# Patient Record
Sex: Female | Born: 1940
Health system: Southern US, Community
[De-identification: ages and names within clinical notes are randomized; demographics above are authoritative.]

## PROBLEM LIST (undated history)

## (undated) DIAGNOSIS — K635 Polyp of colon: Secondary | ICD-10-CM

## (undated) DIAGNOSIS — E119 Type 2 diabetes mellitus without complications: Secondary | ICD-10-CM

## (undated) DIAGNOSIS — Z9289 Personal history of other medical treatment: Secondary | ICD-10-CM

## (undated) DIAGNOSIS — E039 Hypothyroidism, unspecified: Secondary | ICD-10-CM

## (undated) DIAGNOSIS — E785 Hyperlipidemia, unspecified: Secondary | ICD-10-CM

## (undated) DIAGNOSIS — D126 Benign neoplasm of colon, unspecified: Secondary | ICD-10-CM

## (undated) DIAGNOSIS — I10 Essential (primary) hypertension: Secondary | ICD-10-CM

## (undated) HISTORY — DX: Benign neoplasm of colon, unspecified: D12.6

## (undated) HISTORY — DX: Hyperlipidemia, unspecified: E78.5

## (undated) HISTORY — DX: Personal history of other medical treatment: Z92.89

## (undated) HISTORY — DX: Essential (primary) hypertension: I10

## (undated) HISTORY — DX: Hypothyroidism, unspecified: E03.9

---

## 1973-06-25 HISTORY — PX: APPENDECTOMY: SHX54

## 1973-06-25 HISTORY — PX: ABDOMINAL HYSTERECTOMY: SHX81

## 2009-04-25 DIAGNOSIS — D126 Benign neoplasm of colon, unspecified: Secondary | ICD-10-CM

## 2009-04-25 HISTORY — DX: Benign neoplasm of colon, unspecified: D12.6

## 2009-04-30 LAB — HM COLONOSCOPY

## 2009-05-10 ENCOUNTER — Ambulatory Visit: Payer: Self-pay | Admitting: Gastroenterology

## 2009-07-07 ENCOUNTER — Inpatient Hospital Stay: Payer: Self-pay | Admitting: Specialist

## 2009-09-12 ENCOUNTER — Ambulatory Visit: Payer: Self-pay | Admitting: Gastroenterology

## 2009-09-23 LAB — HM COLONOSCOPY: HM Colonoscopy: NORMAL

## 2010-05-04 LAB — HM COLONOSCOPY

## 2010-10-24 DIAGNOSIS — Z9289 Personal history of other medical treatment: Secondary | ICD-10-CM

## 2010-10-24 HISTORY — DX: Personal history of other medical treatment: Z92.89

## 2010-11-02 ENCOUNTER — Ambulatory Visit: Payer: Self-pay | Admitting: Internal Medicine

## 2010-12-24 LAB — HM MAMMOGRAPHY: HM Mammogram: NORMAL

## 2011-03-07 ENCOUNTER — Other Ambulatory Visit: Payer: Self-pay | Admitting: Internal Medicine

## 2011-03-08 MED ORDER — SIMVASTATIN 40 MG PO TABS: 40.0000 mg | ORAL_TABLET | Freq: Every evening | ORAL | Status: DC

## 2011-03-08 MED ORDER — LEVOTHYROXINE SODIUM 88 MCG PO TABS: 88.0000 ug | ORAL_TABLET | Freq: Every day | ORAL | Status: DC

## 2011-03-08 MED ORDER — LISINOPRIL 20 MG PO TABS: 20.0000 mg | ORAL_TABLET | Freq: Every day | ORAL | Status: DC

## 2011-07-12 ENCOUNTER — Telehealth: Payer: Self-pay | Admitting: *Deleted

## 2011-07-12 NOTE — Telephone Encounter (Signed)
Fax from [facility] is in your red folder.  They are asking to change from brand synthroid to levothyroxine.

## 2011-07-13 NOTE — Telephone Encounter (Signed)
Form faxed back to [facility]. 

## 2011-09-24 ENCOUNTER — Ambulatory Visit (INDEPENDENT_AMBULATORY_CARE_PROVIDER_SITE_OTHER): Payer: Medicare Other | Admitting: Internal Medicine

## 2011-09-24 ENCOUNTER — Encounter: Payer: Self-pay | Admitting: Internal Medicine

## 2011-09-24 VITALS — BP 146/62 | HR 118 | Temp 99.0°F | Resp 18 | Ht 66.0 in | Wt 202.2 lb

## 2011-09-24 DIAGNOSIS — I10 Essential (primary) hypertension: Secondary | ICD-10-CM | POA: Insufficient documentation

## 2011-09-24 DIAGNOSIS — J069 Acute upper respiratory infection, unspecified: Secondary | ICD-10-CM | POA: Insufficient documentation

## 2011-09-24 DIAGNOSIS — J329 Chronic sinusitis, unspecified: Secondary | ICD-10-CM

## 2011-09-24 DIAGNOSIS — E785 Hyperlipidemia, unspecified: Secondary | ICD-10-CM | POA: Insufficient documentation

## 2011-09-24 DIAGNOSIS — Z79899 Other long term (current) drug therapy: Secondary | ICD-10-CM

## 2011-09-24 DIAGNOSIS — E039 Hypothyroidism, unspecified: Secondary | ICD-10-CM | POA: Insufficient documentation

## 2011-09-24 DIAGNOSIS — E669 Obesity, unspecified: Secondary | ICD-10-CM

## 2011-09-24 MED ORDER — GUAIFENESIN-CODEINE 100-10 MG/5ML PO SYRP: 5.0000 mL | ORAL_SOLUTION | Freq: Three times a day (TID) | ORAL | Status: AC | PRN

## 2011-09-24 MED ORDER — AMOXICILLIN-POT CLAVULANATE 875-125 MG PO TABS: 1.0000 | ORAL_TABLET | Freq: Two times a day (BID) | ORAL | Status: AC

## 2011-09-24 NOTE — Progress Notes (Signed)
Patient ID: Marissa Rodriguez, female   DOB: [birth year], 71 y.o.   MRN: [id number]    Patient Active Problem List  Diagnoses  . Hyperlipidemia  . Upper respiratory infection  . Hypertension  . Hypothyroidism  . Other and unspecified hyperlipidemia    Subjective:  CC:   Chief Complaint  Patient presents with  . Cough    HPI:   Marissa Rodriguez a 70 y.o. female who presents   4 days of malaise, subjective fevers in late afternoon, hard cough with post tussive emesis, no appetite,  Some productive cough. Sinus congestion,  Some drainage and PND.  Positive sick contacts.  Young Information systems manager.  Taking Delsym and Mucinex DM without relief.  Coughing every 30 seconds.  Cough is  Keeping her up at night .     Past Medical History  Diagnosis Date  . Hypertension   . Hyperlipidemia     Past Surgical History  Procedure Date  . Appendectomy 1975  . Abdominal hysterectomy 1975         The following portions of the patient's history were reviewed and updated as appropriate: Allergies, current medications, and problem list.    Review of Systems:   12 Pt  review of systems was negative except those addressed in the HPI,     History   Social History  . Marital Status: Married    Spouse Name: N/A    Number of Children: N/A  . Years of Education: N/A   Occupational History  . Not on file.   Social History Main Topics  . Smoking status: Never Smoker   . Smokeless tobacco: Never Used  . Alcohol Use: No  . Drug Use: No  . Sexually Active: Not on file   Other Topics Concern  . Not on file   Social History Narrative  . No narrative on file    Objective:  BP 146/62  Pulse 118  Temp(Src) 99 F (37.2 C) (Oral)  Resp 18  Ht 5\' 6"  (1.676 m)  Wt 202 lb 4 oz (91.74 kg)  BMI 32.64 kg/m2  SpO2 96%  General appearance: alert, cooperative and appears stated age Ears: normal TM's and external ear canals both ears Throat: lips, mucosa, and tongue normal; teeth and gums  normal Neck: no adenopathy, no carotid bruit, supple, symmetrical, trachea midline and thyroid not enlarged, symmetric, no tenderness/mass/nodules Back: symmetric, no curvature. ROM normal. No CVA tenderness. Lungs: clear to auscultation bilaterally Heart: regular rate and rhythm, S1, S2 normal, no murmur, click, rub or gallop Abdomen: soft, non-tender; bowel sounds normal; no masses,  no organomegaly Pulses: 2+ and symmetric Skin: Skin color, texture, turgor normal. No rashes or lesions Lymph nodes: Cervical, supraclavicular, and axillary nodes normal.  Assessment and Plan:  Upper respiratory infection Exam is consistent with viral etiology currently. Will treat with suportive care and cheratussing cough syrup to manage cough.  Add augmentin if no improvement in 2-3 days   Other and unspecified hyperlipidemia She has been taking a statin for over eight months without follow up liver function tests. Goal LDL is < 100  Hypothyroidism She needs a TSH level checked as it has been 8 months   Hypertension Not under good control today but she is acutely ill.  Will return in 3 months for repeat evaluation .,    Updated Medication List Outpatient Encounter Prescriptions as of 09/24/2011  Medication Sig Dispense Refill  . levothyroxine (SYNTHROID) 88 MCG tablet Take 1 tablet (88 mcg total) by mouth daily.  90 tablet  3  . lisinopril (PRINIVIL,ZESTRIL) 20 MG tablet Take 1 tablet (20 mg total) by mouth daily.  90 tablet  3  . simvastatin (ZOCOR) 40 MG tablet Take 1 tablet (40 mg total) by mouth every evening.  90 tablet  3  . amoxicillin-clavulanate (AUGMENTIN) 875-125 MG per tablet Take 1 tablet by mouth 2 (two) times daily.  14 tablet  0  . guaiFENesin-codeine (ROBITUSSIN AC) 100-10 MG/5ML syrup Take 5 mLs by mouth 3 (three) times daily as needed for cough.  240 mL  0     Orders Placed This Encounter  Procedures  . MyChart Weight Flowsheet  . HM MAMMOGRAPHY  . TSH  . Lipid panel  .  COMPLETE METABOLIC PANEL WITH GFR  . HM COLONOSCOPY    Return in about 2 months (around 11/24/2011).

## 2011-09-24 NOTE — Assessment & Plan Note (Signed)
Not under good control today but she is acutely ill.  Will return in 3 months for repeat evaluation .,

## 2011-09-24 NOTE — Assessment & Plan Note (Signed)
She has been taking a statin for over eight months without follow up liver function tests. Goal LDL is < 100

## 2011-09-24 NOTE — Patient Instructions (Signed)
You have a viral  Syndrome .  The post nasal drip is causing your sore throat.  Lavage your sinuses twice daly with Simply saline nasal spray.  Use benadryl 25 mg every 8 hours and Sudafed PE 10 to 30 every 8 hours to manage the drainage and congestion.  Gargle with salt water often for the sore throat.  I am calling in Cheratussin cough syrup (has codeine) for the cough.  If the cough is no better  In 3 to 4 days OR  if you develop T > 100.4,  Green or brown nasal discharge,  Or facial pain, start the antibiotic.

## 2011-09-24 NOTE — Assessment & Plan Note (Signed)
Exam is consistent with viral etiology currently. Will treat with suportive care and cheratussing cough syrup to manage cough.  Add augmentin if no improvement in 2-3 days

## 2011-09-24 NOTE — Assessment & Plan Note (Signed)
She needs a TSH level checked as it has been 8 months

## 2011-10-02 ENCOUNTER — Emergency Department: Payer: Self-pay | Admitting: Emergency Medicine

## 2011-10-02 LAB — COMPREHENSIVE METABOLIC PANEL WITH GFR
Albumin: 3.3 g/dL — ABNORMAL LOW
Alkaline Phosphatase: 69 U/L
Anion Gap: 9
BUN: 22 mg/dL — ABNORMAL HIGH
Bilirubin,Total: 0.2 mg/dL
Calcium, Total: 9.5 mg/dL
Chloride: 106 mmol/L
Co2: 28 mmol/L
Creatinine: 1.11 mg/dL
EGFR (African American): >60
EGFR (Non-African Amer.): 52 — ABNORMAL LOW
Glucose: 106 mg/dL — ABNORMAL HIGH
Osmolality: 289
Potassium: 4.2 mmol/L
SGOT(AST): 30 U/L
SGPT (ALT): 23 U/L
Sodium: 143 mmol/L
Total Protein: 8.8 g/dL — ABNORMAL HIGH

## 2011-10-02 LAB — CBC
HCT: 37.4 %
HGB: 12.5 g/dL
MCH: 28.8 pg
MCHC: 33.4 g/dL
MCV: 86 fL
Platelet: 388 10*3/uL
RBC: 4.34 X10 6/mm 3
RDW: 12.4 %
WBC: 9.7 10*3/uL

## 2011-10-02 LAB — TROPONIN I: Troponin-I: <0.02 ng/mL

## 2011-10-11 ENCOUNTER — Encounter: Payer: Self-pay | Admitting: Internal Medicine

## 2011-10-11 ENCOUNTER — Ambulatory Visit (INDEPENDENT_AMBULATORY_CARE_PROVIDER_SITE_OTHER): Payer: Medicare Other | Admitting: Internal Medicine

## 2011-10-11 VITALS — BP 154/76 | HR 80 | Temp 97.6°F | Resp 18 | Wt 196.2 lb

## 2011-10-11 DIAGNOSIS — J189 Pneumonia, unspecified organism: Secondary | ICD-10-CM

## 2011-10-11 DIAGNOSIS — E039 Hypothyroidism, unspecified: Secondary | ICD-10-CM

## 2011-10-11 DIAGNOSIS — I8510 Secondary esophageal varices without bleeding: Secondary | ICD-10-CM

## 2011-10-11 DIAGNOSIS — R05 Cough: Secondary | ICD-10-CM

## 2011-10-11 DIAGNOSIS — E669 Obesity, unspecified: Secondary | ICD-10-CM

## 2011-10-11 DIAGNOSIS — R059 Cough, unspecified: Secondary | ICD-10-CM

## 2011-10-11 DIAGNOSIS — J069 Acute upper respiratory infection, unspecified: Secondary | ICD-10-CM

## 2011-10-11 MED ORDER — PREDNISONE (PAK) 10 MG PO TABS: ORAL_TABLET | ORAL | Status: AC

## 2011-10-11 MED ORDER — HYDROCOD POLST-CHLORPHEN POLST 10-8 MG/5ML PO LQCR: 5.0000 mL | Freq: Two times a day (BID) | ORAL | Status: DC | PRN

## 2011-10-11 MED ORDER — DOXYCYCLINE HYCLATE 100 MG PO TABS: 100.0000 mg | ORAL_TABLET | Freq: Two times a day (BID) | ORAL | Status: AC

## 2011-10-11 NOTE — Patient Instructions (Signed)
I am prescribing an antibiotic (doxycycline) and prednisone taper  To manage the infection and the inflammation in your ear/sinuses.   I also advise use of the following OTC meds to help with your other symptoms.   Take generic OTC benadryl 25 mg every 8 hours for the  post nasal drip,  Sudafed PE  10 to 30 mg every 8 hours for the congestion, you may substitute Afrin nasal spray for the nighttime dose of sudafed PE  If needed to prevent insomnia.  flushes your sinuses twice daily with Simply Saline (do over the sink because if you do it right you will spit out globs of mucus)  Use  tussionex  twice daily for the cough (it is sedating)  1 teaspoon every 12 hours    Gargle with salt water as needed for sore throat.

## 2011-10-11 NOTE — Progress Notes (Signed)
Patient ID: Marissa Rodriguez, female   DOB: [birth year], 71 y.o.   MRN: [id number]  Patient Active Problem List  Diagnoses  . Hyperlipidemia  . Upper respiratory infection  . Hypertension  . Hypothyroidism  . Other and unspecified hyperlipidemia  . History of MRI of brain and brain stem  . Tubular adenoma of colon  . Pneumonia  . Obesity (BMI 30-39.9)  . Secondary esophageal varices without bleeding    Subjective:  CC:   Chief Complaint  Patient presents with  . Follow-up    HPI:   Marissa Rodriguez a 71 y.o. female who presents Follow up on RML pneumonia, diagnosed in ER with CXR on April 9th.  After symptoms of viral upper respiratory infection progressed despite use of Augmentin prescribed by me for progression of symptoms.  her vital signs are stable so she was not admitted but was discharged from ER with a prescription for Levaquin,  which she has finished taking. She feels generally better. She continues to have low-grade fevers and productive cough as well as wheezing..  she is not endorsing any pleuritic chest pain and is not short of breath. She continues to have persistent recurrent cough, which is at times productive accompanied by sinus congestion, malaise and hoarseness.   Past Medical History  Diagnosis Date  . Hypertension   . Hyperlipidemia   . History of MRI of brain and brain stem May 2012    normal MRI/MRA done to rule out CVA  . Tubular adenoma of colon Nov 2010    repeat due 2015  . Hypothyroidism     Past Surgical History  Procedure Date  . Abdominal hysterectomy 1975    TAH/LSO, wedge resection of right  . Appendectomy 1975    incidental, done during TAH         The following portions of the patient's history were reviewed and updated as appropriate: Allergies, current medications, and problem list.    Review of Systems:   12 Pt  review of systems was negative except those addressed in the HPI,     History   Social History  . Marital  Status: Married    Spouse Name: N/A    Number of Children: N/A  . Years of Education: N/A   Occupational History  . Not on file.   Social History Main Topics  . Smoking status: Never Smoker   . Smokeless tobacco: Never Used  . Alcohol Use: No  . Drug Use: No  . Sexually Active: Not on file   Other Topics Concern  . Not on file   Social History Narrative  . No narrative on file    Objective:  BP 154/76  Pulse 80  Temp(Src) 97.6 F (36.4 C) (Oral)  Resp 18  Wt 196 lb 4 oz (89.018 kg)  SpO2 95%  General appearance: alert, cooperative and appears stated age Ears: normal TM's and external ear canals both ears Throat: lips, mucosa, and tongue normal; teeth and gums normal Neck: no adenopathy, no carotid bruit, supple, symmetrical, trachea midline and thyroid not enlarged, symmetric, no tenderness/mass/nodules Back: symmetric, no curvature. ROM normal. No CVA tenderness. Lungs: clear to auscultation bilaterally Heart: regular rate and rhythm, S1, S2 normal, no murmur, click, rub or gallop Abdomen: soft, non-tender; bowel sounds normal; no masses,  no organomegaly Pulses: 2+ and symmetric Skin: Skin color, texture, turgor normal. No rashes or lesions Lymph nodes: Cervical, supraclavicular, and axillary nodes normal.  Assessment and Plan:  Upper respiratory infection Review of  the evaluation of April 9 in the ER was notable for a normal CBC and a RML infiltrate. She has finished the levaquin but continues to havre productive cough and malaise.  Prescribed doxycycline and advised to saline lavage, saline gargles and Benadryl/Sudafed PE for management of sinus congestion  Pneumonia Right middle lobe infiltrate by April 9 chest x-ray done at [facility]. She will need a repeat chest x-ray in 6 weeks to ensure resolution.  Obesity (BMI 30-39.9) She continues to lose weight steadily using dietary modifications and regular exercise using the [employer] lifestyle community exercise  program. She has lost 6 pounds since her visit in June. Encouragement provide;d low glycemic index diet reviewed  Hypothyroidism Managed with generic Synthroid. TSH is due.    Updated Medication List Outpatient Encounter Prescriptions as of 10/11/2011  Medication Sig Dispense Refill  . levothyroxine (SYNTHROID) 88 MCG tablet Take 1 tablet (88 mcg total) by mouth daily.  90 tablet  3  . lisinopril (PRINIVIL,ZESTRIL) 20 MG tablet Take 1 tablet (20 mg total) by mouth daily.  90 tablet  3  . simvastatin (ZOCOR) 40 MG tablet Take 1 tablet (40 mg total) by mouth every evening.  90 tablet  3  . chlorpheniramine-HYDROcodone (TUSSIONEX PENNKINETIC ER) 10-8 MG/5ML LQCR Take 5 mLs by mouth every 12 (twelve) hours as needed.  200 mL  1  . doxycycline (VIBRA-TABS) 100 MG tablet Take 1 tablet (100 mg total) by mouth 2 (two) times daily.  14 tablet  0  . predniSONE (STERAPRED UNI-PAK) 10 MG tablet 6 tablets on Day 1 , then reduce by 1 tablet daily until gone  21 tablet  0     Orders Placed This Encounter  Procedures  . HM COLONOSCOPY    No Follow-up on file.

## 2011-10-14 ENCOUNTER — Encounter: Payer: Self-pay | Admitting: Internal Medicine

## 2011-10-14 DIAGNOSIS — I8510 Secondary esophageal varices without bleeding: Secondary | ICD-10-CM

## 2011-10-14 DIAGNOSIS — D126 Benign neoplasm of colon, unspecified: Secondary | ICD-10-CM | POA: Insufficient documentation

## 2011-10-14 DIAGNOSIS — Z9289 Personal history of other medical treatment: Secondary | ICD-10-CM | POA: Insufficient documentation

## 2011-10-14 DIAGNOSIS — Z8701 Personal history of pneumonia (recurrent): Secondary | ICD-10-CM | POA: Insufficient documentation

## 2011-10-14 DIAGNOSIS — E669 Obesity, unspecified: Secondary | ICD-10-CM | POA: Insufficient documentation

## 2011-10-14 HISTORY — DX: Secondary esophageal varices without bleeding: I85.10

## 2011-10-14 NOTE — Assessment & Plan Note (Signed)
She continues to lose weight steadily using dietary modifications and regular exercise using the [employer] lifestyle community exercise program. She has lost 6 pounds since her visit in June. Encouragement provide;d low glycemic index diet reviewed

## 2011-10-14 NOTE — Assessment & Plan Note (Signed)
Review of the evaluation of April 9 in the ER was notable for a normal CBC and a RML infiltrate. She has finished the levaquin but continues to havre productive cough and malaise.  Prescribed doxycycline and advised to saline lavage, saline gargles and Benadryl/Sudafed PE for management of sinus congestion

## 2011-10-14 NOTE — Assessment & Plan Note (Signed)
Managed with generic Synthroid. TSH is due.

## 2011-10-14 NOTE — Assessment & Plan Note (Signed)
Right middle lobe infiltrate by April 9 chest x-ray done at [facility]. She will need a repeat chest x-ray in 6 weeks to ensure resolution.

## 2011-10-18 ENCOUNTER — Encounter: Payer: Self-pay | Admitting: Internal Medicine

## 2012-03-26 ENCOUNTER — Other Ambulatory Visit: Payer: Self-pay

## 2012-04-02 ENCOUNTER — Other Ambulatory Visit: Payer: Self-pay | Admitting: Internal Medicine

## 2012-04-02 NOTE — Telephone Encounter (Signed)
Refill on Simvastatin 40 mg ,levothyroxin 88 mcg, lisinopril 20 mg Send to [facility]	[place].

## 2012-04-07 MED ORDER — LISINOPRIL 20 MG PO TABS: 20.0000 mg | ORAL_TABLET | Freq: Every day | ORAL | Status: DC

## 2012-04-07 MED ORDER — SIMVASTATIN 40 MG PO TABS: 40.0000 mg | ORAL_TABLET | Freq: Every day | ORAL | Status: DC

## 2012-04-07 MED ORDER — LEVOTHYROXINE SODIUM 88 MCG PO TABS: 88.0000 ug | ORAL_TABLET | Freq: Every day | ORAL | Status: DC

## 2012-04-07 NOTE — Telephone Encounter (Signed)
Rx's sent to [facility] 

## 2012-07-25 ENCOUNTER — Other Ambulatory Visit: Payer: Self-pay | Admitting: Internal Medicine

## 2012-07-25 MED ORDER — LISINOPRIL 20 MG PO TABS: 20.0000 mg | ORAL_TABLET | Freq: Every day | ORAL | Status: DC

## 2012-07-25 NOTE — Telephone Encounter (Signed)
Pt is needing refill on B/P meds she uses [facility]

## 2012-07-25 NOTE — Telephone Encounter (Signed)
meds filled

## 2012-09-09 ENCOUNTER — Encounter: Payer: Self-pay | Admitting: Internal Medicine

## 2012-09-09 ENCOUNTER — Ambulatory Visit (INDEPENDENT_AMBULATORY_CARE_PROVIDER_SITE_OTHER): Payer: Medicare Other | Admitting: Internal Medicine

## 2012-09-09 VITALS — BP 154/76 | HR 73 | Temp 98.0°F | Resp 16 | Ht 63.5 in | Wt 196.5 lb

## 2012-09-09 DIAGNOSIS — R5383 Other fatigue: Secondary | ICD-10-CM

## 2012-09-09 DIAGNOSIS — Z0000 Encounter for general adult medical examination without abnormal findings: Secondary | ICD-10-CM | POA: Insufficient documentation

## 2012-09-09 DIAGNOSIS — R5381 Other malaise: Secondary | ICD-10-CM

## 2012-09-09 DIAGNOSIS — Z1239 Encounter for other screening for malignant neoplasm of breast: Secondary | ICD-10-CM

## 2012-09-09 DIAGNOSIS — Z8701 Personal history of pneumonia (recurrent): Secondary | ICD-10-CM

## 2012-09-09 DIAGNOSIS — E039 Hypothyroidism, unspecified: Secondary | ICD-10-CM

## 2012-09-09 DIAGNOSIS — D126 Benign neoplasm of colon, unspecified: Secondary | ICD-10-CM

## 2012-09-09 DIAGNOSIS — E559 Vitamin D deficiency, unspecified: Secondary | ICD-10-CM

## 2012-09-09 DIAGNOSIS — E669 Obesity, unspecified: Secondary | ICD-10-CM

## 2012-09-09 DIAGNOSIS — I10 Essential (primary) hypertension: Secondary | ICD-10-CM

## 2012-09-09 DIAGNOSIS — Z79899 Other long term (current) drug therapy: Secondary | ICD-10-CM

## 2012-09-09 DIAGNOSIS — E785 Hyperlipidemia, unspecified: Secondary | ICD-10-CM

## 2012-09-09 LAB — BASIC METABOLIC PANEL WITH GFR
BUN: 17 mg/dL (ref 6–23)
CO2: 25 meq/L (ref 19–32)
Calcium: 9.5 mg/dL (ref 8.4–10.5)
Chloride: 99 meq/L (ref 96–112)
Creatinine, Ser: 0.9 mg/dL (ref 0.4–1.2)
GFR: 62.45 mL/min
Glucose, Bld: 139 mg/dL — ABNORMAL HIGH (ref 70–99)
Potassium: 4.4 meq/L (ref 3.5–5.1)
Sodium: 135 meq/L (ref 135–145)

## 2012-09-09 LAB — COMPREHENSIVE METABOLIC PANEL WITH GFR
ALT: 23 U/L (ref 0–35)
AST: 24 U/L (ref 0–37)
Albumin: 4.3 g/dL (ref 3.5–5.2)
Alkaline Phosphatase: 51 U/L (ref 39–117)
BUN: 17 mg/dL (ref 6–23)
CO2: 25 meq/L (ref 19–32)
Calcium: 9.5 mg/dL (ref 8.4–10.5)
Chloride: 99 meq/L (ref 96–112)
Creatinine, Ser: 0.9 mg/dL (ref 0.4–1.2)
GFR: 62.45 mL/min
Glucose, Bld: 139 mg/dL — ABNORMAL HIGH (ref 70–99)
Potassium: 4.4 meq/L (ref 3.5–5.1)
Sodium: 135 meq/L (ref 135–145)
Total Bilirubin: 0.5 mg/dL (ref 0.3–1.2)
Total Protein: 8.2 g/dL (ref 6.0–8.3)

## 2012-09-09 LAB — CBC WITH DIFFERENTIAL/PLATELET
Basophils Absolute: 0 10*3/uL (ref 0.0–0.1)
Basophils Relative: 0.6 % (ref 0.0–3.0)
Eosinophils Absolute: 0.2 10*3/uL (ref 0.0–0.7)
Eosinophils Relative: 2.9 % (ref 0.0–5.0)
HCT: 41.2 % (ref 36.0–46.0)
Hemoglobin: 14 g/dL (ref 12.0–15.0)
Lymphocytes Relative: 26.3 % (ref 12.0–46.0)
Lymphs Abs: 2.1 10*3/uL (ref 0.7–4.0)
MCHC: 34 g/dL (ref 30.0–36.0)
MCV: 85.8 fl (ref 78.0–100.0)
Monocytes Absolute: 0.6 10*3/uL (ref 0.1–1.0)
Monocytes Relative: 7.2 % (ref 3.0–12.0)
Neutro Abs: 5.1 10*3/uL (ref 1.4–7.7)
Neutrophils Relative %: 63 % (ref 43.0–77.0)
Platelets: 266 10*3/uL (ref 150.0–400.0)
RBC: 4.8 Mil/uL (ref 3.87–5.11)
RDW: 13.2 % (ref 11.5–14.6)
WBC: 8.1 10*3/uL (ref 4.5–10.5)

## 2012-09-09 LAB — LIPID PANEL
Cholesterol: 176 mg/dL (ref 0–200)
HDL: 32.9 mg/dL — ABNORMAL LOW
LDL Cholesterol: 104 mg/dL — ABNORMAL HIGH (ref 0–99)
Total CHOL/HDL Ratio: 5
Triglycerides: 194 mg/dL — ABNORMAL HIGH (ref 0.0–149.0)
VLDL: 38.8 mg/dL (ref 0.0–40.0)

## 2012-09-09 LAB — TSH: TSH: 2.39 u[IU]/mL (ref 0.35–5.50)

## 2012-09-09 MED ORDER — TETANUS-DIPHTH-ACELL PERTUSSIS 5-2.5-18.5 LF-MCG/0.5 IM SUSP: 0.5000 mL | Freq: Once | INTRAMUSCULAR | Status: DC

## 2012-09-09 MED ORDER — LISINOPRIL-HYDROCHLOROTHIAZIDE 20-25 MG PO TABS: 1.0000 | ORAL_TABLET | Freq: Every day | ORAL | Status: DC

## 2012-09-09 NOTE — Patient Instructions (Addendum)
Mammogram to be ordered  Changing your bp medication from lisinopril to lisinopril/hct  One tablet daily  Return about a week after changing to check bp and repeat sodium/potassium check (labs)   PLEASE BRING YOUR HOME BP CUFF WITH YOU!  --------------------------------------------------------------------------------- I would like you to lose  20 lbs,  Or 10%  Of your current body weight over the next 6 months  With diet and exercise   This is  One version of a  "Low GI"  Diet:  It will still lower your blood sugars and allow you to lose 4 to 8  lbs  per month if you follow it carefully and combine it with 30 minutes of aerobic exercise 5 days per week .   All of the foods can be found at grocery stores and in bulk at [employer].  The Atkins protein bars and shakes are available in more varieties at [employer], [employer] and [employer].     7 AM Breakfast:  Choose from the following:  Low carbohydrate Protein  Shakes (I recommend the [employer] "Carb Control" shakes  Or the low carb shakes by Atkins.    2.5 carbs   Arnold's "Sandwhich Thin"toasted  w/ peanut butter (no jelly: about 20 net carbs  "Bagel Thin" with cream cheese and salmon: about 20 carbs   a scrambled egg/bacon/cheese burrito made with Mission's "carb balance" whole wheat tortilla  (about 10 net carbs )   Avoid cereal and bananas, oatmeal and cream of wheat and grits. They are loaded with carbohydrates!   10 AM: high protein snack  Protein bar by Atkins (the snack size, under 200 cal, usually < 6 net carbs).    A stick of cheese:  Around 1 carb,  100 cal     Dannon Light n Fit [place] Yogurt  (80 cal, 8 carbs)  Other so called "protein bars" and Greek yogurts tend to be loaded with carbohydrates.  Remember, in food advertising, the word "energy" is synonymous for " carbohydrate."  Lunch:   A Sandwich using the bread choices listed, Can use any  Eggs,  lunchmeat, grilled meat or canned tuna), avocado, regular mayo/mustard   and cheese.  A Salad using clue cheese, ranch,  Goddess or vinagrette,  No croutons or "confetti" and no "candied nuts" but regular nuts OK.   No pretzels or chips.  Pickles and miniature sweet peppers are a good low carb alternative  The bread is the only source or carbohydrate that can be decreased (Joseph's makes a pita bread and a flat bread that are 50 cal and 4 net carbs ; Toufayan makes a low carb flatbread that's 100 cal and 9 net carbs  and  Mission's carb balance whole wheat tortilla  That is 210 cal and 6 net carbs) Avoid "Low fat dressings, as well as Reyne Dumas and [address] dressings They are loaded with sugar!   3 PM/ Mid day  Snack:  Consider  1 ounce of  almonds, walnuts, pistachios, pecans, peanuts,  Macadamia nuts or a nut medley.  Avoid "granola"; the dried cranberries and raisins are loaded with carbohydrates. Mixed nuts ok if no raisins or cranberries or dried fruit.     6 PM  Dinner:    "mean and green, "  Meat/chicken/fish with a green salad, and broccoli, cauliflower, green beans, spinach, brussel sprouts or  Lima beans::       There is a low carb pasta by Dreamfield's available at [employer] that is  acceptable and tastes great only 5 diestible carbs/serving.   Try Michel Angelo's chicken piccata or chicken or eggplant parm over low carb pasta.   Clifton Custard Sanchez's "Carnitas" (pulled pork, no sauce,  0 carbs) or his beef pot roast to make a dinner burrito  Whole wheat pasta is still full of digestible carbs and  Not as low in glycemic index as Dreamfield's.   Brown rice is still rice,  So skip the rice and noodles if you eat [place] or [place]  9 PM snack :   Breyer's "low carb" fudgsicle or  ice cream bar (Carb Smart line), or  Weight Watcher's ice cream bar , or another "no sugar added" ice cream;  a serving of fresh berries/cherries with whipped cream   Cheese or yoguty  Avoid bananas, pineapple, grapes  and watermelon on a regular basis because they are high  in sugar)   Remember that snack Substitutions should be less than 10 carbs per serving and meals < 20 carbs. Remember to subtract fiber grams to get the "net carbs."

## 2012-09-09 NOTE — Assessment & Plan Note (Signed)
Lungs are normal on exam today

## 2012-09-09 NOTE — Progress Notes (Signed)
Patient ID: Marissa Rodriguez, female   DOB: [birth year], 72 y.o.   MRN: [id number]   The patient is here for annual Medicare wellness examination and management of other chronic and acute problems as follows: 1) Emotional  Stress.  Family husband's health and care of grandchildren every other week .  2) HM:  Overdue for pelvic, mammo,  Labs. Refuses flu shot.  Had pneumonia vaccine during hospitalization for colitis last year  Needs TdaP.   3) Obesity presumed cirrhoses secondary to NASH: Has gained weight .  Esophageal varices noted on 2011 imaging during admission for colitis.  4) HTN:  She has an Elevated bp also noted at home with elevations to 200 at times using wrist cuff     The risk factors are reflected in the social history.  The roster of all physicians providing medical care to patient - is listed in the Snapshot section of the chart.  Activities of daily living:  The patient is 100% independent in all ADLs: dressing, toileting, feeding as well as independent mobility  Home safety : The patient has smoke detectors in the home. They wear seatbelts.  There are no firearms at home. There is no violence in the home.   There is no risks for hepatitis, STDs or HIV. There is no   history of blood transfusion. They have no travel history to infectious disease endemic areas of the world.  The patient has seen their dentist in the last six month. They have seen their eye doctor in the last year. They admit to slight hearing difficulty with regard to whispered voices and some television programs.  They have deferred audiologic testing in the last year.  They do not  have excessive sun exposure. Discussed the need for sun protection: hats, long sleeves and use of sunscreen if there is significant sun exposure.   Diet: the importance of a healthy diet is discussed. They do have a healthy diet.  The benefits of regular aerobic exercise were discussed. She does not exercise regularly.   Depression  screen: there are no signs or vegative symptoms of depression- irritability, change in appetite, anhedonia, sadness/tearfullness.  Cognitive assessment: the patient manages all their financial and personal affairs and is actively engaged. They could relate day,date,year and events; recalled 2/3 objects at 3 minutes; performed clock-face test normally.  The following portions of the patient's history were reviewed and updated as appropriate: allergies, current medications, past family history, past medical history,  past surgical history, past social history  and problem list.  Visual acuity was not assessed per patient preference since she has regular follow up with her ophthalmologist. Hearing and body mass index were assessed and reviewed.   During the course of the visit the patient was educated and counseled about appropriate screening and preventive services including : fall prevention , diabetes screening, nutrition counseling, colorectal cancer screening, and recommended immunizations.   Objective  General Appearance:    Alert, cooperative, no distress, appears stated age  Head:    Normocephalic, without obvious abnormality, atraumatic  Eyes:    PERRL, conjunctiva/corneas clear, EOM's intact, fundi    benign, both eyes  Ears:    Normal TM's and external ear canals, both ears  Nose:   Nares normal, septum midline, mucosa normal, no drainage    or sinus tenderness  Throat:   Lips, mucosa, and tongue normal; teeth and gums normal  Neck:   Supple, symmetrical, trachea midline, no adenopathy;    thyroid:  no enlargement/tenderness/nodules;  no carotid   bruit or JVD  Back:     Symmetric, no curvature, ROM normal, no CVA tenderness  Lungs:     Clear to auscultation bilaterally, respirations unlabored  Chest Wall:    No tenderness or deformity   Heart:    Regular rate and rhythm, S1 and S2 normal, no murmur, rub   or gallop  Breast Exam:    No tenderness, masses, or nipple abnormality   Abdomen:     Soft, non-tender, bowel sounds active all four quadrants,    no masses, no organomegaly  Genitalia:    Pelvic: cervix absent, external genitalia normal, no adnexal masses or tenderness, no cervical motion tenderness, rectovaginal septum normal, uterus absent,  vagina normal without discharge  Extremities:   Extremities normal, atraumatic, no cyanosis or edema  Pulses:   2+ and symmetric all extremities  Skin:   Skin color, texture, turgor normal, no rashes or lesions  Lymph nodes:   Cervical, supraclavicular, and axillary nodes normal  Neurologic:   CNII-XII intact, normal strength, sensation and reflexes    throughout   Assessment and Plan  Hypertension Elevated again 150/90 by my recheck left arm.  Reviewed list of meds, patient is not taking OTC meds that could be causing,.will check uirne for protein,  Review renal funciton,.  Adding hctz to lixinopril and return in 2 weeks for BMET ad bp check.   Tubular adenoma of colon Repeat colonoscopy due in 2015.   History of pneumonia Lungs are normal on exam today  Obesity (BMI 30-39.9) With weight gain noted today and suspected NASH. I have addressed  BMI and recommended a low glycemic index diet utilizing smaller more frequent meals to increase metabolism.  I have also recommended that patient start exercising with a goal of 30 minutes of aerobic exercise a minimum of 5 days per week. Screening for lipid disorders, thyroid and diabetes to be done today.    Hyperlipidemia Managed with Zocor,.  Repeat lipids and lfts due.   Routine general medical examination at a health care facility Annual comprehensive exam was done including breast, pelvic. All screenings have been addressed .     Updated Medication List Outpatient Encounter Prescriptions as of 09/09/2012  Medication Sig Dispense Refill  . levothyroxine (SYNTHROID, LEVOTHROID) 88 MCG tablet Take 1 tablet (88 mcg total) by mouth daily.  90 tablet  3  .  lisinopril-hydrochlorothiazide (PRINZIDE,ZESTORETIC) 20-25 MG per tablet Take 1 tablet by mouth daily.  90 tablet  3  . simvastatin (ZOCOR) 40 MG tablet Take 1 tablet (40 mg total) by mouth daily.  90 tablet  3  . TDaP (BOOSTRIX) 5-2.5-18.5 LF-MCG/0.5 injection Inject 0.5 mLs into the muscle once.  0.5 mL  0  . [DISCONTINUED] chlorpheniramine-HYDROcodone (TUSSIONEX PENNKINETIC ER) 10-8 MG/5ML LQCR Take 5 mLs by mouth every 12 (twelve) hours as needed.  200 mL  1  . [DISCONTINUED] levothyroxine (SYNTHROID) 88 MCG tablet Take 1 tablet (88 mcg total) by mouth daily.  90 tablet  3  . [DISCONTINUED] lisinopril (PRINIVIL,ZESTRIL) 20 MG tablet Take 1 tablet (20 mg total) by mouth daily.  90 tablet  3  . [DISCONTINUED] lisinopril (PRINIVIL,ZESTRIL) 20 MG tablet Take 1 tablet (20 mg total) by mouth daily.  90 tablet  1  . [DISCONTINUED] simvastatin (ZOCOR) 40 MG tablet Take 1 tablet (40 mg total) by mouth every evening.  90 tablet  3   No facility-administered encounter medications on file as of 09/09/2012.

## 2012-09-09 NOTE — Assessment & Plan Note (Signed)
Repeat colonoscopy due in 2015.

## 2012-09-09 NOTE — Assessment & Plan Note (Addendum)
Elevated again 150/90 by my recheck left arm.  Reviewed list of meds, patient is not taking OTC meds that could be causing,.will check uirne for protein,  Review renal funciton,.  Adding hctz to lixinopril and return in 2 weeks for BMET ad bp check.

## 2012-09-09 NOTE — Assessment & Plan Note (Signed)
Managed with Zocor,.  Repeat lipids and lfts due.

## 2012-09-09 NOTE — Assessment & Plan Note (Signed)
Annual comprehensive exam was done including breast, pelvic. All screenings have been addressed .  

## 2012-09-09 NOTE — Addendum Note (Signed)
Addended by: Montine Circle D on: 09/09/2012 01:29 PM   Modules accepted: Orders

## 2012-09-09 NOTE — Assessment & Plan Note (Signed)
With weight gain noted today and suspected NASH. I have addressed  BMI and recommended a low glycemic index diet utilizing smaller more frequent meals to increase metabolism.  I have also recommended that patient start exercising with a goal of 30 minutes of aerobic exercise a minimum of 5 days per week. Screening for lipid disorders, thyroid and diabetes to be done today.

## 2012-09-10 ENCOUNTER — Encounter: Payer: Self-pay | Admitting: Internal Medicine

## 2012-09-10 LAB — MICROALBUMIN / CREATININE URINE RATIO: Microalb Creat Ratio: 7.5 mg/g (ref 0.0–30.0)

## 2012-09-15 ENCOUNTER — Telehealth: Payer: Self-pay | Admitting: Internal Medicine

## 2012-09-15 MED ORDER — LEVOTHYROXINE SODIUM 88 MCG PO TABS: 88.0000 ug | ORAL_TABLET | Freq: Every day | ORAL | Status: DC

## 2012-09-15 NOTE — Telephone Encounter (Signed)
levothyroxine (SYNTHROID, LEVOTHROID) 88 MCG tablet  Please send to [facility]

## 2012-09-15 NOTE — Telephone Encounter (Signed)
Med filled due to previous rx discontinued.

## 2012-09-30 ENCOUNTER — Encounter: Payer: Self-pay | Admitting: Internal Medicine

## 2012-12-09 ENCOUNTER — Ambulatory Visit (INDEPENDENT_AMBULATORY_CARE_PROVIDER_SITE_OTHER): Payer: Medicare Other | Admitting: Internal Medicine

## 2012-12-09 ENCOUNTER — Encounter: Payer: Self-pay | Admitting: Internal Medicine

## 2012-12-09 VITALS — BP 132/68 | HR 73 | Temp 97.9°F | Resp 14 | Wt 202.8 lb

## 2012-12-09 DIAGNOSIS — E1169 Type 2 diabetes mellitus with other specified complication: Secondary | ICD-10-CM

## 2012-12-09 DIAGNOSIS — N179 Acute kidney failure, unspecified: Secondary | ICD-10-CM

## 2012-12-09 DIAGNOSIS — E559 Vitamin D deficiency, unspecified: Secondary | ICD-10-CM

## 2012-12-09 DIAGNOSIS — E669 Obesity, unspecified: Secondary | ICD-10-CM

## 2012-12-09 DIAGNOSIS — K7581 Nonalcoholic steatohepatitis (NASH): Secondary | ICD-10-CM

## 2012-12-09 DIAGNOSIS — K7689 Other specified diseases of liver: Secondary | ICD-10-CM

## 2012-12-09 DIAGNOSIS — E119 Type 2 diabetes mellitus without complications: Secondary | ICD-10-CM

## 2012-12-09 DIAGNOSIS — E039 Hypothyroidism, unspecified: Secondary | ICD-10-CM

## 2012-12-09 DIAGNOSIS — I10 Essential (primary) hypertension: Secondary | ICD-10-CM

## 2012-12-09 DIAGNOSIS — I8510 Secondary esophageal varices without bleeding: Secondary | ICD-10-CM

## 2012-12-09 LAB — COMPREHENSIVE METABOLIC PANEL WITH GFR
ALT: 27 U/L (ref 0–35)
AST: 29 U/L (ref 0–37)
Albumin: 4.4 g/dL (ref 3.5–5.2)
Alkaline Phosphatase: 41 U/L (ref 39–117)
BUN: 45 mg/dL — ABNORMAL HIGH (ref 6–23)
CO2: 23 meq/L (ref 19–32)
Calcium: 10.1 mg/dL (ref 8.4–10.5)
Chloride: 104 meq/L (ref 96–112)
Creatinine, Ser: 1.7 mg/dL — ABNORMAL HIGH (ref 0.4–1.2)
GFR: 32.15 mL/min — ABNORMAL LOW
Glucose, Bld: 119 mg/dL — ABNORMAL HIGH (ref 70–99)
Potassium: 4.5 meq/L (ref 3.5–5.1)
Sodium: 139 meq/L (ref 135–145)
Total Bilirubin: 0.7 mg/dL (ref 0.3–1.2)
Total Protein: 8.1 g/dL (ref 6.0–8.3)

## 2012-12-09 LAB — TSH: TSH: 1.23 u[IU]/mL (ref 0.35–5.50)

## 2012-12-09 LAB — LIPID PANEL
Cholesterol: 172 mg/dL (ref 0–200)
HDL: 33.9 mg/dL — ABNORMAL LOW
LDL Cholesterol: 105 mg/dL — ABNORMAL HIGH (ref 0–99)
Total CHOL/HDL Ratio: 5
Triglycerides: 165 mg/dL — ABNORMAL HIGH (ref 0.0–149.0)
VLDL: 33 mg/dL (ref 0.0–40.0)

## 2012-12-09 LAB — HEMOGLOBIN A1C: Hgb A1c MFr Bld: 7.3 % — ABNORMAL HIGH (ref 4.6–6.5)

## 2012-12-09 MED ORDER — METFORMIN HCL 500 MG PO TABS: 500.0000 mg | ORAL_TABLET | Freq: Two times a day (BID) | ORAL | Status: DC

## 2012-12-09 MED ORDER — LISINOPRIL-HYDROCHLOROTHIAZIDE 20-25 MG PO TABS: 1.0000 | ORAL_TABLET | Freq: Every day | ORAL | Status: DC

## 2012-12-09 NOTE — Progress Notes (Signed)
Patient ID: Marissa Rodriguez, female   DOB: [birth year], 72 y.o.   MRN: [id number]   Patient Active Problem List   Diagnosis Date Noted  . NASH (nonalcoholic steatohepatitis) 12/10/2012  . Diabetes mellitus type 2 in obese 12/10/2012  . Acute renal failure 12/10/2012  . Routine general medical examination at a health care facility 09/09/2012  . History of pneumonia 10/14/2011  . Obesity (BMI 30-39.9) 10/14/2011  . Secondary esophageal varices without bleeding 10/14/2011  . History of MRI of brain and brain stem   . Tubular adenoma of colon   . Hypertension 09/24/2011  . Hypothyroidism 09/24/2011  . Hyperlipidemia     Subjective:  CC:   Chief Complaint  Patient presents with  . Follow-up    3 month follow up for DM    HPI:   Marissa Rodriguez a 72 y.o. female who presents for follow up on new onset diabetes.  Diagnosed with fasting blood sugar of 139 last month.  Today's hgba1c is 7.3 .  She reports fatigue despite sleeping well most nights.  She is retired but spends 8 to 10 hours daily caring for mom and grandkids. She reports considerable emotional stress  Including worrying about Ronnie her husband's health problems since he had a triple CABG and was foound to have ILD secondary to pulmonary fibrosis.  Since last month she has been checking fasting blood sugars and reports an average of 140, while her evening sugars have been more labile , averaging 114 to 225  2 hours post prandially. No weight loss     Past Medical History  Diagnosis Date  . Hypertension   . Hyperlipidemia   . History of MRI of brain and brain stem May 2012    normal MRI/MRA done to rule out CVA  . Tubular adenoma of colon Nov 2010    repeat due 2015  . Hypothyroidism     Past Surgical History  Procedure Laterality Date  . Abdominal hysterectomy  1975    TAH/LSO, wedge resection of right  . Appendectomy  1975    incidental, done during TAH       The following portions of the patient's history were  reviewed and updated as appropriate: Allergies, current medications, and problem list.    Review of Systems:  Patient denies headache, fevers, malaise, unintentional weight loss, skin rash, eye pain, sinus congestion and sinus pain, sore throat, dysphagia,  hemoptysis , cough, dyspnea, wheezing, chest pain, palpitations, orthopnea, edema, abdominal pain, nausea, melena, diarrhea, constipation, flank pain, dysuria, hematuria, urinary  Frequency, nocturia, numbness, tingling, seizures,  Focal weakness, Loss of consciousness,  Tremor, insomnia, depression, anxiety, and suicidal ideation.      History   Social History  . Marital Status: Married    Spouse Name: N/A    Number of Children: N/A  . Years of Education: N/A   Occupational History  . Not on file.   Social History Main Topics  . Smoking status: Never Smoker   . Smokeless tobacco: Never Used  . Alcohol Use: No  . Drug Use: No  . Sexually Active: Not on file   Other Topics Concern  . Not on file   Social History Narrative  . No narrative on file    Objective:  BP 132/68  Pulse 73  Temp(Src) 97.9 F (36.6 C) (Oral)  Resp 14  Wt 202 lb 12 oz (91.967 kg)  BMI 35.35 kg/m2  SpO2 98%  General appearance: alert, cooperative and appears stated age Ears: normal  TM's and external ear canals both ears Throat: lips, mucosa, and tongue normal; teeth and gums normal Neck: no adenopathy, no carotid bruit, supple, symmetrical, trachea midline and thyroid not enlarged, symmetric, no tenderness/mass/nodules Back: symmetric, no curvature. ROM normal. No CVA tenderness. Lungs: clear to auscultation bilaterally Heart: regular rate and rhythm, S1, S2 normal, no murmur, click, rub or gallop Abdomen: soft, non-tender; bowel sounds normal; no masses,  no organomegaly Pulses: 2+ and symmetric Skin: Skin color, texture, turgor normal. No rashes or lesions Lymph nodes: Cervical, supraclavicular, and axillary nodes normal.  Assessment  and Plan:  Secondary esophageal varices without bleeding Found during 2011 admission for rectal bleeding secondary to colitis, ischemic versus diverticular bleed. She underwent a CT of the abdomen and pelvis which was concerning for paraesophageal mass which by EGD turn out to be esophageal varices. Varices are presumed to be due to NASH but she has not had liver biopsy. Marland Kitchen   NASH (nonalcoholic steatohepatitis) Suspected given history of esophageal varices during endoscopy in 2011 , with no biopsy or workup done to date.  Will rule out other causes with serologies and refer to GI for biopsy,   Acute renal failure Likely secondary to dehydration given new onset. Repeat in one week  Diabetes mellitus type 2 in obese Consider starting metformin if repeat CR has normalized after stopping hctz  Hypertension Stopping lisinopril hctz due to significant change in renal function.  Will use amlodipine and resume lisinopril    A total of 40 minutes was spent with patient more than half of which was spent in counseling, reviewing records from other providers and coordination of care.  Updated Medication List Outpatient Encounter Prescriptions as of 12/09/2012  Medication Sig Dispense Refill  . levothyroxine (SYNTHROID, LEVOTHROID) 88 MCG tablet Take 1 tablet (88 mcg total) by mouth daily.  90 tablet  2  . simvastatin (ZOCOR) 40 MG tablet Take 1 tablet (40 mg total) by mouth daily.  90 tablet  3  . TDaP (BOOSTRIX) 5-2.5-18.5 LF-MCG/0.5 injection Inject 0.5 mLs into the muscle once.  0.5 mL  0  . [DISCONTINUED] lisinopril-hydrochlorothiazide (PRINZIDE,ZESTORETIC) 20-25 MG per tablet Take 1 tablet by mouth daily.  90 tablet  3  . [DISCONTINUED] lisinopril-hydrochlorothiazide (PRINZIDE,ZESTORETIC) 20-25 MG per tablet Take 1 tablet by mouth daily.  90 tablet  3  . metFORMIN (GLUCOPHAGE) 500 MG tablet Take 1 tablet (500 mg total) by mouth 2 (two) times daily with a meal.  180 tablet  3   No  facility-administered encounter medications on file as of 12/09/2012.     Orders Placed This Encounter  Procedures  . Hemoglobin A1c  . Lipid panel  . Comprehensive metabolic panel  . TSH  . Iron and TIBC  . Anti-Smith antibody  . Hepatitis C antibody  . Hepatitis B surface antigen  . Hepatitis B core antibody, total  . Ferritin  . Hepatitis B surface antibody  . ANA  . Basic metabolic panel    Return in about 3 months (around 03/11/2013).

## 2012-12-09 NOTE — Patient Instructions (Addendum)
If your A1c is < 6.5,  We will not start medication.  If it is > 6.5, I will recommend starting the metformin   Please start taking a baby aspirin daily fr your heart.  You need to lose 20 lbs over the next 3 to 6 months.  This is  One version of a  "Low GI"  Diet:  It will still lower your blood sugars and allow you to lose 4 to 8  lbs  per month if you follow it carefully.  Your goal with exercise is a minimum of 30 minutes of aerobic exercise 5 days per week (Walking does not count once it becomes easy!)    All of the foods can be found at grocery stores and in bulk at [employer].  The Atkins protein bars and shakes are available in more varieties at Target, WalMart and [employer]'s Foods.     7 AM Breakfast:  Choose from the following:  Low carbohydrate Protein  Shakes (I recommend the EAS AdvantEdge "Carb Control" shakes  Or the low carb shakes by Atkins.    2.5 carbs   Arnold's "Sandwhich Thin"toasted  w/ peanut butter (no jelly: about 20 net carbs  "Bagel Thin" with cream cheese and salmon: about 20 carbs   a scrambled egg/bacon/cheese burrito made with Mission's "carb balance" whole wheat tortilla  (about 10 net carbs )   Avoid cereal and bananas, oatmeal and cream of wheat and grits. They are loaded with carbohydrates!   10 AM: high protein snack  Protein bar by Atkins (the snack size, under 200 cal, usually < 6 net carbs).    A stick of cheese:  Around 1 carb,  100 cal     Dannon Light n Fit [place] Yogurt  (80 cal, 8 carbs)  Other so called "protein bars" and Greek yogurts tend to be loaded with carbohydrates.  Remember, in food advertising, the word "energy" is synonymous for " carbohydrate."  Lunch:   A Sandwich using the bread choices listed, Can use any  Eggs,  lunchmeat, grilled meat or canned tuna), avocado, regular mayo/mustard  and cheese.  A Salad using blue cheese, ranch,  Goddess or vinagrette,  No croutons or "confetti" and no "candied nuts" but regular nuts OK.   No  pretzels or chips.  Pickles and miniature sweet peppers are a good low carb alternative that provide a "crunch"  The bread is the only source of carbohydrate in a sandwich and  can be decreased by trying some of these alternatives to traditional loaf bread  Joseph's makes a pita bread and a flat bread that are 50 cal and 4 net carbs available at BJs and WalMart.  This can be toasted to use with hummous as well  Toufayan makes a low carb flatbread that's 100 cal and 9 net carbs available at [employer] and [employer] makes 2 sizes of  Low carb whole wheat tortilla  (The large one is 210 cal and 6 net carbs) Avoid "Low fat dressings, as well as Reyne Dumas and [address] dressings They are loaded with sugar!   3 PM/ Mid day  Snack:  Consider  1 ounce of  almonds, walnuts, pistachios, pecans, peanuts,  Macadamia nuts or a nut medley.  Avoid "granola"; the dried cranberries and raisins are loaded with carbohydrates. Mixed nuts as long as there are no raisins,  cranberries or dried fruit.     6 PM  Dinner:     Meat/fowl/fish with  a green salad, and either broccoli, cauliflower, green beans, spinach, brussel sprouts or  Lima beans. DO NOT BREAD THE PROTEIN!!      There is a low carb pasta by Dreamfield's that is acceptable and tastes great: only 5 digestible carbs/serving.( All grocery stores but BJs carry it )  Try Kai Levins Angelo's chicken piccata or chicken or eggplant parm over low carb pasta.(Lowes and BJs)   Clifton Custard Sanchez's "Carnitas" (pulled pork, no sauce,  0 carbs) or his beef pot roast to make a dinner burrito (at BJ's)  Pesto over low carb pasta (bj's sells a good quality pesto in the center refrigerated section of the deli   Whole wheat pasta is still full of digestible carbs and  Not as low in glycemic index as Dreamfield's.   Brown rice is still rice,  So skip the rice and noodles if you eat [place] or [place] (or at least limit to 1/2 cup)  9 PM snack :   Breyer's "low carb" fudgsicle  or  ice cream bar (Carb Smart line), or  Weight Watcher's ice cream bar , or another "no sugar added" ice cream;  a serving of fresh berries/cherries with whipped cream   Cheese or DANNON'S LlGHT N FIT GREEK YOGURT  Avoid bananas, pineapple, grapes  and watermelon on a regular basis because they are high in sugar.  THINK OF THEM AS DESSERT  Remember that snack Substitutions should be less than 10 NET carbs per serving and meals < 20 carbs. Remember to subtract fiber grams to get the "net carbs."

## 2012-12-10 ENCOUNTER — Encounter: Payer: Self-pay | Admitting: Internal Medicine

## 2012-12-10 DIAGNOSIS — K7469 Other cirrhosis of liver: Secondary | ICD-10-CM | POA: Insufficient documentation

## 2012-12-10 DIAGNOSIS — K7460 Unspecified cirrhosis of liver: Secondary | ICD-10-CM | POA: Insufficient documentation

## 2012-12-10 DIAGNOSIS — N183 Chronic kidney disease, stage 3 unspecified: Secondary | ICD-10-CM | POA: Insufficient documentation

## 2012-12-10 DIAGNOSIS — E1122 Type 2 diabetes mellitus with diabetic chronic kidney disease: Secondary | ICD-10-CM | POA: Insufficient documentation

## 2012-12-10 DIAGNOSIS — K760 Fatty (change of) liver, not elsewhere classified: Secondary | ICD-10-CM | POA: Insufficient documentation

## 2012-12-10 DIAGNOSIS — N179 Acute kidney failure, unspecified: Secondary | ICD-10-CM | POA: Insufficient documentation

## 2012-12-10 DIAGNOSIS — E1121 Type 2 diabetes mellitus with diabetic nephropathy: Secondary | ICD-10-CM | POA: Insufficient documentation

## 2012-12-10 HISTORY — DX: Type 2 diabetes mellitus with diabetic chronic kidney disease: E11.22

## 2012-12-10 HISTORY — DX: Chronic kidney disease, stage 3 unspecified: N18.30

## 2012-12-10 LAB — VITAMIN D 25 HYDROXY (VIT D DEFICIENCY, FRACTURES): Vit D, 25-Hydroxy: 38 ng/mL (ref 30–89)

## 2012-12-10 NOTE — Assessment & Plan Note (Signed)
Likely secondary to dehydration given new onset. Repeat in one week

## 2012-12-10 NOTE — Assessment & Plan Note (Signed)
Found during 2011 admission for rectal bleeding secondary to colitis, ischemic versus diverticular bleed. She underwent a CT of the abdomen and pelvis which was concerning for paraesophageal mass which by EGD turn out to be esophageal varices. Varices are presumed to be due to NASH but she has not had liver biopsy. Marland Kitchen

## 2012-12-10 NOTE — Assessment & Plan Note (Signed)
Stopping lisinopril hctz due to significant change in renal function.  Will use amlodipine and resume lisinopril

## 2012-12-10 NOTE — Assessment & Plan Note (Signed)
Consider starting metformin if repeat CR has normalized after stopping hctz

## 2012-12-10 NOTE — Assessment & Plan Note (Signed)
Suspected given history of esophageal varices during endoscopy in 2011 , with no biopsy or workup done to date.  Will rule out other causes with serologies and refer to GI for biopsy,

## 2012-12-12 MED ORDER — AMLODIPINE BESYLATE 5 MG PO TABS: 5.0000 mg | ORAL_TABLET | Freq: Every day | ORAL | Status: DC

## 2012-12-12 NOTE — Addendum Note (Signed)
Addended by: Dennie Bible on: 12/12/2012 02:58 PM   Modules accepted: Orders

## 2013-01-28 ENCOUNTER — Other Ambulatory Visit: Payer: Self-pay

## 2013-04-30 ENCOUNTER — Other Ambulatory Visit: Payer: Self-pay

## 2013-06-04 ENCOUNTER — Telehealth: Payer: Self-pay | Admitting: *Deleted

## 2013-06-04 MED ORDER — AMLODIPINE BESYLATE 5 MG PO TABS: 5.0000 mg | ORAL_TABLET | Freq: Every day | ORAL | Status: DC

## 2013-06-04 MED ORDER — LEVOTHYROXINE SODIUM 88 MCG PO TABS: 88.0000 ug | ORAL_TABLET | Freq: Every day | ORAL | Status: DC

## 2013-06-04 NOTE — Telephone Encounter (Addendum)
Appt scheduled 07/01/13 with Dr. Darrick Huntsman. Appt for labs scheduled 06/08/13

## 2013-06-04 NOTE — Telephone Encounter (Signed)
Patient called for refill on medication s and I noticed she has a lot of labs pending please advise as to refill and labs amlodipine and Synthroid.

## 2013-06-04 NOTE — Telephone Encounter (Signed)
she was supposed to com e in 6 months ago for meds!! I  will refill for 30 days only after that if she has not complied there will be no refills

## 2013-06-05 ENCOUNTER — Telehealth: Payer: Self-pay | Admitting: *Deleted

## 2013-06-05 DIAGNOSIS — E785 Hyperlipidemia, unspecified: Secondary | ICD-10-CM

## 2013-06-05 DIAGNOSIS — E119 Type 2 diabetes mellitus without complications: Secondary | ICD-10-CM

## 2013-06-05 DIAGNOSIS — E039 Hypothyroidism, unspecified: Secondary | ICD-10-CM

## 2013-06-05 NOTE — Telephone Encounter (Signed)
Pt is coming in for labs 12.15.2014 what labs and dx?

## 2013-06-08 ENCOUNTER — Other Ambulatory Visit (INDEPENDENT_AMBULATORY_CARE_PROVIDER_SITE_OTHER): Payer: Medicare Other

## 2013-06-08 DIAGNOSIS — E119 Type 2 diabetes mellitus without complications: Secondary | ICD-10-CM

## 2013-06-08 DIAGNOSIS — E785 Hyperlipidemia, unspecified: Secondary | ICD-10-CM

## 2013-06-08 DIAGNOSIS — E039 Hypothyroidism, unspecified: Secondary | ICD-10-CM

## 2013-06-08 LAB — MICROALBUMIN / CREATININE URINE RATIO
Creatinine,U: 238.1 mg/dL
Microalb Creat Ratio: 2.5 mg/g (ref 0.0–30.0)
Microalb, Ur: 5.9 mg/dL — ABNORMAL HIGH (ref 0.0–1.9)

## 2013-06-08 LAB — LIPID PANEL
Cholesterol: 161 mg/dL (ref 0–200)
HDL: 35.6 mg/dL — ABNORMAL LOW
LDL Cholesterol: 100 mg/dL — ABNORMAL HIGH (ref 0–99)
Total CHOL/HDL Ratio: 5
Triglycerides: 129 mg/dL (ref 0.0–149.0)
VLDL: 25.8 mg/dL (ref 0.0–40.0)

## 2013-06-08 LAB — COMPREHENSIVE METABOLIC PANEL WITH GFR
ALT: 22 U/L (ref 0–35)
AST: 23 U/L (ref 0–37)
Albumin: 4.3 g/dL (ref 3.5–5.2)
Alkaline Phosphatase: 46 U/L (ref 39–117)
BUN: 20 mg/dL (ref 6–23)
CO2: 28 meq/L (ref 19–32)
Calcium: 9.6 mg/dL (ref 8.4–10.5)
Chloride: 105 meq/L (ref 96–112)
Creatinine, Ser: 0.9 mg/dL (ref 0.4–1.2)
GFR: 63.09 mL/min
Glucose, Bld: 120 mg/dL — ABNORMAL HIGH (ref 70–99)
Potassium: 4.2 meq/L (ref 3.5–5.1)
Sodium: 141 meq/L (ref 135–145)
Total Bilirubin: 0.5 mg/dL (ref 0.3–1.2)
Total Protein: 7.6 g/dL (ref 6.0–8.3)

## 2013-06-08 LAB — TSH: TSH: 2.8 u[IU]/mL (ref 0.35–5.50)

## 2013-06-08 LAB — HEMOGLOBIN A1C: Hgb A1c MFr Bld: 7.2 % — ABNORMAL HIGH (ref 4.6–6.5)

## 2013-06-09 LAB — VITAMIN D 25 HYDROXY (VIT D DEFICIENCY, FRACTURES): Vit D, 25-Hydroxy: 31 ng/mL (ref 30–89)

## 2013-06-10 ENCOUNTER — Encounter: Payer: Self-pay | Admitting: Internal Medicine

## 2013-07-01 ENCOUNTER — Ambulatory Visit (INDEPENDENT_AMBULATORY_CARE_PROVIDER_SITE_OTHER): Payer: Commercial Managed Care - HMO | Admitting: Internal Medicine

## 2013-07-01 ENCOUNTER — Encounter: Payer: Self-pay | Admitting: Internal Medicine

## 2013-07-01 VITALS — BP 146/78 | HR 81 | Temp 98.2°F | Resp 14 | Wt 210.2 lb

## 2013-07-01 DIAGNOSIS — I10 Essential (primary) hypertension: Secondary | ICD-10-CM

## 2013-07-01 DIAGNOSIS — E785 Hyperlipidemia, unspecified: Secondary | ICD-10-CM

## 2013-07-01 DIAGNOSIS — Z23 Encounter for immunization: Secondary | ICD-10-CM

## 2013-07-01 DIAGNOSIS — E119 Type 2 diabetes mellitus without complications: Secondary | ICD-10-CM

## 2013-07-01 DIAGNOSIS — E669 Obesity, unspecified: Secondary | ICD-10-CM

## 2013-07-01 DIAGNOSIS — E1169 Type 2 diabetes mellitus with other specified complication: Secondary | ICD-10-CM

## 2013-07-01 MED ORDER — METFORMIN HCL 500 MG PO TABS: 500.0000 mg | ORAL_TABLET | Freq: Two times a day (BID) | ORAL | Status: DC

## 2013-07-01 NOTE — Progress Notes (Signed)
Pre-visit discussion using our clinic review tool. No additional management support is needed unless otherwise documented below in the visit note.  

## 2013-07-01 NOTE — Patient Instructions (Addendum)
You received the flu vaccine today   I want you to resume lisinopril  For your blood pressure 40 mg daily .  continue amlodipine 5 mg daily as well  (Goal bp is < 130/80)  Return in one week after starting for kidney check (nonfasting blood draw)  starting metformin  500 twice daily for diabetes. This is for a1c of 7.2 (goal < 7.0)   Consider [facility]'s  Diabetes seminars or the hospital's program  You need to lose weight !! Your first goal is 170 lbs.   This is  One version of a  "Low GI"  Diet:  It will still lower your blood sugars and allow you to lose 4 to 8  lbs  per month if you follow it carefully.  Your goal with exercise is a minimum of 30 minutes of aerobic exercise 5 days per week (Walking does not count once it becomes easy!)    All of the foods can be found at grocery stores and in bulk at [employer].  The Atkins protein bars and shakes are available in more varieties at Target, WalMart and [place].       7 AM Breakfast:  Choose from the following:  Low carbohydrate Protein  Shakes (I recommend the EAS AdvantEdge "Carb Control" shakes  Or the low carb shakes by Atkins.    2.5 carbs   Arnold's "Sandwhich Thin"toasted  w/ peanut butter (no jelly: about 20 net carbs  "Bagel Thin" with cream cheese and salmon: about 20 carbs   a scrambled egg/bacon/cheese burrito made with Mission's "carb balance" whole wheat tortilla  (about 10 net carbs )   Avoid cereal and bananas, oatmeal and cream of wheat and grits. They are loaded with carbohydrates!   10 AM: high protein snack  Protein bar by Atkins (the snack size, under 200 cal, usually < 6 net carbs).    A stick of cheese:  Around 1 carb,  100 cal     Dannon Light n Fit [place] Yogurt  (80 cal, 8 carbs)  Other so called "protein bars" and Greek yogurts tend to be loaded with carbohydrates.  Remember, in food advertising, the word "energy" is synonymous for " carbohydrate."  Lunch:   A Sandwich using the bread choices  listed, Can use any  Eggs,  lunchmeat, grilled meat or canned tuna), avocado, regular mayo/mustard  and cheese.  A Salad using blue cheese, ranch,  Goddess or vinagrette,  No croutons or "confetti" and no "candied nuts" but regular nuts OK.   No pretzels or chips.  Pickles and miniature sweet peppers are a good low carb alternative that provide a "crunch"  The bread is the only source of carbohydrate in a sandwich and  can be decreased by trying some of these alternatives to traditional loaf bread  Joseph's makes a pita bread and a flat bread that are 50 cal and 4 net carbs available at [place] and [place].  This can be toasted to use with hummous as well  Toufayan makes a low carb flatbread that's 100 cal and 9 net carbs available at [employer] and BJ's makes 2 sizes of  Low carb whole wheat tortilla  (The large one is 210 cal and 6 net carbs) Avoid "Low fat dressings, as well as Barry Brunner and [place] dressings They are loaded with sugar!   3 PM/ Mid day  Snack:  Consider  1 ounce of  almonds, walnuts, pistachios, pecans, peanuts,  Macadamia nuts or a nut medley.  Avoid "granola"; the dried cranberries and raisins are loaded with carbohydrates. Mixed nuts as long as there are no raisins,  cranberries or dried fruit.     6 PM  Dinner:     Meat/fowl/fish with a green salad, and either broccoli, cauliflower, green beans, spinach, brussel sprouts or  Lima beans. DO NOT BREAD THE PROTEIN!!      There is a low carb pasta by Dreamfield's that is acceptable and tastes great: only 5 digestible carbs/serving.( All grocery stores but BJs carry it )  Try Hurley Cisco Angelo's chicken piccata or chicken or eggplant parm over low carb pasta.(Lowes and BJs)   Marjory Lies Sanchez's "Carnitas" (pulled pork, no sauce,  0 carbs) or his beef pot roast to make a dinner burrito (at BJ's)  Pesto over low carb pasta (bj's sells a good quality pesto in the center refrigerated section of the deli   Whole wheat pasta is  still full of digestible carbs and  Not as low in glycemic index as Dreamfield's.   Brown rice is still rice,  So skip the rice and noodles if you eat [place] or [place] (or at least limit to 1/2 cup)  9 PM snack :   Breyer's "low carb" fudgsicle or  ice cream bar (Carb Smart line), or  Weight Watcher's ice cream bar , or another "no sugar added" ice cream;  a serving of fresh berries/cherries with whipped cream   Cheese or DANNON'S LlGHT N FIT GREEK YOGURT  Avoid bananas, pineapple, grapes  and watermelon on a regular basis because they are high in sugar.  THINK OF THEM AS DESSERT  Remember that snack Substitutions should be less than 10 NET carbs per serving and meals < 20 carbs. Remember to subtract fiber grams to get the "net carbs."  Diabetes and Standards of Medical Care  Diabetes is complicated. You may find that your diabetes team includes a dietitian, nurse, diabetes educator, eye doctor, and more. To help everyone know what is going on and to help you get the care you deserve, the following schedule of care was developed to help keep you on track. Below are the tests, exams, vaccines, medicines, education, and plans you will need. HbA1c test This test shows how well you have controlled your glucose over the past 2 3 months. It is used to see if your diabetes management plan needs to be adjusted.   It is performed at least 2 times a year if you are meeting treatment goals.  It is performed 4 times a year if therapy has changed or if you are not meeting treatment goals. Blood pressure test  This test is performed at every routine medical visit. The goal is less than 140/90 mmHg for most people, but 130/80 mmHg in some cases. Ask your health care provider about your goal. Dental exam  Follow up with the dentist regularly. Eye exam  If you are diagnosed with type 1 diabetes as a child, get an exam upon reaching the age of 91 years or older and have had diabetes for 3 5 years. Yearly  eye exams are recommended after that initial eye exam.  If you are diagnosed with type 1 diabetes as an adult, get an exam within 5 years of diagnosis and then yearly.  If you are diagnosed with type 2 diabetes, get an exam as soon as possible after the diagnosis and then yearly. Foot care exam  Visual foot exams are performed at  every routine medical visit. The exams check for cuts, injuries, or other problems with the feet.  A comprehensive foot exam should be done yearly. This includes visual inspection as well as assessing foot pulses and testing for loss of sensation.  Check your feet nightly for cuts, injuries, or other problems with your feet. Tell your health care provider if anything is not healing. Kidney function test (urine microalbumin)  This test is performed once a year.  Type 1 diabetes: The first test is performed 5 years after diagnosis.  Type 2 diabetes: The first test is performed at the time of diagnosis.  A serum creatinine and estimated glomerular filtration rate (eGFR) test is done once a year to assess the level of chronic kidney disease (CKD), if present. Lipid profile (cholesterol, HDL, LDL, triglycerides)  Performed every 5 years for most people.  The goal for LDL is less than 100 mg/dL. If you are at high risk, the goal is less than 70 mg/dL.  The goal for HDL is 40 mg/dL 50 mg/dL for men and 50 mg/dL 60 mg/dL for women. An HDL cholesterol of 60 mg/dL or higher gives some protection against heart disease.  The goal for triglycerides is less than 150 mg/dL. Influenza vaccine, pneumococcal vaccine, and hepatitis B vaccine  The influenza vaccine is recommended yearly.  The pneumococcal vaccine is generally given once in a lifetime. However, there are some instances when another vaccination is recommended. Check with your health care provider.  The hepatitis B vaccine is also recommended for adults with diabetes. Diabetes self-management  education  Education is recommended at diagnosis and ongoing as needed. Treatment plan  Your treatment plan is reviewed at every medical visit. Document Released: 04/08/2009 Document Revised: 02/11/2013 Document Reviewed: 11/11/2012 [facility] Patient Information 2014 [place].

## 2013-07-01 NOTE — Assessment & Plan Note (Addendum)
No change,  And wt gain noted.  starting metformin .  Diabetes education rreferral given and strongly commended.  Eye exam is next month.  foot exam is normal except for callousess.  Resuming lisinopril .

## 2013-07-02 ENCOUNTER — Telehealth: Payer: Self-pay

## 2013-07-02 NOTE — Telephone Encounter (Signed)
Relevant patient education assigned to patient using Emmi. ° °

## 2013-07-03 ENCOUNTER — Encounter: Payer: Self-pay | Admitting: Internal Medicine

## 2013-07-03 NOTE — Assessment & Plan Note (Addendum)
Taking simvastatin.  LFTs normal.  No side effects Lab Results  Component Value Date   CHOL 161 06/08/2013   HDL 35.60* 06/08/2013   LDLCALC 100* 06/08/2013   TRIG 129.0 06/08/2013   CHOLHDL 5 06/08/2013   Lab Results  Component Value Date   ALT 22 06/08/2013   AST 23 06/08/2013   ALKPHOS 46 06/08/2013   BILITOT 0.5 06/08/2013

## 2013-07-03 NOTE — Assessment & Plan Note (Signed)
Elevated today.  Reviewed list of meds, patient is not taking OTC meds that could be causing,. It.  Resume lisinopril  For microscoic proteinuria.,  Return in one week for BMET  Lab Results  Component Value Date   MICROALBUR 5.9* 06/08/2013

## 2013-07-03 NOTE — Progress Notes (Signed)
Patient ID: Marissa Rodriguez, female   DOB: Nov 08, 1940, 73 y.o.   MRN: [id number]

## 2013-07-03 NOTE — Assessment & Plan Note (Signed)
I have addressed  BMI and recommended a low glycemic index diet utilizing smaller more frequent meals to increase metabolism.  I have also recommended that patient start exercising with a goal of 30 minutes of aerobic exercise a minimum of 5 days per week.  

## 2013-07-10 ENCOUNTER — Other Ambulatory Visit (INDEPENDENT_AMBULATORY_CARE_PROVIDER_SITE_OTHER): Payer: Medicare HMO

## 2013-07-10 DIAGNOSIS — E119 Type 2 diabetes mellitus without complications: Secondary | ICD-10-CM

## 2013-07-10 DIAGNOSIS — E669 Obesity, unspecified: Secondary | ICD-10-CM

## 2013-07-10 DIAGNOSIS — E1169 Type 2 diabetes mellitus with other specified complication: Secondary | ICD-10-CM

## 2013-07-10 LAB — BASIC METABOLIC PANEL WITH GFR
BUN: 20 mg/dL (ref 6–23)
CO2: 28 meq/L (ref 19–32)
Calcium: 10 mg/dL (ref 8.4–10.5)
Chloride: 104 meq/L (ref 96–112)
Creatinine, Ser: 1 mg/dL (ref 0.4–1.2)
GFR: 55.44 mL/min — ABNORMAL LOW
Glucose, Bld: 115 mg/dL — ABNORMAL HIGH (ref 70–99)
Potassium: 4.7 meq/L (ref 3.5–5.1)
Sodium: 142 meq/L (ref 135–145)

## 2013-07-12 ENCOUNTER — Encounter: Payer: Self-pay | Admitting: Internal Medicine

## 2013-07-12 ENCOUNTER — Other Ambulatory Visit: Payer: Self-pay | Admitting: Internal Medicine

## 2013-07-13 ENCOUNTER — Telehealth: Payer: Self-pay | Admitting: *Deleted

## 2013-07-13 MED ORDER — METFORMIN HCL 500 MG PO TABS: 500.0000 mg | ORAL_TABLET | Freq: Two times a day (BID) | ORAL | Status: DC

## 2013-07-13 MED ORDER — AMLODIPINE BESYLATE 5 MG PO TABS: 5.0000 mg | ORAL_TABLET | Freq: Every day | ORAL | Status: DC

## 2013-07-13 MED ORDER — SIMVASTATIN 40 MG PO TABS: 40.0000 mg | ORAL_TABLET | Freq: Every day | ORAL | Status: DC

## 2013-07-13 NOTE — Telephone Encounter (Signed)
Patient requested scripts sent to [facility] 90 day supply., request sent as requested.

## 2013-07-14 MED ORDER — AMLODIPINE BESYLATE 5 MG PO TABS: 5.0000 mg | ORAL_TABLET | Freq: Every day | ORAL | Status: DC

## 2013-07-14 MED ORDER — LEVOTHYROXINE SODIUM 88 MCG PO TABS: 88.0000 ug | ORAL_TABLET | Freq: Every day | ORAL | Status: DC

## 2013-07-14 MED ORDER — METFORMIN HCL 500 MG PO TABS: 500.0000 mg | ORAL_TABLET | Freq: Two times a day (BID) | ORAL | Status: DC

## 2013-08-21 ENCOUNTER — Telehealth: Payer: Self-pay | Admitting: *Deleted

## 2013-08-21 MED ORDER — LEVOTHYROXINE SODIUM 88 MCG PO TABS: 88.0000 ug | ORAL_TABLET | Freq: Every day | ORAL | Status: DC

## 2013-08-21 NOTE — Telephone Encounter (Signed)
Refill sent for synthroid 

## 2013-09-02 ENCOUNTER — Telehealth: Payer: Self-pay | Admitting: Emergency Medicine

## 2013-09-02 NOTE — Telephone Encounter (Signed)
Pt called wanting referral to [facility] in [place]. Dr. Herbert Deaner is a [facility] MD. I explained to her there is no need for a referral and then proceeded to reach out to their office. I have to leave a voicemail with the information as well.

## 2013-09-14 LAB — HM DIABETES EYE EXAM

## 2013-09-23 ENCOUNTER — Telehealth: Payer: Self-pay | Admitting: Internal Medicine

## 2013-10-05 ENCOUNTER — Other Ambulatory Visit: Payer: Self-pay | Admitting: Internal Medicine

## 2013-12-15 LAB — HM DIABETES EYE EXAM

## 2014-01-08 ENCOUNTER — Encounter: Payer: Self-pay | Admitting: *Deleted

## 2014-01-11 NOTE — Telephone Encounter (Signed)
Mailed unread message to pt  

## 2014-01-22 ENCOUNTER — Other Ambulatory Visit: Payer: Self-pay | Admitting: Internal Medicine

## 2014-02-08 ENCOUNTER — Other Ambulatory Visit: Payer: Self-pay | Admitting: *Deleted

## 2014-02-08 MED ORDER — METFORMIN HCL 500 MG PO TABS: 500.0000 mg | ORAL_TABLET | Freq: Two times a day (BID) | ORAL | Status: DC

## 2014-02-08 NOTE — Telephone Encounter (Signed)
Appt sch 03/17/14

## 2014-02-20 ENCOUNTER — Other Ambulatory Visit: Payer: Self-pay | Admitting: Internal Medicine

## 2014-03-17 ENCOUNTER — Encounter: Payer: Self-pay | Admitting: Internal Medicine

## 2014-03-17 ENCOUNTER — Ambulatory Visit (INDEPENDENT_AMBULATORY_CARE_PROVIDER_SITE_OTHER): Payer: Medicare HMO | Admitting: Internal Medicine

## 2014-03-17 VITALS — BP 126/76 | HR 57 | Temp 98.3°F | Resp 14 | Ht 64.0 in | Wt 208.5 lb

## 2014-03-17 DIAGNOSIS — Z0000 Encounter for general adult medical examination without abnormal findings: Secondary | ICD-10-CM

## 2014-03-17 DIAGNOSIS — R5383 Other fatigue: Secondary | ICD-10-CM

## 2014-03-17 DIAGNOSIS — R748 Abnormal levels of other serum enzymes: Secondary | ICD-10-CM

## 2014-03-17 DIAGNOSIS — E119 Type 2 diabetes mellitus without complications: Secondary | ICD-10-CM

## 2014-03-17 DIAGNOSIS — E1169 Type 2 diabetes mellitus with other specified complication: Secondary | ICD-10-CM

## 2014-03-17 DIAGNOSIS — I10 Essential (primary) hypertension: Secondary | ICD-10-CM

## 2014-03-17 DIAGNOSIS — E038 Other specified hypothyroidism: Secondary | ICD-10-CM

## 2014-03-17 DIAGNOSIS — Z90722 Acquired absence of ovaries, bilateral: Principal | ICD-10-CM

## 2014-03-17 DIAGNOSIS — Z90710 Acquired absence of both cervix and uterus: Secondary | ICD-10-CM | POA: Insufficient documentation

## 2014-03-17 DIAGNOSIS — E559 Vitamin D deficiency, unspecified: Secondary | ICD-10-CM

## 2014-03-17 DIAGNOSIS — E785 Hyperlipidemia, unspecified: Secondary | ICD-10-CM

## 2014-03-17 DIAGNOSIS — E0789 Other specified disorders of thyroid: Secondary | ICD-10-CM

## 2014-03-17 DIAGNOSIS — Z23 Encounter for immunization: Secondary | ICD-10-CM

## 2014-03-17 DIAGNOSIS — R5381 Other malaise: Secondary | ICD-10-CM

## 2014-03-17 DIAGNOSIS — E034 Atrophy of thyroid (acquired): Secondary | ICD-10-CM

## 2014-03-17 DIAGNOSIS — Z9079 Acquired absence of other genital organ(s): Secondary | ICD-10-CM

## 2014-03-17 DIAGNOSIS — Z1239 Encounter for other screening for malignant neoplasm of breast: Secondary | ICD-10-CM

## 2014-03-17 DIAGNOSIS — E669 Obesity, unspecified: Secondary | ICD-10-CM

## 2014-03-17 LAB — COMPREHENSIVE METABOLIC PANEL WITH GFR
ALT: 49 U/L — ABNORMAL HIGH (ref 0–35)
AST: 57 U/L — ABNORMAL HIGH (ref 0–37)
Albumin: 4.2 g/dL (ref 3.5–5.2)
Alkaline Phosphatase: 45 U/L (ref 39–117)
BUN: 20 mg/dL (ref 6–23)
CO2: 28 meq/L (ref 19–32)
Calcium: 10.1 mg/dL (ref 8.4–10.5)
Chloride: 102 meq/L (ref 96–112)
Creatinine, Ser: 1.1 mg/dL (ref 0.4–1.2)
GFR: 54.72 mL/min — ABNORMAL LOW
Glucose, Bld: 135 mg/dL — ABNORMAL HIGH (ref 70–99)
Potassium: 4.9 meq/L (ref 3.5–5.1)
Sodium: 140 meq/L (ref 135–145)
Total Bilirubin: 0.5 mg/dL (ref 0.2–1.2)
Total Protein: 8.1 g/dL (ref 6.0–8.3)

## 2014-03-17 LAB — MICROALBUMIN / CREATININE URINE RATIO
Creatinine,U: 424.5 mg/dL
MICROALB UR: 21.7 mg/dL — AB (ref 0.0–1.9)
MICROALB/CREAT RATIO: 5.1 mg/g (ref 0.0–30.0)

## 2014-03-17 LAB — LIPID PANEL
Cholesterol: 156 mg/dL (ref 0–200)
HDL: 35.3 mg/dL — ABNORMAL LOW
LDL Cholesterol: 95 mg/dL (ref 0–99)
NonHDL: 120.7
Total CHOL/HDL Ratio: 4
Triglycerides: 130 mg/dL (ref 0.0–149.0)
VLDL: 26 mg/dL (ref 0.0–40.0)

## 2014-03-17 LAB — VITAMIN D 25 HYDROXY (VIT D DEFICIENCY, FRACTURES): VITD: 50.29 ng/mL (ref 30.00–100.00)

## 2014-03-17 LAB — HEMOGLOBIN A1C: Hgb A1c MFr Bld: 7.1 % — ABNORMAL HIGH (ref 4.6–6.5)

## 2014-03-17 LAB — HM DIABETES FOOT EXAM: HM Diabetic Foot Exam: NORMAL

## 2014-03-17 LAB — TSH: TSH: 2.86 u[IU]/mL (ref 0.35–4.50)

## 2014-03-17 NOTE — Patient Instructions (Signed)

## 2014-03-17 NOTE — Progress Notes (Signed)
Pre-visit discussion using our clinic review tool. No additional management support is needed unless otherwise documented below in the visit note.  

## 2014-03-17 NOTE — Progress Notes (Signed)
Patient ID: Marissa Rodriguez, female   DOB: 1941/03/05, 73 y.o.   MRN: [id number]   The patient is here for annual Medicare wellness examination and management of other chronic and acute problems. including diabetes mellitus, hypertension and hyperlipidemia.  She was last seen i January. She has been lost to folllow up due ot multiple  amily issues.  She has been troubled by mother's frequent hospitalizations for hyponatremia.  Her Husband Edd Arbour was recently diagnosed with pulmonary fibrosis, despite being a nonsmoker,   Getting stem cell therapy   Not losing sleep but has occasional trouble initiating sleep, not taking any medicaitons    The risk factors are reflected in the social history.  The roster of all physicians providing medical care to patient - is listed in the Snapshot section of the chart.  Activities of daily living:  The patient is 100% independent in all ADLs: dressing, toileting, feeding as well as independent mobility  Home safety : The patient has smoke detectors in the home. They wear seatbelts.  There are no firearms at home. There is no violence in the home.   There is no risks for hepatitis, STDs or HIV. There is no   history of blood transfusion. They have no travel history to infectious disease endemic areas of the world.  The patient has seen their dentist in the last six month. They have seen their eye doctor in the last year. They admit to slight hearing difficulty with regard to whispered voices and some television programs.  They have deferred audiologic testing in the last year.  They do not  have excessive sun exposure. Discussed the need for sun protection: hats, long sleeves and use of sunscreen if there is significant sun exposure.   Diet: the importance of a healthy diet is discussed. They do have a healthy diet.  The benefits of regular aerobic exercise were discussed. She is not exercising. .   Depression screen: there are no signs or vegative symptoms of  depression- irritability, change in appetite, anhedonia, sadness/tearfullness.  Cognitive assessment: the patient manages all their financial and personal affairs and is actively engaged. They could relate day,date,year and events; recalled 2/3 objects at 3 minutes; performed clock-face test normally.  The following portions of the patient's history were reviewed and updated as appropriate: allergies, current medications, past family history, past medical history,  past surgical history, past social history  and problem list.  Visual acuity was not assessed per patient preference since she has regular follow up with her ophthalmologist. Hearing and body mass index were assessed and reviewed.   During the course of the visit the patient was educated and counseled about appropriate screening and preventive services including : fall prevention , diabetes screening, nutrition counseling, colorectal cancer screening, and recommended immunizations.    Objective:  BP 126/76  Pulse 57  Temp(Src) 98.3 F (36.8 C) (Oral)  Resp 14  Ht 5\' 4"  (1.626 m)  Wt 208 lb 8 oz (94.575 kg)  BMI 35.77 kg/m2  SpO2 96% General appearance: alert, cooperative and appears stated age Head: Normocephalic, without obvious abnormality, atraumatic Eyes: conjunctivae/corneas clear. PERRL, EOM's intact. Fundi benign. Ears: normal TM's and external ear canals both ears Nose: Nares normal. Septum midline. Mucosa normal. No drainage or sinus tenderness. Throat: lips, mucosa, and tongue normal; teeth and gums normal Neck: no adenopathy, no carotid bruit, no JVD, supple, symmetrical, trachea midline and thyroid not enlarged, symmetric, no tenderness/mass/nodules Lungs: clear to auscultation bilaterally Breasts: normal appearance, no  masses or tenderness Heart: regular rate and rhythm, S1, S2 normal, no murmur, click, rub or gallop Abdomen: soft, non-tender; bowel sounds normal; no masses,  no organomegaly Extremities:  extremities normal, atraumatic, no cyanosis or edema Pulses: 2+ and symmetric Skin: Skin color, texture, turgor normal. No rashes or lesions Neurologic: Alert and oriented X 3, normal strength and tone. Normal symmetric reflexes. Normal coordination and gait.   Assessment and Plan:  Hypertension Well controlled on current regimen and renal function stable, but change to lisinopril for proteinuria.  Lab Results  Component Value Date   CREATININE 1.1 03/17/2014   Lab Results  Component Value Date   NA 140 03/17/2014   K 4.9 03/17/2014   CL 102 03/17/2014   CO2 28 03/17/2014     Diabetes mellitus type 2 in obese Diagnosed in January with gasting glucose of 139 and aqc of 7.3  .  [place] diabetes seminar referral was made.  Her DM is  well-controlled on metformin alone.  Patient is up-to-date on eye exams and foot exam is normal today.   Patient is tolerating statin therapy for CAD risk reduction and her anti hypertensive was changed to  ACE/ARB but patient still not taking it.  Patient has miscroscopic proteinuria. Lab Results  Component Value Date   HGBA1C 7.1* 03/17/2014   Lab Results  Component Value Date   MICROALBUR 21.7* 03/17/2014     Hyperlipidemia LDL and triglycerides are at goal on current medications. She has no side effects and liver enzymes are stable, elevated due to NASH. Marland Kitchen No changes today   Lab Results  Component Value Date   CHOL 156 03/17/2014   HDL 35.30* 03/17/2014   LDLCALC 95 03/17/2014   TRIG 130.0 03/17/2014   CHOLHDL 4 03/17/2014   Lab Results  Component Value Date   ALT 49* 03/17/2014   AST 57* 03/17/2014   ALKPHOS 45 03/17/2014   BILITOT 0.5 03/17/2014      Obesity (BMI 30-39.9) I have addressed  BMI and recommended a low glycemic index diet utilizing smaller more frequent meals to increase metabolism.  I have also recommended that patient start exercising with a goal of 30 minutes of aerobic exercise a minimum of 5 days per week.     Routine general medical examination at a health care facility Annual Medicare wellnesse exam was done including breast, excluding pelvic and PAP smear. All screenings have been addressed and a printed health maintenance schedule was given to patient.    Elevated liver enzymes She  is asymptomatic.  Serologies for viral hepatitis , iron overload and autoimmune hepatitis are pending and ultrasound will be done as well  Hypothyroidism Thyroid function is WNL on current dose.  No current changes needed.   Lab Results  Component Value Date   TSH 2.86 03/17/2014    A total of 45 minutes was spent with patient more than half of which was spent in counseling patient on the above mentioned issues , reviewing and explaining recent labs , and coordination of care.   Updated Medication List Outpatient Encounter Prescriptions as of 03/17/2014  Medication Sig  . levothyroxine (SYNTHROID, LEVOTHROID) 88 MCG tablet TAKE 1 TABLET EVERY DAY  . metFORMIN (GLUCOPHAGE) 500 MG tablet Take 1 tablet (500 mg total) by mouth 2 (two) times daily with a meal.  . simvastatin (ZOCOR) 40 MG tablet Take 1 tablet (40 mg total) by mouth daily.  . [DISCONTINUED] amLODipine (NORVASC) 5 MG tablet TAKE 1 TABLET EVERY DAY  (  NEED MD APPOINTMENT  )  . lisinopril (PRINIVIL,ZESTRIL) 40 MG tablet Take 1 tablet (40 mg total) by mouth daily.  . TDaP (BOOSTRIX) 5-2.5-18.5 LF-MCG/0.5 injection Inject 0.5 mLs into the muscle once.

## 2014-03-20 ENCOUNTER — Encounter: Payer: Self-pay | Admitting: Internal Medicine

## 2014-03-20 MED ORDER — LISINOPRIL 40 MG PO TABS: 40.0000 mg | ORAL_TABLET | Freq: Every day | ORAL | Status: DC

## 2014-03-20 NOTE — Assessment & Plan Note (Addendum)
Diagnosed in January with gasting glucose of 139 and aqc of 7.3  .  [place] diabetes seminar referral was made.  Her DM is  well-controlled on metformin alone.  Patient is up-to-date on eye exams and foot exam is normal today.   Patient is tolerating statin therapy for CAD risk reduction and her anti hypertensive was changed to  ACE/ARB but patient still not taking it.  Patient has miscroscopic proteinuria. Lab Results  Component Value Date   HGBA1C 7.1* 03/17/2014   Lab Results  Component Value Date   MICROALBUR 21.7* 03/17/2014

## 2014-03-20 NOTE — Assessment & Plan Note (Signed)
LDL and triglycerides are at goal on current medications. She has no side effects and liver enzymes are stable, elevated due to NASH. Marland Kitchen No changes today   Lab Results  Component Value Date   CHOL 156 03/17/2014   HDL 35.30* 03/17/2014   LDLCALC 95 03/17/2014   TRIG 130.0 03/17/2014   CHOLHDL 4 03/17/2014   Lab Results  Component Value Date   ALT 49* 03/17/2014   AST 57* 03/17/2014   ALKPHOS 45 03/17/2014   BILITOT 0.5 03/17/2014

## 2014-03-20 NOTE — Assessment & Plan Note (Addendum)
Well controlled on current regimen and renal function stable, but change to lisinopril for proteinuria.  Lab Results  Component Value Date   CREATININE 1.1 03/17/2014   Lab Results  Component Value Date   NA 140 03/17/2014   K 4.9 03/17/2014   CL 102 03/17/2014   CO2 28 03/17/2014

## 2014-03-20 NOTE — Assessment & Plan Note (Signed)
She  is asymptomatic.  Serologies for viral hepatitis , iron overload and autoimmune hepatitis are pending and ultrasound will be done as well

## 2014-03-20 NOTE — Assessment & Plan Note (Signed)
I have addressed  BMI and recommended a low glycemic index diet utilizing smaller more frequent meals to increase metabolism.  I have also recommended that patient start exercising with a goal of 30 minutes of aerobic exercise a minimum of 5 days per week.  

## 2014-03-20 NOTE — Assessment & Plan Note (Signed)
Annual Medicare wellnesse exam was done including breast, excluding pelvic and PAP smear. All screenings have been addressed and a printed health maintenance schedule was given to patient.

## 2014-03-20 NOTE — Assessment & Plan Note (Signed)
Thyroid function is WNL on current dose.  No current changes needed.   Lab Results  Component Value Date   TSH 2.86 03/17/2014

## 2014-04-20 ENCOUNTER — Telehealth: Payer: Self-pay | Admitting: Internal Medicine

## 2014-04-20 NOTE — Telephone Encounter (Signed)
Received message from pt requesting lab work. There are no labs ordered. Please advise.msn

## 2014-04-20 NOTE — Telephone Encounter (Signed)
Patient had labs 9/15 please advise.

## 2014-04-20 NOTE — Telephone Encounter (Signed)
Tell her to read y e mail from [place]

## 2014-04-21 NOTE — Telephone Encounter (Signed)
Left message for patient to check Email

## 2014-04-23 ENCOUNTER — Other Ambulatory Visit (INDEPENDENT_AMBULATORY_CARE_PROVIDER_SITE_OTHER): Payer: Medicare HMO

## 2014-04-23 DIAGNOSIS — R748 Abnormal levels of other serum enzymes: Secondary | ICD-10-CM

## 2014-04-23 LAB — FERRITIN: Ferritin: 94 ng/mL (ref 10.0–291.0)

## 2014-04-24 LAB — HEPATITIS C ANTIBODY: HCV Ab: NEGATIVE

## 2014-04-24 LAB — HEPATITIS B SURFACE ANTIGEN: Hepatitis B Surface Ag: NEGATIVE

## 2014-04-24 LAB — IRON AND TIBC
%SAT: 24 % (ref 20–55)
Iron: 93 ug/dL (ref 42–145)
TIBC: 383 ug/dL (ref 250–470)
UIBC: 290 ug/dL (ref 125–400)

## 2014-04-24 LAB — HEPATITIS B CORE ANTIBODY, TOTAL: Hep B Core Total Ab: NONREACTIVE

## 2014-04-26 ENCOUNTER — Other Ambulatory Visit: Payer: Self-pay | Admitting: Internal Medicine

## 2014-04-26 LAB — ANTI-NUCLEAR AB-TITER (ANA TITER): ANA Titer 1: NEGATIVE " "

## 2014-04-26 LAB — ANA: Anti Nuclear Antibody(ANA): POSITIVE — AB

## 2014-04-26 LAB — ANTI-SMITH ANTIBODY: ENA SM Ab Ser-aCnc: <1

## 2014-04-26 MED ORDER — METFORMIN HCL 500 MG PO TABS: 500.0000 mg | ORAL_TABLET | Freq: Two times a day (BID) | ORAL | Status: DC

## 2014-04-26 MED ORDER — LEVOTHYROXINE SODIUM 88 MCG PO TABS: ORAL_TABLET | ORAL | Status: DC

## 2014-04-27 ENCOUNTER — Encounter (INDEPENDENT_AMBULATORY_CARE_PROVIDER_SITE_OTHER): Payer: Commercial Managed Care - HMO | Admitting: Ophthalmology

## 2014-04-27 DIAGNOSIS — E11329 Type 2 diabetes mellitus with mild nonproliferative diabetic retinopathy without macular edema: Secondary | ICD-10-CM

## 2014-04-27 DIAGNOSIS — H4311 Vitreous hemorrhage, right eye: Secondary | ICD-10-CM

## 2014-04-27 DIAGNOSIS — E11351 Type 2 diabetes mellitus with proliferative diabetic retinopathy with macular edema: Secondary | ICD-10-CM

## 2014-04-27 DIAGNOSIS — I10 Essential (primary) hypertension: Secondary | ICD-10-CM

## 2014-04-27 DIAGNOSIS — E11311 Type 2 diabetes mellitus with unspecified diabetic retinopathy with macular edema: Secondary | ICD-10-CM

## 2014-04-27 DIAGNOSIS — H35033 Hypertensive retinopathy, bilateral: Secondary | ICD-10-CM

## 2014-04-27 LAB — ALKALINE PHOSPHATASE ISOENZYMES
Alkaline Phonsphatase: 52 U/L (ref 33–130)
Bone Isoenzymes: 51 % (ref 28–66)
Intestinal Isoenzymes: 0 % — ABNORMAL LOW (ref 1–24)
Liver Isoenzymes: 49 % (ref 25–69)
Macrohepatic isoenzymes: 0 %

## 2014-04-28 ENCOUNTER — Encounter: Payer: Self-pay | Admitting: Internal Medicine

## 2014-04-28 ENCOUNTER — Other Ambulatory Visit: Payer: Self-pay | Admitting: Internal Medicine

## 2014-04-28 DIAGNOSIS — R748 Abnormal levels of other serum enzymes: Secondary | ICD-10-CM

## 2014-04-30 ENCOUNTER — Ambulatory Visit: Payer: Self-pay | Admitting: Internal Medicine

## 2014-05-02 ENCOUNTER — Telehealth: Payer: Self-pay | Admitting: Internal Medicine

## 2014-05-02 DIAGNOSIS — K760 Fatty (change of) liver, not elsewhere classified: Secondary | ICD-10-CM

## 2014-05-12 LAB — HM MAMMOGRAPHY: HM Mammogram: NEGATIVE

## 2014-05-25 ENCOUNTER — Encounter: Payer: Self-pay | Admitting: Internal Medicine

## 2014-06-07 ENCOUNTER — Encounter: Payer: Self-pay | Admitting: Internal Medicine

## 2014-06-15 MED ORDER — PHENTERMINE HCL 37.5 MG PO TABS: ORAL_TABLET | ORAL | Status: DC

## 2014-06-15 NOTE — Telephone Encounter (Signed)
Faxed to pharmacy

## 2014-06-15 NOTE — Telephone Encounter (Signed)
pHentermine.  prescribed and printed  fax printed rx to pharmacy once signed

## 2014-06-23 ENCOUNTER — Encounter: Payer: Self-pay | Admitting: *Deleted

## 2014-08-26 ENCOUNTER — Ambulatory Visit (INDEPENDENT_AMBULATORY_CARE_PROVIDER_SITE_OTHER): Payer: Commercial Managed Care - HMO | Admitting: Ophthalmology

## 2014-09-09 ENCOUNTER — Ambulatory Visit (INDEPENDENT_AMBULATORY_CARE_PROVIDER_SITE_OTHER): Payer: Commercial Managed Care - HMO | Admitting: Ophthalmology

## 2014-09-09 DIAGNOSIS — E11319 Type 2 diabetes mellitus with unspecified diabetic retinopathy without macular edema: Secondary | ICD-10-CM

## 2014-09-09 DIAGNOSIS — H35033 Hypertensive retinopathy, bilateral: Secondary | ICD-10-CM

## 2014-09-09 DIAGNOSIS — I10 Essential (primary) hypertension: Secondary | ICD-10-CM

## 2014-09-09 DIAGNOSIS — E11359 Type 2 diabetes mellitus with proliferative diabetic retinopathy without macular edema: Secondary | ICD-10-CM

## 2014-09-09 DIAGNOSIS — H43813 Vitreous degeneration, bilateral: Secondary | ICD-10-CM | POA: Diagnosis not present

## 2014-09-09 DIAGNOSIS — E11329 Type 2 diabetes mellitus with mild nonproliferative diabetic retinopathy without macular edema: Secondary | ICD-10-CM | POA: Diagnosis not present

## 2014-09-09 LAB — HM DIABETES EYE EXAM

## 2014-11-19 ENCOUNTER — Other Ambulatory Visit: Payer: Self-pay | Admitting: Internal Medicine

## 2014-12-03 NOTE — Telephone Encounter (Signed)
Mailed unread message to pt  

## 2014-12-16 ENCOUNTER — Encounter: Payer: Self-pay | Admitting: *Deleted

## 2014-12-16 LAB — HM DIABETES EYE EXAM

## 2015-01-07 ENCOUNTER — Telehealth: Payer: Self-pay | Admitting: Internal Medicine

## 2015-01-07 NOTE — Telephone Encounter (Signed)
Corrected abstract per MD.

## 2015-01-24 ENCOUNTER — Other Ambulatory Visit: Payer: Self-pay

## 2015-01-24 ENCOUNTER — Other Ambulatory Visit: Payer: Self-pay | Admitting: Internal Medicine

## 2015-01-24 MED ORDER — METFORMIN HCL 500 MG PO TABS: 500.0000 mg | ORAL_TABLET | Freq: Two times a day (BID) | ORAL | Status: DC

## 2015-01-24 NOTE — Telephone Encounter (Signed)
Pt has a scheduled appt on 9/29 so i refilled medications with no refills.

## 2015-03-24 ENCOUNTER — Encounter: Payer: Self-pay | Admitting: Internal Medicine

## 2015-03-24 ENCOUNTER — Ambulatory Visit (INDEPENDENT_AMBULATORY_CARE_PROVIDER_SITE_OTHER): Payer: Commercial Managed Care - HMO | Admitting: Internal Medicine

## 2015-03-24 VITALS — BP 122/68 | HR 78 | Temp 98.3°F | Resp 12 | Ht 63.75 in | Wt 210.4 lb

## 2015-03-24 DIAGNOSIS — K760 Fatty (change of) liver, not elsewhere classified: Secondary | ICD-10-CM

## 2015-03-24 DIAGNOSIS — E559 Vitamin D deficiency, unspecified: Secondary | ICD-10-CM | POA: Diagnosis not present

## 2015-03-24 DIAGNOSIS — I10 Essential (primary) hypertension: Secondary | ICD-10-CM

## 2015-03-24 DIAGNOSIS — Z1239 Encounter for other screening for malignant neoplasm of breast: Secondary | ICD-10-CM

## 2015-03-24 DIAGNOSIS — Z23 Encounter for immunization: Secondary | ICD-10-CM

## 2015-03-24 DIAGNOSIS — Z90710 Acquired absence of both cervix and uterus: Secondary | ICD-10-CM | POA: Diagnosis not present

## 2015-03-24 DIAGNOSIS — Z90722 Acquired absence of ovaries, bilateral: Secondary | ICD-10-CM

## 2015-03-24 DIAGNOSIS — E038 Other specified hypothyroidism: Secondary | ICD-10-CM

## 2015-03-24 DIAGNOSIS — Z113 Encounter for screening for infections with a predominantly sexual mode of transmission: Secondary | ICD-10-CM | POA: Diagnosis not present

## 2015-03-24 DIAGNOSIS — E669 Obesity, unspecified: Secondary | ICD-10-CM

## 2015-03-24 DIAGNOSIS — Z0000 Encounter for general adult medical examination without abnormal findings: Secondary | ICD-10-CM | POA: Diagnosis not present

## 2015-03-24 DIAGNOSIS — D126 Benign neoplasm of colon, unspecified: Secondary | ICD-10-CM | POA: Diagnosis not present

## 2015-03-24 DIAGNOSIS — R944 Abnormal results of kidney function studies: Secondary | ICD-10-CM

## 2015-03-24 DIAGNOSIS — E119 Type 2 diabetes mellitus without complications: Secondary | ICD-10-CM | POA: Diagnosis not present

## 2015-03-24 DIAGNOSIS — E034 Atrophy of thyroid (acquired): Secondary | ICD-10-CM

## 2015-03-24 DIAGNOSIS — E785 Hyperlipidemia, unspecified: Secondary | ICD-10-CM

## 2015-03-24 DIAGNOSIS — E1169 Type 2 diabetes mellitus with other specified complication: Secondary | ICD-10-CM

## 2015-03-24 DIAGNOSIS — Z9079 Acquired absence of other genital organ(s): Secondary | ICD-10-CM

## 2015-03-24 LAB — COMPREHENSIVE METABOLIC PANEL WITH GFR
ALT: 57 U/L — ABNORMAL HIGH (ref 0–35)
AST: 39 U/L — ABNORMAL HIGH (ref 0–37)
Albumin: 4.4 g/dL (ref 3.5–5.2)
Alkaline Phosphatase: 45 U/L (ref 39–117)
BUN: 31 mg/dL — ABNORMAL HIGH (ref 6–23)
CO2: 26 meq/L (ref 19–32)
Calcium: 10.1 mg/dL (ref 8.4–10.5)
Chloride: 103 meq/L (ref 96–112)
Creatinine, Ser: 1.29 mg/dL — ABNORMAL HIGH (ref 0.40–1.20)
GFR: 43.03 mL/min — ABNORMAL LOW
Glucose, Bld: 156 mg/dL — ABNORMAL HIGH (ref 70–99)
Potassium: 4.1 meq/L (ref 3.5–5.1)
Sodium: 142 meq/L (ref 135–145)
Total Bilirubin: 0.5 mg/dL (ref 0.2–1.2)
Total Protein: 8.2 g/dL (ref 6.0–8.3)

## 2015-03-24 LAB — LIPID PANEL
Cholesterol: 251 mg/dL — ABNORMAL HIGH (ref 0–200)
HDL: 36.7 mg/dL — ABNORMAL LOW
LDL Cholesterol: 184 mg/dL — ABNORMAL HIGH (ref 0–99)
NonHDL: 213.99
Total CHOL/HDL Ratio: 7
Triglycerides: 152 mg/dL — ABNORMAL HIGH (ref 0.0–149.0)
VLDL: 30.4 mg/dL (ref 0.0–40.0)

## 2015-03-24 LAB — VITAMIN D 25 HYDROXY (VIT D DEFICIENCY, FRACTURES): VITD: 30.95 ng/mL (ref 30.00–100.00)

## 2015-03-24 LAB — HEMOGLOBIN A1C: Hgb A1c MFr Bld: 7.1 % — ABNORMAL HIGH (ref 4.6–6.5)

## 2015-03-24 LAB — MICROALBUMIN / CREATININE URINE RATIO
Creatinine,U: 289.5 mg/dL
Microalb Creat Ratio: 1 mg/g (ref 0.0–30.0)
Microalb, Ur: 3 mg/dL — ABNORMAL HIGH (ref 0.0–1.9)

## 2015-03-24 LAB — LDL CHOLESTEROL, DIRECT: Direct LDL: 223 mg/dL

## 2015-03-24 NOTE — Assessment & Plan Note (Signed)
Due for colonoscopy 

## 2015-03-24 NOTE — Assessment & Plan Note (Signed)
At age 74,  No cervix

## 2015-03-24 NOTE — Progress Notes (Signed)
Patient ID: Marissa Rodriguez, female    DOB: 1941/03/03  Age: 74 y.o. MRN: [id number]  The patient is here for annual Medicare wellness examination and management of other chronic and acute problems.   The risk factors are reflected in the social history.  The roster of all physicians providing medical care to patient - is listed in the Snapshot section of the chart.  Activities of daily living:  The patient is 100% independent in all ADLs: dressing, toileting, feeding as well as independent mobility  Home safety : The patient has smoke detectors in the home. They wear seatbelts.  There are no firearms at home. There is no violence in the home.   There is no risks for hepatitis, STDs or HIV. There is no   history of blood transfusion. They have no travel history to infectious disease endemic areas of the world.  The patient has seen their dentist in the last six month. They have seen their eye doctor in the last year. They admit to slight hearing difficulty with regard to whispered voices and some television programs.  They have deferred audiologic testing in the last year.  They do not  have excessive sun exposure. Discussed the need for sun protection: hats, long sleeves and use of sunscreen if there is significant sun exposure.   Diet: the importance of a healthy diet is discussed. They do not have a healthy diet relative to her BMI. .  The benefits of regular aerobic exercise were discussed. She does not walk or do any regular exercise    Depression screen: there are no signs or vegative symptoms of depression- irritability, change in appetite, anhedonia, sadness/tearfullness.  Cognitive assessment: the patient manages all their financial and personal affairs and is actively engaged. They could relate day,date,year and events; recalled 2/3 objects at 3 minutes; performed clock-face test normally.  The following portions of the patient's history were reviewed and updated as appropriate:  allergies, current medications, past family history, past medical history,  past surgical history, past social history  and problem list.  Visual acuity was not assessed per patient preference since she has regular follow up with her ophthalmologist. Hearing and body mass index were assessed and reviewed.   During the course of the visit the patient was educated and counseled about appropriate screening and preventive services including : fall prevention , diabetes screening, nutrition counseling, colorectal cancer screening, and recommended immunizations.    CC: The primary encounter diagnosis was Encounter for immunization. Diagnoses of S/P TAH-BSO (total abdominal hysterectomy and bilateral salpingo-oophorectomy), Tubular adenoma of colon, Diabetes mellitus type 2 in obese, Hepatic steatosis, Hyperlipidemia, Essential hypertension, Hypothyroidism due to acquired atrophy of thyroid, Screening for STD (sexually transmitted disease), Breast cancer screening, Vitamin D deficiency, Obesity (BMI 30-39.9), Medicare annual wellness visit, subsequent, and Decreased GFR were also pertinent to this visit.  One year since last seen  Despite history of diabetes, hyperlipidemia.  DM: blood sugars 120 to 140.  No low blood sugars .  toleratig metfomrin.   Still takign simvastatin last labs one year ago  Increased anxiety secondary to husband's ailing health and recent diagnosis of pulmonary fibrosis and hypertension complicated by cardiomyopathy  Mother is 46 , lives at [facility] last night and sent to ER with head trauma.  History Marissa Rodriguez has a past medical history of Hypertension; Hyperlipidemia; History of MRI of brain and brain stem (May 2012); Tubular adenoma of colon (Nov 2010); and Hypothyroidism.   She has past surgical  history that includes Abdominal hysterectomy (1975) and Appendectomy (1975).   Her family history includes Cancer in her father.She reports that she has never smoked. She has  never used smokeless tobacco. She reports that she does not drink alcohol or use illicit drugs.  Outpatient Prescriptions Prior to Visit  Medication Sig Dispense Refill  . levothyroxine (SYNTHROID, LEVOTHROID) 88 MCG tablet TAKE 1 TABLET EVERY DAY 90 tablet 3  . lisinopril (PRINIVIL,ZESTRIL) 40 MG tablet Take 1 tablet (40 mg total) by mouth daily. 90 tablet 3  . metFORMIN (GLUCOPHAGE) 500 MG tablet Take 1 tablet (500 mg total) by mouth 2 (two) times daily with a meal. 180 tablet 0  . simvastatin (ZOCOR) 40 MG tablet Take 1 tablet (40 mg total) by mouth daily. 90 tablet 1  . TDaP (BOOSTRIX) 5-2.5-18.5 LF-MCG/0.5 injection Inject 0.5 mLs into the muscle once. 0.5 mL 0  . phentermine (ADIPEX-P) 37.5 MG tablet 1/2 tablet in the am and early afternoon 30 tablet 2   No facility-administered medications prior to visit.    Review of Systems   Patient denies headache, fevers, malaise, unintentional weight loss, skin rash, eye pain, sinus congestion and sinus pain, sore throat, dysphagia,  hemoptysis , cough, dyspnea, wheezing, chest pain, palpitations, orthopnea, edema, abdominal pain, nausea, melena, diarrhea, constipation, flank pain, dysuria, hematuria, urinary  Frequency, nocturia, numbness, tingling, seizures,  Focal weakness, Loss of consciousness,  Tremor, insomnia, depression, anxiety, and suicidal ideation.      Objective:  BP 122/68 mmHg  Pulse 78  Temp(Src) 98.3 F (36.8 C) (Oral)  Resp 12  Ht 5' 3.75" (1.619 m)  Wt 210 lb 6 oz (95.425 kg)  BMI 36.41 kg/m2  SpO2 95%  Physical Exam   General appearance: alert, cooperative and appears stated age Head: Normocephalic, without obvious abnormality, atraumatic Eyes: conjunctivae/corneas clear. PERRL, EOM's intact. Fundi benign. Ears: normal TM's and external ear canals both ears Nose: Nares normal. Septum midline. Mucosa normal. No drainage or sinus tenderness. Throat: lips, mucosa, and tongue normal; teeth and gums normal Neck: no  adenopathy, no carotid bruit, no JVD, supple, symmetrical, trachea midline and thyroid not enlarged, symmetric, no tenderness/mass/nodules Lungs: clear to auscultation bilaterally Breasts: normal appearance, no masses or tenderness Heart: regular rate and rhythm, S1, S2 normal, no murmur, click, rub or gallop Abdomen: soft, non-tender; bowel sounds normal; no masses,  no organomegaly Extremities: extremities normal, atraumatic, no cyanosis or edema Pulses: 2+ and symmetric Skin: Skin color, texture, turgor normal. No rashes or lesions Neurologic: Alert and oriented X 3, normal strength and tone. Normal symmetric reflexes. Normal coordination and gait.     Assessment & Plan:   Problem List Items Addressed This Visit    Hypertension    Well controlled on current regimen. Renal function has decined slightly,  Will repeat in one month  Lab Results  Component Value Date   CREATININE 1.29* 03/24/2015   Lab Results  Component Value Date   NA 142 03/24/2015   K 4.1 03/24/2015   CL 103 03/24/2015   CO2 26 03/24/2015         Hypothyroidism    Thyroid function is WNL on current dose.  No current changes needed.   Lab Results  Component Value Date   TSH 2.86 03/17/2014           Relevant Orders   TSH   Tubular adenoma of colon    Due for colonoscopy      Relevant Orders   Ambulatory referral to Gastroenterology  Obesity (BMI 30-39.9)    I have addressed  BMI and recommended a low glycemic index diet utilizing smaller more frequent meals to increase metabolism.  I have also recommended that patient start exercising with a goal of 30 minutes of aerobic exercise a minimum of 5 days per week.          Medicare annual wellness visit, subsequent    Annual Medicare wellness  exam was done as well as a comprehensive physical exam and management of acute and chronic conditions .  During the course of the visit the patient was educated and counseled about appropriate screening  and preventive services including : fall prevention , diabetes screening, nutrition counseling, colorectal cancer screening, and recommended immunizations.  Printed recommendations for health maintenance screenings was given.       Hepatic steatosis    Presumed by ultrasound changes and serologies negative for autoimmune causes of hepatitis. Esophageal varices seen on prior CT .   Current liver enzymes are elevated and all modifiable risk factors including obesity, diabetes and hyperlipidemia have been addressed .  She will be advised to have hepatitis A/B vaccines done and referral to Liver Clinic.    Lab Results  Component Value Date   ALT 57* 03/24/2015   AST 39* 03/24/2015   ALKPHOS 45 03/24/2015   BILITOT 0.5 03/24/2015          Relevant Orders   Ambulatory referral to Gastroenterology   Diabetes mellitus type 2 in obese    Diagnosed in January 2015 withfasting glucose of 139 and aqc of 7.3  .  [place] diabetes seminar referral was made.  Her DM is  well-controlled on metformin alone.  Patient is up-to-date on eye exams and foot exam is normal today .   Patient is tolerating statin therapy for CAD risk reduction and her anti hypertensive was changed to  ACE/ARB for management of  miscroscopic proteinuria. Lab Results  Component Value Date   HGBA1C 7.1* 03/24/2015   Lab Results  Component Value Date   MICROALBUR 3.0* 03/24/2015           Relevant Orders   Comprehensive metabolic panel (Completed)   Hemoglobin A1c (Completed)   Microalbumin / creatinine urine ratio (Completed)   S/P TAH-BSO (total abdominal hysterectomy and bilateral salpingo-oophorectomy)    At age 48,  No cervix       Hyperlipidemia   Relevant Orders   LDL cholesterol, direct (Completed)   Lipid panel (Completed)    Other Visit Diagnoses    Encounter for immunization    -  Primary    Screening for STD (sexually transmitted disease)        Relevant Orders    HIV antibody    Hepatitis  C antibody    Breast cancer screening        Relevant Orders    MM DIGITAL SCREENING BILATERAL    Vitamin D deficiency        Relevant Orders    Vit D  25 hydroxy (rtn osteoporosis monitoring) (Completed)    Decreased GFR        Relevant Orders    Basic metabolic panel       I have discontinued Ms. Salsberry phentermine. I am also having her maintain her Tdap, simvastatin, lisinopril, levothyroxine, and metFORMIN.  No orders of the defined types were placed in this encounter.    Medications Discontinued During This Encounter  Medication Reason  . phentermine (ADIPEX-P) 37.5 MG tablet  Follow-up: No Follow-up on file.   Crecencio Mc, MD

## 2015-03-24 NOTE — Patient Instructions (Addendum)
You should plan to see me every 6 months since you have conditions that require more periodic monitoring than annually (Diabetes, hypertension, hyperlipidemia and hypothyroidism!)   I want you to lose 18 lbs by your next visit using a  low glycemic index diet and regular exercise   his is  my version of a  "Low GI"  Diet:  It will still lower your blood sugars and allow you to lose 4 to 8  lbs  per month if you follow it carefully.  Your goal with exercise is a minimum of 30 minutes of aerobic exercise 5 days per week (Walking does not count once it becomes easy!)     All of the foods can be found at grocery stores and in bulk at [employer].  The Atkins protein bars and shakes are available in more varieties at [employer], [employer] and [place].     7 AM Breakfast:  Choose from the following:  Low carbohydrate Protein  Shakes (I recommend the EAS AdvantEdge "Carb Control" shakes  Or the low carb shakes by Atkins.    2.5 carbs   Arnold's "Sandwhich Thin"toasted  w/ peanut butter (no jelly: about 20 net carbs  "Bagel Thin" with cream cheese and salmon: about 20 carbs   a scrambled egg/bacon/cheese burrito made with Mission's "carb balance" whole wheat tortilla  (about 10 net carbs )  A slice of home made fritatta (egg based dish without a crust:  google it)    Avoid cereal and bananas, oatmeal and cream of wheat and grits. They are loaded with carbohydrates!   10 AM: high protein snack  Protein bar by Atkins (the snack size, under 200 cal, usually < 6 net carbs).    A stick of cheese:  Around 1 carb,  100 cal     Dannon Light n Fit [place] Yogurt  (80 cal, 8 carbs)  Other so called "protein bars" and Greek yogurts tend to be loaded with carbohydrates.  Remember, in food advertising, the word "energy" is synonymous for " carbohydrate."  Lunch:   A Sandwich using the bread choices listed, Can use any  Eggs,  lunchmeat, grilled meat or canned tuna), avocado, regular mayo/mustard  and cheese.  A  Salad using blue cheese, ranch,  Goddess or vinagrette,  No croutons or "confetti" and no "candied nuts" but regular nuts OK.   No pretzels or chips.  Pickles and miniature sweet peppers are a good low carb alternative that provide a "crunch"  The bread is the only source of carbohydrate in a sandwich and  can be decreased by trying some of these alternatives to traditional loaf bread:  Joseph's makes a pita bread and a flat bread (called "Lavash") that are 50 cal and 4 net carbs available at [employer]Js, [employer].  This can be toasted to use with hummous as well  Toufayan makes a low carb flatbread that's 100 cal and 9 net carbs available at [employer] and BJ's makes 2 sizes of  Low carb whole wheat tortilla  (The large one is 210 cal and 6 net carbs)  Flat Out makes flatbreads that are low carb as well  aa folded flatbread called "Foldit" available at [place] "Low fat dressings, as well as [place] dressings They are loaded with sugar.  Ranch,  [employer], Goddess,  vinagrettes are all fine   3 PM/ Mid day  Snack:  Consider  1  ounce of  almonds, walnuts, pistachios, pecans, peanuts,  Macadamia nuts or a nut medley.  Avoid "granola"; the dried cranberries and raisins are loaded with carbohydrates. Mixed nuts as long as there are no raisins,  cranberries or dried fruit.    Try the prosciutto/mozzarella cheese sticks by [employer]  In deli /backery section   High protein   To avoid overindulging in snacks: Try drinking a glass of almond coconut milk light by [place] ( Or a cup of coffee with your Atkins chocolate bar to keep you from having 3!!!   Pork rinds!  Yes Pork Rinds        6 PM  Dinner:     Meat/fowl/fish with a green salad, and either broccoli, cauliflower, green beans, spinach, brussel sprouts or  Lima beans. DO NOT BREAD THE PROTEIN!!      There is a low carb pasta by Dreamfield's that is acceptable and tastes great: only 5 digestible  carbs/serving.( All grocery stores but BJs carry it )  There are frozen dinners that are low carb:  Try Michel Angelo's chicken piccata or chicken or eggplant parm over low carb pasta.(Lowes and BJs)   Marjory Lies Sanchez's "Carnitas" (pulled pork, no sauce,  0 carbs) or his beef pot roast to make a dinner burrito (at BJ's)  Pesto over low carb pasta (bj's sells a good quality pesto in the center refrigerated section of the deli   Try satueeing  Cheral Marker with mushroooms  Whole wheat pasta is still full of digestible carbs and  Not as low in glycemic index as Dreamfield's.   Brown rice is still rice,  So skip the rice and noodles if you eat [place] or [place] (or at least limit to 1/2 cup)  9 PM snack :   Breyer's "low carb" fudgsicle or  ice cream bar (Carb Smart line), or  Weight Watcher's ice cream bar , or another "no sugar added" ice cream;  a serving of fresh berries/cherries with whipped cream   Cheese or DANNON'S LlGHT N FIT GREEK YOGURT or the [place] greek yogurt   8 ounces of [place] unsweetened almond/cococunut milk  Cheese and crackers (using WASA crackers,  They are low carb) or peanut butter on low carb crackers or pita bread     Avoid bananas, pineapple, grapes  and watermelon on a regular basis because they are high in sugar.  THINK OF THEM AS DESSERT  Remember that snack Substitutions should be less than 10 NET carbs per serving and meals should be < 25 net carbs. Remember that carbohydrates from fiber do not affect blood sugar, so you can  subtract fiber grams to get the "net carbs " of any particular food item.   Health Maintenance Adopting a healthy lifestyle and getting preventive care can go a long way to promote health and wellness. Talk with your health care provider about what schedule of regular examinations is right for you. This is a good chance for you to check in with your provider about disease prevention and staying healthy. In between checkups, there are plenty of  things you can do on your own. Experts have done a lot of research about which lifestyle changes and preventive measures are most likely to keep you healthy. Ask your health care provider for more information. WEIGHT AND DIET  Eat a healthy diet  Be sure to include plenty of vegetables, fruits, low-fat dairy products, and lean protein.  Do not eat a lot of foods high in  solid fats, added sugars, or salt.  Get regular exercise. This is one of the most important things you can do for your health.  Most adults should exercise for at least 150 minutes each week. The exercise should increase your heart rate and make you sweat (moderate-intensity exercise).  Most adults should also do strengthening exercises at least twice a week. This is in addition to the moderate-intensity exercise.  Maintain a healthy weight  Body mass index (BMI) is a measurement that can be used to identify possible weight problems. It estimates body fat based on height and weight. Your health care provider can help determine your BMI and help you achieve or maintain a healthy weight.  For females 47 years of age and older:   A BMI below 18.5 is considered underweight.  A BMI of 18.5 to 24.9 is normal.  A BMI of 25 to 29.9 is considered overweight.  A BMI of 30 and above is considered obese.  Watch levels of cholesterol and blood lipids  You should start having your blood tested for lipids and cholesterol at 73 years of age, then have this test every 5 years.  You may need to have your cholesterol levels checked more often if:  Your lipid or cholesterol levels are high.  You are older than 73 years of age.  You are at high risk for heart disease.  CANCER SCREENING   Lung Cancer  Lung cancer screening is recommended for adults 58-39 years old who are at high risk for lung cancer because of a history of smoking.  A yearly low-dose CT scan of the lungs is recommended for people who:  Currently  smoke.  Have quit within the past 15 years.  Have at least a 30-pack-year history of smoking. A pack year is smoking an average of one pack of cigarettes a day for 1 year.  Yearly screening should continue until it has been 15 years since you quit.  Yearly screening should stop if you develop a health problem that would prevent you from having lung cancer treatment.  Breast Cancer  Practice breast self-awareness. This means understanding how your breasts normally appear and feel.  It also means doing regular breast self-exams. Let your health care provider know about any changes, no matter how small.  If you are in your 20s or 30s, you should have a clinical breast exam (CBE) by a health care provider every 1-3 years as part of a regular health exam.  If you are 35 or older, have a CBE every year. Also consider having a breast X-ray (mammogram) every year.  If you have a family history of breast cancer, talk to your health care provider about genetic screening.  If you are at high risk for breast cancer, talk to your health care provider about having an MRI and a mammogram every year.  Breast cancer gene (BRCA) assessment is recommended for women who have family members with BRCA-related cancers. BRCA-related cancers include:  Breast.  Ovarian.  Tubal.  Peritoneal cancers.  Results of the assessment will determine the need for genetic counseling and BRCA1 and BRCA2 testing. Cervical Cancer Routine pelvic examinations to screen for cervical cancer are no longer recommended for nonpregnant women who are considered low risk for cancer of the pelvic organs (ovaries, uterus, and vagina) and who do not have symptoms. A pelvic examination may be necessary if you have symptoms including those associated with pelvic infections. Ask your health care provider if a screening pelvic exam  is right for you.   The Pap test is the screening test for cervical cancer for women who are considered  at risk.  If you had a hysterectomy for a problem that was not cancer or a condition that could lead to cancer, then you no longer need Pap tests.  If you are older than 65 years, and you have had normal Pap tests for the past 10 years, you no longer need to have Pap tests.  If you have had past treatment for cervical cancer or a condition that could lead to cancer, you need Pap tests and screening for cancer for at least 20 years after your treatment.  If you no longer get a Pap test, assess your risk factors if they change (such as having a new sexual partner). This can affect whether you should start being screened again.  Some women have medical problems that increase their chance of getting cervical cancer. If this is the case for you, your health care provider may recommend more frequent screening and Pap tests.  The human papillomavirus (HPV) test is another test that may be used for cervical cancer screening. The HPV test looks for the virus that can cause cell changes in the cervix. The cells collected during the Pap test can be tested for HPV.  The HPV test can be used to screen women 23 years of age and older. Getting tested for HPV can extend the interval between normal Pap tests from three to five years.  An HPV test also should be used to screen women of any age who have unclear Pap test results.  After 73 years of age, women should have HPV testing as often as Pap tests.  Colorectal Cancer  This type of cancer can be detected and often prevented.  Routine colorectal cancer screening usually begins at 73 years of age and continues through 73 years of age.  Your health care provider may recommend screening at an earlier age if you have risk factors for colon cancer.  Your health care provider may also recommend using home test kits to check for hidden blood in the stool.  A small camera at the end of a tube can be used to examine your colon directly (sigmoidoscopy or  colonoscopy). This is done to check for the earliest forms of colorectal cancer.  Routine screening usually begins at age 93.  Direct examination of the colon should be repeated every 5-10 years through 73 years of age. However, you may need to be screened more often if early forms of precancerous polyps or small growths are found. Skin Cancer  Check your skin from head to toe regularly.  Tell your health care provider about any new moles or changes in moles, especially if there is a change in a mole's shape or color.  Also tell your health care provider if you have a mole that is larger than the size of a pencil eraser.  Always use sunscreen. Apply sunscreen liberally and repeatedly throughout the day.  Protect yourself by wearing long sleeves, pants, a wide-brimmed hat, and sunglasses whenever you are outside. HEART DISEASE, DIABETES, AND HIGH BLOOD PRESSURE   Have your blood pressure checked at least every 1-2 years. High blood pressure causes heart disease and increases the risk of stroke.  If you are between 60 years and 33 years old, ask your health care provider if you should take aspirin to prevent strokes.  Have regular diabetes screenings. This involves taking a blood sample to  check your fasting blood sugar level.  If you are at a normal weight and have a low risk for diabetes, have this test once every three years after 73 years of age.  If you are overweight and have a high risk for diabetes, consider being tested at a younger age or more often. PREVENTING INFECTION  Hepatitis B  If you have a higher risk for hepatitis B, you should be screened for this virus. You are considered at high risk for hepatitis B if:  You were born in a country where hepatitis B is common. Ask your health care provider which countries are considered high risk.  Your parents were born in a high-risk country, and you have not been immunized against hepatitis B (hepatitis B vaccine).  You have  HIV or AIDS.  You use needles to inject street drugs.  You live with someone who has hepatitis B.  You have had sex with someone who has hepatitis B.  You get hemodialysis treatment.  You take certain medicines for conditions, including cancer, organ transplantation, and autoimmune conditions. Hepatitis C  Blood testing is recommended for:  Everyone born from [birth year] through 1964.  Anyone with known risk factors for hepatitis C. Sexually transmitted infections (STIs)  You should be screened for sexually transmitted infections (STIs) including gonorrhea and chlamydia if:  You are sexually active and are younger than 73 years of age.  You are older than 73 years of age and your health care provider tells you that you are at risk for this type of infection.  Your sexual activity has changed since you were last screened and you are at an increased risk for chlamydia or gonorrhea. Ask your health care provider if you are at risk.  If you do not have HIV, but are at risk, it may be recommended that you take a prescription medicine daily to prevent HIV infection. This is called pre-exposure prophylaxis (PrEP). You are considered at risk if:  You are sexually active and do not regularly use condoms or know the HIV status of your partner(s).  You take drugs by injection.  You are sexually active with a partner who has HIV. Talk with your health care provider about whether you are at high risk of being infected with HIV. If you choose to begin PrEP, you should first be tested for HIV. You should then be tested every 3 months for as long as you are taking PrEP.  PREGNANCY   If you are premenopausal and you may become pregnant, ask your health care provider about preconception counseling.  If you may become pregnant, take 400 to 800 micrograms (mcg) of folic acid every day.  If you want to prevent pregnancy, talk to your health care provider about birth control  (contraception). OSTEOPOROSIS AND MENOPAUSE   Osteoporosis is a disease in which the bones lose minerals and strength with aging. This can result in serious bone fractures. Your risk for osteoporosis can be identified using a bone density scan.  If you are 33 years of age or older, or if you are at risk for osteoporosis and fractures, ask your health care provider if you should be screened.  Ask your health care provider whether you should take a calcium or vitamin D supplement to lower your risk for osteoporosis.  Menopause may have certain physical symptoms and risks.  Hormone replacement therapy may reduce some of these symptoms and risks. Talk to your health care provider about whether hormone replacement therapy is right  for you.  HOME CARE INSTRUCTIONS   Schedule regular health, dental, and eye exams.  Stay current with your immunizations.   Do not use any tobacco products including cigarettes, chewing tobacco, or electronic cigarettes.  If you are pregnant, do not drink alcohol.  If you are breastfeeding, limit how much and how often you drink alcohol.  Limit alcohol intake to no more than 1 drink per day for nonpregnant women. One drink equals 12 ounces of beer, 5 ounces of wine, or 1 ounces of hard liquor.  Do not use street drugs.  Do not share needles.  Ask your health care provider for help if you need support or information about quitting drugs.  Tell your health care provider if you often feel depressed.  Tell your health care provider if you have ever been abused or do not feel safe at home. Document Released: 12/25/2010 Document Revised: 10/26/2013 Document Reviewed: 05/13/2013 [facility] Patient Information 2015 [place], [place]. This information is not intended to replace advice given to you by your health care provider. Make sure you discuss any questions you have with your health care provider.

## 2015-03-24 NOTE — Progress Notes (Signed)
Pre-visit discussion using our clinic review tool. No additional management support is needed unless otherwise documented below in the visit note.  

## 2015-03-24 NOTE — Assessment & Plan Note (Signed)
Thyroid function is WNL on current dose.  No current changes needed.   Lab Results  Component Value Date   TSH 2.86 03/17/2014

## 2015-03-24 NOTE — Assessment & Plan Note (Signed)
I have addressed  BMI and recommended a low glycemic index diet utilizing smaller more frequent meals to increase metabolism.  I have also recommended that patient start exercising with a goal of 30 minutes of aerobic exercise a minimum of 5 days per week.  

## 2015-03-24 NOTE — Assessment & Plan Note (Signed)
Diagnosed in January 2015 withfasting glucose of 139 and aqc of 7.3  .  [place] diabetes seminar referral was made.  Her DM is  well-controlled on metformin alone.  Patient is up-to-date on eye exams and foot exam is normal today .   Patient is tolerating statin therapy for CAD risk reduction and her anti hypertensive was changed to  ACE/ARB for management of  miscroscopic proteinuria. Lab Results  Component Value Date   HGBA1C 7.1* 03/24/2015   Lab Results  Component Value Date   MICROALBUR 3.0* 03/24/2015

## 2015-03-24 NOTE — Assessment & Plan Note (Addendum)
Well controlled on current regimen. Renal function has decined slightly,  Will repeat in one month  Lab Results  Component Value Date   CREATININE 1.29* 03/24/2015   Lab Results  Component Value Date   NA 142 03/24/2015   K 4.1 03/24/2015   CL 103 03/24/2015   CO2 26 03/24/2015

## 2015-03-24 NOTE — Assessment & Plan Note (Addendum)
Presumed by ultrasound changes and serologies negative for autoimmune causes of hepatitis. Esophageal varices seen on prior CT .   Current liver enzymes are elevated and all modifiable risk factors including obesity, diabetes and hyperlipidemia have been addressed .  She will be advised to have hepatitis A/B vaccines done and referral to [facility].    Lab Results  Component Value Date   ALT 57* 03/24/2015   AST 39* 03/24/2015   ALKPHOS 45 03/24/2015   BILITOT 0.5 03/24/2015

## 2015-03-24 NOTE — Assessment & Plan Note (Signed)

## 2015-03-25 ENCOUNTER — Other Ambulatory Visit: Payer: Self-pay | Admitting: Internal Medicine

## 2015-03-25 LAB — HIV ANTIBODY (ROUTINE TESTING W REFLEX): HIV 1&2 Ab, 4th Generation: NONREACTIVE

## 2015-03-25 LAB — HEPATITIS C ANTIBODY: HCV Ab: NEGATIVE

## 2015-03-29 ENCOUNTER — Other Ambulatory Visit: Payer: Self-pay | Admitting: Internal Medicine

## 2015-03-31 ENCOUNTER — Other Ambulatory Visit: Payer: Self-pay

## 2015-03-31 MED ORDER — SIMVASTATIN 40 MG PO TABS: 40.0000 mg | ORAL_TABLET | Freq: Every day | ORAL | Status: DC

## 2015-03-31 NOTE — Telephone Encounter (Signed)
Pt sent mychart message wanting a refill on simvastatin.

## 2015-03-31 NOTE — Addendum Note (Signed)
Addended by: Nanci Pina on: 03/31/2015 08:19 AM   Modules accepted: Miquel Dunn

## 2015-04-05 ENCOUNTER — Other Ambulatory Visit: Payer: Self-pay | Admitting: Internal Medicine

## 2015-07-22 ENCOUNTER — Emergency Department: Payer: PPO

## 2015-07-22 ENCOUNTER — Encounter: Payer: Self-pay | Admitting: Emergency Medicine

## 2015-07-22 ENCOUNTER — Emergency Department
Admission: EM | Admit: 2015-07-22 | Discharge: 2015-07-22 | Disposition: A | Discharge status: Home / Self Care | Payer: PPO | Attending: Emergency Medicine | Admitting: Emergency Medicine

## 2015-07-22 DIAGNOSIS — Y998 Other external cause status: Secondary | ICD-10-CM | POA: Diagnosis not present

## 2015-07-22 DIAGNOSIS — Z7984 Long term (current) use of oral hypoglycemic drugs: Secondary | ICD-10-CM | POA: Diagnosis not present

## 2015-07-22 DIAGNOSIS — Y9389 Activity, other specified: Secondary | ICD-10-CM | POA: Diagnosis not present

## 2015-07-22 DIAGNOSIS — Z79899 Other long term (current) drug therapy: Secondary | ICD-10-CM | POA: Insufficient documentation

## 2015-07-22 DIAGNOSIS — Y92009 Unspecified place in unspecified non-institutional (private) residence as the place of occurrence of the external cause: Secondary | ICD-10-CM | POA: Insufficient documentation

## 2015-07-22 DIAGNOSIS — S42302A Unspecified fracture of shaft of humerus, left arm, initial encounter for closed fracture: Secondary | ICD-10-CM | POA: Diagnosis not present

## 2015-07-22 DIAGNOSIS — S4992XA Unspecified injury of left shoulder and upper arm, initial encounter: Secondary | ICD-10-CM | POA: Diagnosis present

## 2015-07-22 DIAGNOSIS — W010XXA Fall on same level from slipping, tripping and stumbling without subsequent striking against object, initial encounter: Secondary | ICD-10-CM | POA: Insufficient documentation

## 2015-07-22 DIAGNOSIS — S42392A Other fracture of shaft of left humerus, initial encounter for closed fracture: Secondary | ICD-10-CM | POA: Insufficient documentation

## 2015-07-22 DIAGNOSIS — E119 Type 2 diabetes mellitus without complications: Secondary | ICD-10-CM | POA: Diagnosis not present

## 2015-07-22 DIAGNOSIS — I10 Essential (primary) hypertension: Secondary | ICD-10-CM | POA: Insufficient documentation

## 2015-07-22 DIAGNOSIS — S42352A Displaced comminuted fracture of shaft of humerus, left arm, initial encounter for closed fracture: Secondary | ICD-10-CM | POA: Diagnosis not present

## 2015-07-22 MED ORDER — OXYCODONE-ACETAMINOPHEN 5-325 MG PO TABS: 1.0000 | ORAL_TABLET | Freq: Four times a day (QID) | ORAL | Status: DC | PRN

## 2015-07-22 MED ORDER — OXYCODONE-ACETAMINOPHEN 5-325 MG PO TABS
1.0000 | ORAL_TABLET | Freq: Once | ORAL | Status: AC
  Administered 2015-07-22: 1 via ORAL
  Filled 2015-07-22: qty 1

## 2015-07-22 MED ORDER — OXYCODONE-ACETAMINOPHEN 5-325 MG PO TABS
1.0000 | ORAL_TABLET | Freq: Once | ORAL | Status: AC
  Administered 2015-07-22: 1 via ORAL

## 2015-07-22 MED ORDER — ONDANSETRON 4 MG PO TBDP: 4.0000 mg | ORAL_TABLET | Freq: Three times a day (TID) | ORAL | Status: DC | PRN

## 2015-07-22 MED ORDER — OXYCODONE-ACETAMINOPHEN 5-325 MG PO TABS
ORAL_TABLET | ORAL | Status: AC
  Administered 2015-07-22: 1 via ORAL
  Filled 2015-07-22: qty 1

## 2015-07-22 NOTE — ED Provider Notes (Signed)
[facility] Emergency Department Provider Note  ____________________________________________    I have reviewed the triage vital signs and the nursing notes.   HISTORY  Chief Complaint Arm Pain    HPI Marissa Rodriguez is a 75 y.o. female who presents after a fall with complaints of left arm pain. Patient reports she tripped over a bed and landed on her left arm. She complains of pain in the mid to distal humerus on the left. Deformity noted. Normal sensation in her fingers on the left hand. No other injuries reported     Past Medical History  Diagnosis Date  . Hypertension   . Hyperlipidemia   . History of MRI of brain and brain stem May 2012    normal MRI/MRA done to rule out CVA  . Tubular adenoma of colon Nov 2010    repeat due 2015  . Hypothyroidism     Patient Active Problem List   Diagnosis Date Noted  . S/P TAH-BSO (total abdominal hysterectomy and bilateral salpingo-oophorectomy) 03/17/2014  . Hepatic steatosis 12/10/2012  . Diabetes mellitus type 2 in obese ([place]) 12/10/2012  . Medicare annual wellness visit, subsequent 09/09/2012  . History of pneumonia 10/14/2011  . Obesity (BMI 30-39.9) 10/14/2011  . Secondary esophageal varices without bleeding ([place]) 10/14/2011  . History of MRI of brain and brain stem   . Tubular adenoma of colon   . Hypertension 09/24/2011  . Hypothyroidism 09/24/2011  . Hyperlipidemia     Past Surgical History  Procedure Laterality Date  . Abdominal hysterectomy  1975    TAH/LSO, wedge resection of right  . Appendectomy  1975    incidental, done during TAH    Current Outpatient Rx  Name  Route  Sig  Dispense  Refill  . levothyroxine (SYNTHROID, LEVOTHROID) 88 MCG tablet      TAKE 1 TABLET EVERY DAY   90 tablet   3   . lisinopril (PRINIVIL,ZESTRIL) 40 MG tablet      TAKE 1 TABLET EVERY DAY   90 tablet   3   . metFORMIN (GLUCOPHAGE) 500 MG tablet      TAKE 1 TABLET TWICE DAILY WITH MEALS   180  tablet   0   . ondansetron (ZOFRAN ODT) 4 MG disintegrating tablet   Oral   Take 1 tablet (4 mg total) by mouth every 8 (eight) hours as needed for nausea or vomiting.   20 tablet   0   . oxyCODONE-acetaminophen (ROXICET) 5-325 MG tablet   Oral   Take 1 tablet by mouth every 6 (six) hours as needed for moderate pain.   30 tablet   0   . simvastatin (ZOCOR) 40 MG tablet   Oral   Take 1 tablet (40 mg total) by mouth daily.   90 tablet   2   . TDaP (BOOSTRIX) 5-2.5-18.5 LF-MCG/0.5 injection   Intramuscular   Inject 0.5 mLs into the muscle once.   0.5 mL   0     Allergies Review of patient's allergies indicates no known allergies.  Family History  Problem Relation Age of Onset  . Cancer Father     Lung     Social History Social History  Substance Use Topics  . Smoking status: Never Smoker   . Smokeless tobacco: Never Used  . Alcohol Use: No    Review of Systems  Constitutional: Negative for fever. Eyes: Negative for visual changes. ENT: Negative for sore throat Cardiovascular: Negative for chest pain. Respiratory: Negative  for shortness of breath. Gastrointestinal: Negative for abdominal pain Genitourinary: Negative for dysuria. Musculoskeletal: Negative for back pain. Negative for hip pain. Positive for left arm pain Skin: Negative for rash. Neurological: Negative for headaches  Psychiatric: No anxiety    ____________________________________________   PHYSICAL EXAM:  VITAL SIGNS: ED Triage Vitals  Enc Vitals Group     BP 07/22/15 1125 161/69 mmHg     Pulse Rate 07/22/15 1125 85     Resp --      Temp 07/22/15 1125 97.8 F (36.6 C)     Temp Source 07/22/15 1125 Oral     SpO2 07/22/15 1125 96 %     Weight 07/22/15 1125 197 lb (89.359 kg)     Height 07/22/15 1125 5\' 5"  (1.651 m)     Head Cir --      Peak Flow --      Pain Score 07/22/15 1125 3     Pain Loc --      Pain Edu? --      Excl. in [place]? --      Constitutional: Alert and  oriented. Well appearing and in no distress. Eyes: Conjunctivae are normal.  ENT   Head: Normocephalic and atraumatic.   Mouth/Throat: Mucous membranes are moist. Cardiovascular: Normal rate, regular rhythm. Normal and symmetric distal pulses are present in all extremities. No murmurs, rubs, or gallops. Respiratory: Normal respiratory effort without tachypnea nor retractions. Breath sounds are clear and equal bilaterally.  Gastrointestinal: Soft and non-tender in all quadrants. No distention. There is no CVA tenderness. Genitourinary: deferred Musculoskeletal: Deformity noted to mid left humerus. 2+ distal pulses. Normal sensation in the left hand and normal range of motion . No tenderness at the left or right shoulders, no vertebral tenderness to palpation. No lower extremity tenderness nor edema. Neurologic:  Normal speech and language. No gross focal neurologic deficits are appreciated. Skin:  Skin is warm, dry and intact. No rash noted. Psychiatric: Mood and affect are normal. Patient exhibits appropriate insight and judgment.  ____________________________________________    LABS (pertinent positives/negatives)  Labs Reviewed - No data to display  ____________________________________________   EKG  None  ____________________________________________    RADIOLOGY I have personally reviewed any xrays that were ordered on this patient: Left humerus shows displaced  comminuted fracture  ____________________________________________   PROCEDURES  Procedure(s) performed: yes, SPLINT APPLICATION Date/Time: 99991111 PM Authorized by: Lavonia Drafts Consent: Verbal consent obtained. Risks and benefits: risks, benefits and alternatives were discussed Consent given by: patient Splint applied by:  technician Location details: left arm Splint type: sugartong Supplies used: orthoglass Post-procedure: The splinted body part was neurovascularly unchanged following the  procedure. Patient tolerance: Patient tolerated the procedure well with no immediate complications.     Critical Care performed: none  ____________________________________________   INITIAL IMPRESSION / ASSESSMENT AND PLAN / ED COURSE  Pertinent labs & imaging results that were available during my care of the patient were reviewed by me and considered in my medical decision making (see chart for details).  Patient with displaced mildly angulated comminuted fracture of the midshaft of the left humerus status post fall. I discussed this with Dr. Mack Guise who studied the films and recommended sugar tong splint with shoulder immobilizer to provide mild traction and he will see her in the office.  After the splint was placed I confirmed that she is neurovascular intact.  Patient to follow-up with Dr. Mack Guise this week.  ____________________________________________   FINAL CLINICAL IMPRESSION(S) / ED DIAGNOSES  Final  diagnoses:  Humerus shaft fracture, left, closed, initial encounter     Lavonia Drafts, MD 07/22/15 [phone]

## 2015-07-22 NOTE — ED Notes (Addendum)
Patient presents to the ED with painful left forearm after tripping inside her home.  Patient is ambulatory without difficulty at this time.  Arm appears slightly deformed and swollen.  Patient is holding arm and appears to be in significant pain.  Patient denies losing consciousness after the fall and denies hitting the back of her head.  Patient does have an abrasion to her left cheek.  Patient denies any pain other than her arm.

## 2015-07-22 NOTE — ED Notes (Signed)
Splint applied from the superior lateral humerus to the superior anterior humerus

## 2015-07-22 NOTE — Discharge Instructions (Signed)
Humerus Fracture Treated With Immobilization °The humerus is the large bone in your upper arm. You have a broken (fractured) humerus. These fractures are easily diagnosed with X-rays. °TREATMENT  °Simple fractures which will heal without disability are treated with simple immobilization. Immobilization means you will wear a cast, splint, or sling. You have a fracture which will do well with immobilization. The fracture will heal well simply by being held in a good position until it is stable enough to begin range of motion exercises. Do not take part in activities which would further injure your arm.  °HOME CARE INSTRUCTIONS  °· Put ice on the injured area. °¨ Put ice in a plastic bag. °¨ Place a towel between your skin and the bag. °¨ Leave the ice on for 15-20 minutes, 03-04 times a day. °· If you have a cast: °¨ Do not scratch the skin under the cast using sharp or pointed objects. °¨ Check the skin around the cast every day. You may put lotion on any red or sore areas. °¨ Keep your cast dry and clean. °· If you have a splint: °¨ Wear the splint as directed. °¨ Keep your splint dry and clean. °¨ You may loosen the elastic around the splint if your fingers become numb, tingle, or turn cold or blue. °· If you have a sling: °¨ Wear the sling as directed. °· Do not put pressure on any part of your cast or splint until it is fully hardened. °· Your cast or splint can be protected during bathing with a plastic bag. Do not lower the cast or splint into water. °· Only take over-the-counter or prescription medicines for pain, discomfort, or fever as directed by your caregiver. °· Do range of motion exercises as instructed by your caregiver. °· Follow up as directed by your caregiver. This is very important in order to avoid permanent injury or disability and chronic pain. °SEEK IMMEDIATE MEDICAL CARE IF:  °· Your skin or nails in the injured arm turn blue or gray. °· Your arm feels cold or numb. °· You develop severe pain  in the injured arm. °· You are having problems with the medicines you were given. °MAKE SURE YOU:  °· Understand these instructions. °· Will watch your condition. °· Will get help right away if you are not doing well or get worse. °  °This information is not intended to replace advice given to you by your health care provider. Make sure you discuss any questions you have with your health care provider. °  °Document Released: 09/17/2000 Document Revised: 07/02/2014 Document Reviewed: 11/03/2014 °Elsevier Interactive Patient Education ©2016 [employer] Inc[employer] ° °

## 2015-07-25 DIAGNOSIS — S42355A Nondisplaced comminuted fracture of shaft of humerus, left arm, initial encounter for closed fracture: Secondary | ICD-10-CM | POA: Diagnosis not present

## 2015-08-03 DIAGNOSIS — S42212D Unspecified displaced fracture of surgical neck of left humerus, subsequent encounter for fracture with routine healing: Secondary | ICD-10-CM | POA: Diagnosis not present

## 2015-08-10 DIAGNOSIS — S42352A Displaced comminuted fracture of shaft of humerus, left arm, initial encounter for closed fracture: Secondary | ICD-10-CM | POA: Diagnosis not present

## 2015-08-25 ENCOUNTER — Telehealth: Payer: Self-pay | Admitting: Internal Medicine

## 2015-08-25 DIAGNOSIS — Z8781 Personal history of (healed) traumatic fracture: Secondary | ICD-10-CM

## 2015-08-25 NOTE — Telephone Encounter (Signed)
Recent arm fracture noted.  Has not had DEXA scan since 2008.  Will order/ MyChart message sent

## 2015-08-26 ENCOUNTER — Telehealth: Payer: Self-pay

## 2015-08-26 NOTE — Telephone Encounter (Signed)
Pt notified of Dr. Lupita Dawn comments pt verbalized understanding

## 2015-08-26 NOTE — Telephone Encounter (Signed)
Left voicemail for patient to check MYChart.

## 2015-08-31 DIAGNOSIS — S42352D Displaced comminuted fracture of shaft of humerus, left arm, subsequent encounter for fracture with routine healing: Secondary | ICD-10-CM | POA: Diagnosis not present

## 2015-09-06 ENCOUNTER — Telehealth: Payer: Self-pay | Admitting: Internal Medicine

## 2015-09-06 MED ORDER — LEVOTHYROXINE SODIUM 88 MCG PO TABS: 88.0000 ug | ORAL_TABLET | Freq: Every day | ORAL | Status: DC

## 2015-09-06 MED ORDER — LISINOPRIL 40 MG PO TABS: 40.0000 mg | ORAL_TABLET | Freq: Every day | ORAL | Status: DC

## 2015-09-06 MED ORDER — SIMVASTATIN 40 MG PO TABS: 40.0000 mg | ORAL_TABLET | Freq: Every day | ORAL | Status: DC

## 2015-09-06 MED ORDER — METFORMIN HCL 500 MG PO TABS: 500.0000 mg | ORAL_TABLET | Freq: Two times a day (BID) | ORAL | Status: DC

## 2015-09-06 NOTE — Telephone Encounter (Signed)
Pt called needing a refill for medications metFORMIN (GLUCOPHAGE) 500 MG tablet, simvastatin (ZOCOR) 40 MG tablet, lisinopril (PRINIVIL,ZESTRIL) 40 MG tablet and levothyroxine (SYNTHROID, LEVOTHROID) 88 MCG tablet. Pharmacy is [facility] [place], [place]. Pt wants to know if her medications can be refilled or does she need to come in? Call pt @ [phone]. Thank you!

## 2015-09-06 NOTE — Telephone Encounter (Signed)
Pt has made an 6 month appt, pt will be seen 09/22/15 at 11am. Pt given a refill until f/u appt

## 2015-09-14 ENCOUNTER — Ambulatory Visit (INDEPENDENT_AMBULATORY_CARE_PROVIDER_SITE_OTHER): Payer: Commercial Managed Care - HMO | Admitting: Ophthalmology

## 2015-09-22 ENCOUNTER — Encounter: Payer: Self-pay | Admitting: Internal Medicine

## 2015-09-22 ENCOUNTER — Ambulatory Visit (INDEPENDENT_AMBULATORY_CARE_PROVIDER_SITE_OTHER): Payer: PPO | Admitting: Internal Medicine

## 2015-09-22 VITALS — BP 108/68 | HR 79 | Temp 98.2°F | Resp 12 | Ht 65.0 in | Wt 198.5 lb

## 2015-09-22 DIAGNOSIS — S42202D Unspecified fracture of upper end of left humerus, subsequent encounter for fracture with routine healing: Secondary | ICD-10-CM

## 2015-09-22 DIAGNOSIS — S42202S Unspecified fracture of upper end of left humerus, sequela: Secondary | ICD-10-CM

## 2015-09-22 DIAGNOSIS — S42201S Unspecified fracture of upper end of right humerus, sequela: Secondary | ICD-10-CM

## 2015-09-22 DIAGNOSIS — E785 Hyperlipidemia, unspecified: Secondary | ICD-10-CM

## 2015-09-22 DIAGNOSIS — K760 Fatty (change of) liver, not elsewhere classified: Secondary | ICD-10-CM

## 2015-09-22 DIAGNOSIS — F4321 Adjustment disorder with depressed mood: Secondary | ICD-10-CM

## 2015-09-22 DIAGNOSIS — E1169 Type 2 diabetes mellitus with other specified complication: Secondary | ICD-10-CM

## 2015-09-22 DIAGNOSIS — E038 Other specified hypothyroidism: Secondary | ICD-10-CM

## 2015-09-22 DIAGNOSIS — E034 Atrophy of thyroid (acquired): Secondary | ICD-10-CM

## 2015-09-22 DIAGNOSIS — F4320 Adjustment disorder, unspecified: Secondary | ICD-10-CM

## 2015-09-22 DIAGNOSIS — Z23 Encounter for immunization: Secondary | ICD-10-CM

## 2015-09-22 DIAGNOSIS — Z7289 Other problems related to lifestyle: Secondary | ICD-10-CM

## 2015-09-22 DIAGNOSIS — E119 Type 2 diabetes mellitus without complications: Secondary | ICD-10-CM | POA: Diagnosis not present

## 2015-09-22 DIAGNOSIS — E669 Obesity, unspecified: Secondary | ICD-10-CM

## 2015-09-22 DIAGNOSIS — E559 Vitamin D deficiency, unspecified: Secondary | ICD-10-CM

## 2015-09-22 DIAGNOSIS — I10 Essential (primary) hypertension: Secondary | ICD-10-CM

## 2015-09-22 LAB — COMPREHENSIVE METABOLIC PANEL WITH GFR
ALT: 20 U/L (ref 0–35)
AST: 24 U/L (ref 0–37)
Albumin: 4.4 g/dL (ref 3.5–5.2)
Alkaline Phosphatase: 60 U/L (ref 39–117)
BUN: 17 mg/dL (ref 6–23)
CO2: 27 meq/L (ref 19–32)
Calcium: 10 mg/dL (ref 8.4–10.5)
Chloride: 103 meq/L (ref 96–112)
Creatinine, Ser: 0.85 mg/dL (ref 0.40–1.20)
GFR: 69.54 mL/min
Glucose, Bld: 116 mg/dL — ABNORMAL HIGH (ref 70–99)
Potassium: 4.4 meq/L (ref 3.5–5.1)
Sodium: 140 meq/L (ref 135–145)
Total Bilirubin: 0.6 mg/dL (ref 0.2–1.2)
Total Protein: 7.6 g/dL (ref 6.0–8.3)

## 2015-09-22 LAB — LIPID PANEL
Cholesterol: 177 mg/dL (ref 0–200)
HDL: 38.5 mg/dL — ABNORMAL LOW
LDL Cholesterol: 111 mg/dL — ABNORMAL HIGH (ref 0–99)
NonHDL: 138.71
Total CHOL/HDL Ratio: 5
Triglycerides: 141 mg/dL (ref 0.0–149.0)
VLDL: 28.2 mg/dL (ref 0.0–40.0)

## 2015-09-22 LAB — LDL CHOLESTEROL, DIRECT: Direct LDL: 113 mg/dL

## 2015-09-22 LAB — HEMOGLOBIN A1C: Hgb A1c MFr Bld: 6.2 % (ref 4.6–6.5)

## 2015-09-22 LAB — VITAMIN D 25 HYDROXY (VIT D DEFICIENCY, FRACTURES): VITD: 24.01 ng/mL — ABNORMAL LOW (ref 30.00–100.00)

## 2015-09-22 LAB — TSH: TSH: 1.23 u[IU]/mL (ref 0.35–4.50)

## 2015-09-22 NOTE — Progress Notes (Signed)
Pre-visit discussion using our clinic review tool. No additional management support is needed unless otherwise documented below in the visit note.  

## 2015-09-22 NOTE — Patient Instructions (Signed)
I am so sorry about the loss of your husband.  Please let me know if I can help you through this in any way  You have a viral syndrome which is causing bronchitis.    The post nasal drip may also be contributing to  your  Cough.   I  advise use of the following OTC meds to help with your other symptoms.    generic OTC benadryl 25 mg  At bedtime for the drainage,, Delsym for daytime cough. flush your sinuses twice daily with Milta Deiters Meds sinus sinse   Your coughing may last 7 to 10 days and you may develop a low grade fever ( T > 100.4,  les than 102) but if you develop Green nasal discharge,  Ear  Or facial pain, call for an  antibiotic

## 2015-09-22 NOTE — Progress Notes (Signed)
Subjective:  Patient ID: Marissa Rodriguez, female    DOB: 17-Sep-1940  Age: 75 y.o. MRN: [id number]  CC: The primary encounter diagnosis was Closed fracture of part of upper end of humerus, left, sequela. Diagnoses of Need for prophylactic vaccination against Streptococcus pneumoniae (pneumococcus), Hyperlipidemia, Hypothyroidism due to acquired atrophy of thyroid, Diabetes mellitus type 2 in obese ([place]), Hepatic steatosis, Other problems related to lifestyle, Grief reaction, Essential hypertension, Closed fracture of part of upper end of humerus, right, sequela, and Vitamin D deficiency were also pertinent to this visit.  HPI Marissa Rodriguez presents for 6 month follow up on hypothyroidism  Obesity and Type 2 DM.    She has had a difficutl time sinc eher last visit.  Her husband died in D 30-May-2023 after 38 years of marriage.  In January she suffered a fracture x 3 of her left humerus, which has been managed by [employer]  .  Last DEXA  Never  Has kept the 12 lbs off since May 30, 2023 but has not lost any more .  Has not veen   Outpatient Prescriptions Prior to Visit  Medication Sig Dispense Refill  . levothyroxine (SYNTHROID, LEVOTHROID) 88 MCG tablet Take 1 tablet (88 mcg total) by mouth daily. 30 tablet 0  . lisinopril (PRINIVIL,ZESTRIL) 40 MG tablet Take 1 tablet (40 mg total) by mouth daily. 30 tablet 0  . metFORMIN (GLUCOPHAGE) 500 MG tablet Take 1 tablet (500 mg total) by mouth 2 (two) times daily with a meal. 60 tablet 0  . simvastatin (ZOCOR) 40 MG tablet Take 1 tablet (40 mg total) by mouth daily. 30 tablet 0  . ondansetron (ZOFRAN ODT) 4 MG disintegrating tablet Take 1 tablet (4 mg total) by mouth every 8 (eight) hours as needed for nausea or vomiting. (Patient not taking: Reported on 09/22/2015) 20 tablet 0  . oxyCODONE-acetaminophen (ROXICET) 5-325 MG tablet Take 1 tablet by mouth every 6 (six) hours as needed for moderate pain. (Patient not taking: Reported on 09/22/2015) 30 tablet 0  .  TDaP (BOOSTRIX) 5-2.5-18.5 LF-MCG/0.5 injection Inject 0.5 mLs into the muscle once. 0.5 mL 0   No facility-administered medications prior to visit.    Review of Systems;  Patient denies headache, fevers, malaise, unintentional weight loss, skin rash, eye pain, sinus congestion and sinus pain, sore throat, dysphagia,  hemoptysis , cough, dyspnea, wheezing, chest pain, palpitations, orthopnea, edema, abdominal pain, nausea, melena, diarrhea, constipation, flank pain, dysuria, hematuria, urinary  Frequency, nocturia, numbness, tingling, seizures,  Focal weakness, Loss of consciousness,  Tremor, insomnia, depression, anxiety, and suicidal ideation.      Objective:  BP 108/68 mmHg  Pulse 79  Temp(Src) 98.2 F (36.8 C) (Oral)  Resp 12  Ht 5\' 5"  (1.651 m)  Wt 198 lb 8 oz (90.039 kg)  BMI 33.03 kg/m2  SpO2 98%  BP Readings from Last 3 Encounters:  09/22/15 108/68  07/22/15 141/82  03/24/15 122/68    Wt Readings from Last 3 Encounters:  09/22/15 198 lb 8 oz (90.039 kg)  07/22/15 197 lb (89.359 kg)  03/24/15 210 lb 6 oz (95.425 kg)    General appearance: alert, cooperative and appears stated age Ears: normal TM's and external ear canals both ears Throat: lips, mucosa, and tongue normal; teeth and gums normal Neck: no adenopathy, no carotid bruit, supple, symmetrical, trachea midline and thyroid not enlarged, symmetric, no tenderness/mass/nodules Back: symmetric, no curvature. ROM normal. No CVA tenderness. Lungs: clear to auscultation bilaterally Heart: regular rate and rhythm, S1,  S2 normal, no murmur, click, rub or gallop Abdomen: soft, non-tender; bowel sounds normal; no masses,  no organomegaly Pulses: 2+ and symmetric Skin: Skin color, texture, turgor normal. No rashes or lesions Lymph nodes: Cervical, supraclavicular, and axillary nodes normal.  Lab Results  Component Value Date   HGBA1C 6.2 09/22/2015   HGBA1C 7.1* 03/24/2015   HGBA1C 7.1* 03/17/2014    Lab Results    Component Value Date   CREATININE 0.85 09/22/2015   CREATININE 1.29* 03/24/2015   CREATININE 1.1 03/17/2014    Lab Results  Component Value Date   WBC 8.1 09/09/2012   HGB 14.0 09/09/2012   HCT 41.2 09/09/2012   PLT 266.0 09/09/2012   GLUCOSE 116* 09/22/2015   CHOL 177 09/22/2015   TRIG 141.0 09/22/2015   HDL 38.50* 09/22/2015   LDLDIRECT 113.0 09/22/2015   LDLCALC 111* 09/22/2015   ALT 20 09/22/2015   AST 24 09/22/2015   NA 140 09/22/2015   K 4.4 09/22/2015   CL 103 09/22/2015   CREATININE 0.85 09/22/2015   BUN 17 09/22/2015   CO2 27 09/22/2015   TSH 1.23 09/22/2015   HGBA1C 6.2 09/22/2015   MICROALBUR 3.0* 03/24/2015    Dg Humerus Left  07/22/2015  CLINICAL DATA:  Fall, tripped over bed rail today, left humeral pain EXAM: LEFT HUMERUS - 2+ VIEW COMPARISON:  None. FINDINGS: Two views of the left humerus submitted. There is displaced mild angulated comminuted fracture mid shaft of the left humerus. A fracture line is extending in proximal left humeral shaft. IMPRESSION: Displaced mild angulated comminuted fracture mid shaft of the left humerus. Electronically Signed   By: Lahoma Crocker M.D.   On: 07/22/2015 12:39    Assessment & Plan:   Problem List Items Addressed This Visit    Hyperlipidemia    LDL and triglycerides are at goal on current medications. She has no side effects and liver enzymes are stable, elevated due to NASH. Marland Kitchen No changes today   Lab Results  Component Value Date   CHOL 177 09/22/2015   HDL 38.50* 09/22/2015   LDLCALC 111* 09/22/2015   LDLDIRECT 113.0 09/22/2015   TRIG 141.0 09/22/2015   CHOLHDL 5 09/22/2015   Lab Results  Component Value Date   ALT 20 09/22/2015   AST 24 09/22/2015   ALKPHOS 60 09/22/2015   BILITOT 0.6 09/22/2015            Relevant Orders   LDL cholesterol, direct (Completed)   Lipid panel (Completed)   Hypertension    Well controlled on current regimen. Renal function stable, no changes today.  Lab Results   Component Value Date   CREATININE 0.85 09/22/2015   Lab Results  Component Value Date   NA 140 09/22/2015   K 4.4 09/22/2015   CL 103 09/22/2015   CO2 27 09/22/2015         Hypothyroidism    Thyroid function is WNL on current dose.  No current changes needed.   Lab Results  Component Value Date   TSH 1.23 09/22/2015             Relevant Orders   TSH (Completed)   Diabetes mellitus type 2 in obese [facility])    Diagnosed in January 2015 withfasting glucose of 139 and a1c of 7.3  .  [place] diabetes seminar referral was made.  Her DM is  well-controlled on metformin alone.  Patient is up-to-date on eye exams and foot exam is normal today .  Patient is tolerating statin therapy for CAD risk reduction and her anti hypertensive was changed to  ACE/ARB for management of  microscopic proteinuria. Lab Results  Component Value Date   HGBA1C 6.2 09/22/2015   Lab Results  Component Value Date   MICROALBUR 3.0* 03/24/2015             Relevant Orders   Hemoglobin A1c (Completed)   Grief reaction    Patient is dealing with the unexpected loss of spouse and has adequate coping skills and emotional support .  i have asked patinet to return in one month to examine for signs of unresolving grief.       Closed fracture of part of upper end of humerus - Primary    Managed conservatively .  DEXA scan and vitamin D ordered      Relevant Orders   DG Bone Density   VITAMIN D 25 Hydroxy (Vit-D Deficiency, Fractures) (Completed)   Vitamin D deficiency    Given recent fracture .  Vit d will be replaced.       Hepatic steatosis   Relevant Orders   Comprehensive metabolic panel (Completed)    Other Visit Diagnoses    Need for prophylactic vaccination against Streptococcus pneumoniae (pneumococcus)        Other problems related to lifestyle        Relevant Orders    HIV antibody (Completed)      A total of 40 minutes was spent with patient more than half of which  was spent in counseling patient on the above mentioned issues , reviewing and explaining recent labs and imaging studies done, and coordination of care.   I have discontinued Ms. Moad Tdap. I am also having her start on ergocalciferol. Additionally, I am having her maintain her oxyCODONE-acetaminophen, ondansetron, metFORMIN, simvastatin, lisinopril, and levothyroxine.  Meds ordered this encounter  Medications  . ergocalciferol (DRISDOL) 50000 units capsule    Sig: Take 1 capsule (50,000 Units total) by mouth once a week.    Dispense:  12 capsule    Refill:  0    Medications Discontinued During This Encounter  Medication Reason  . TDaP (BOOSTRIX) 5-2.5-18.5 LF-MCG/0.5 injection Completed Course    Follow-up: No Follow-up on file.   Crecencio Mc, MD

## 2015-09-23 LAB — HIV ANTIBODY (ROUTINE TESTING W REFLEX): HIV 1&2 Ab, 4th Generation: NONREACTIVE

## 2015-09-24 ENCOUNTER — Encounter: Payer: Self-pay | Admitting: Internal Medicine

## 2015-09-24 DIAGNOSIS — F4320 Adjustment disorder, unspecified: Secondary | ICD-10-CM | POA: Insufficient documentation

## 2015-09-24 DIAGNOSIS — S42209A Unspecified fracture of upper end of unspecified humerus, initial encounter for closed fracture: Secondary | ICD-10-CM

## 2015-09-24 DIAGNOSIS — E559 Vitamin D deficiency, unspecified: Secondary | ICD-10-CM | POA: Insufficient documentation

## 2015-09-24 DIAGNOSIS — F4321 Adjustment disorder with depressed mood: Secondary | ICD-10-CM | POA: Insufficient documentation

## 2015-09-24 HISTORY — DX: Unspecified fracture of upper end of unspecified humerus, initial encounter for closed fracture: S42.209A

## 2015-09-24 MED ORDER — ERGOCALCIFEROL 1.25 MG (50000 UT) PO CAPS: 50000.0000 [IU] | ORAL_CAPSULE | ORAL | Status: DC

## 2015-09-24 NOTE — Assessment & Plan Note (Signed)
Diagnosed in January 2015 withfasting glucose of 139 and a1c of 7.3  .  [place] diabetes seminar referral was made.  Her DM is  well-controlled on metformin alone.  Patient is up-to-date on eye exams and foot exam is normal today .   Patient is tolerating statin therapy for CAD risk reduction and her anti hypertensive was changed to  ACE/ARB for management of  microscopic proteinuria. Lab Results  Component Value Date   HGBA1C 6.2 09/22/2015   Lab Results  Component Value Date   MICROALBUR 3.0* 03/24/2015

## 2015-09-24 NOTE — Assessment & Plan Note (Signed)
Thyroid function is WNL on current dose.  No current changes needed.   Lab Results  Component Value Date   TSH 1.23 09/22/2015

## 2015-09-24 NOTE — Assessment & Plan Note (Signed)
Well controlled on current regimen. Renal function stable, no changes today.  Lab Results  Component Value Date   CREATININE 0.85 09/22/2015   Lab Results  Component Value Date   NA 140 09/22/2015   K 4.4 09/22/2015   CL 103 09/22/2015   CO2 27 09/22/2015

## 2015-09-24 NOTE — Assessment & Plan Note (Signed)
Managed conservatively .  DEXA scan and vitamin D ordered

## 2015-09-24 NOTE — Assessment & Plan Note (Signed)
Patient is dealing with the unexpected loss of spouse and has adequate coping skills and emotional support .  i have asked patinet to return in one month to examine for signs of unresolving grief.   

## 2015-09-24 NOTE — Assessment & Plan Note (Signed)
LDL and triglycerides are at goal on current medications. She has no side effects and liver enzymes are stable, elevated due to NASH. Marland Kitchen No changes today   Lab Results  Component Value Date   CHOL 177 09/22/2015   HDL 38.50* 09/22/2015   LDLCALC 111* 09/22/2015   LDLDIRECT 113.0 09/22/2015   TRIG 141.0 09/22/2015   CHOLHDL 5 09/22/2015   Lab Results  Component Value Date   ALT 20 09/22/2015   AST 24 09/22/2015   ALKPHOS 60 09/22/2015   BILITOT 0.6 09/22/2015

## 2015-09-24 NOTE — Assessment & Plan Note (Signed)
Given recent fracture .  Vit d will be replaced.

## 2015-09-26 ENCOUNTER — Other Ambulatory Visit: Payer: Self-pay | Admitting: Internal Medicine

## 2015-09-26 ENCOUNTER — Encounter: Payer: Self-pay | Admitting: Internal Medicine

## 2015-09-28 DIAGNOSIS — S42352D Displaced comminuted fracture of shaft of humerus, left arm, subsequent encounter for fracture with routine healing: Secondary | ICD-10-CM | POA: Diagnosis not present

## 2015-10-03 ENCOUNTER — Other Ambulatory Visit: Payer: Self-pay | Admitting: Internal Medicine

## 2015-10-03 ENCOUNTER — Encounter: Payer: Self-pay | Admitting: Internal Medicine

## 2015-10-03 MED ORDER — OXYCODONE-ACETAMINOPHEN 5-325 MG PO TABS: 1.0000 | ORAL_TABLET | Freq: Four times a day (QID) | ORAL | Status: AC | PRN

## 2015-10-03 NOTE — Telephone Encounter (Signed)
Patient asking for Percocet.

## 2015-10-04 DIAGNOSIS — M25512 Pain in left shoulder: Secondary | ICD-10-CM | POA: Diagnosis not present

## 2015-10-04 DIAGNOSIS — S42209D Unspecified fracture of upper end of unspecified humerus, subsequent encounter for fracture with routine healing: Secondary | ICD-10-CM | POA: Diagnosis not present

## 2015-10-06 DIAGNOSIS — S42209D Unspecified fracture of upper end of unspecified humerus, subsequent encounter for fracture with routine healing: Secondary | ICD-10-CM | POA: Diagnosis not present

## 2015-10-06 DIAGNOSIS — M25512 Pain in left shoulder: Secondary | ICD-10-CM | POA: Diagnosis not present

## 2015-10-10 DIAGNOSIS — M25512 Pain in left shoulder: Secondary | ICD-10-CM | POA: Diagnosis not present

## 2015-10-10 DIAGNOSIS — S42209D Unspecified fracture of upper end of unspecified humerus, subsequent encounter for fracture with routine healing: Secondary | ICD-10-CM | POA: Diagnosis not present

## 2015-10-12 DIAGNOSIS — M25512 Pain in left shoulder: Secondary | ICD-10-CM | POA: Diagnosis not present

## 2015-10-12 DIAGNOSIS — S42209D Unspecified fracture of upper end of unspecified humerus, subsequent encounter for fracture with routine healing: Secondary | ICD-10-CM | POA: Diagnosis not present

## 2015-10-17 DIAGNOSIS — S42209D Unspecified fracture of upper end of unspecified humerus, subsequent encounter for fracture with routine healing: Secondary | ICD-10-CM | POA: Diagnosis not present

## 2015-10-17 DIAGNOSIS — M25512 Pain in left shoulder: Secondary | ICD-10-CM | POA: Diagnosis not present

## 2015-10-19 DIAGNOSIS — S42209D Unspecified fracture of upper end of unspecified humerus, subsequent encounter for fracture with routine healing: Secondary | ICD-10-CM | POA: Diagnosis not present

## 2015-10-19 DIAGNOSIS — M25512 Pain in left shoulder: Secondary | ICD-10-CM | POA: Diagnosis not present

## 2015-10-23 ENCOUNTER — Other Ambulatory Visit: Payer: Self-pay | Admitting: Internal Medicine

## 2015-10-24 DIAGNOSIS — S42209D Unspecified fracture of upper end of unspecified humerus, subsequent encounter for fracture with routine healing: Secondary | ICD-10-CM | POA: Diagnosis not present

## 2015-10-24 DIAGNOSIS — M25512 Pain in left shoulder: Secondary | ICD-10-CM | POA: Diagnosis not present

## 2015-10-24 MED ORDER — SIMVASTATIN 40 MG PO TABS: 40.0000 mg | ORAL_TABLET | Freq: Every day | ORAL | Status: DC

## 2015-10-24 MED ORDER — LEVOTHYROXINE SODIUM 88 MCG PO TABS: 88.0000 ug | ORAL_TABLET | Freq: Every day | ORAL | Status: DC

## 2015-10-24 MED ORDER — LISINOPRIL 40 MG PO TABS: 40.0000 mg | ORAL_TABLET | Freq: Every day | ORAL | Status: DC

## 2015-10-24 MED ORDER — METFORMIN HCL 500 MG PO TABS: 500.0000 mg | ORAL_TABLET | Freq: Two times a day (BID) | ORAL | Status: DC

## 2015-10-24 NOTE — Addendum Note (Signed)
Addended by: Leeanne Rio on: 10/24/2015 04:59 PM   Modules accepted: Orders

## 2015-10-26 DIAGNOSIS — S42352D Displaced comminuted fracture of shaft of humerus, left arm, subsequent encounter for fracture with routine healing: Secondary | ICD-10-CM | POA: Diagnosis not present

## 2015-10-31 DIAGNOSIS — S42209D Unspecified fracture of upper end of unspecified humerus, subsequent encounter for fracture with routine healing: Secondary | ICD-10-CM | POA: Diagnosis not present

## 2015-10-31 DIAGNOSIS — M25512 Pain in left shoulder: Secondary | ICD-10-CM | POA: Diagnosis not present

## 2016-04-18 DIAGNOSIS — E113551 Type 2 diabetes mellitus with stable proliferative diabetic retinopathy, right eye: Secondary | ICD-10-CM | POA: Diagnosis not present

## 2016-04-18 DIAGNOSIS — H35033 Hypertensive retinopathy, bilateral: Secondary | ICD-10-CM | POA: Diagnosis not present

## 2016-04-18 DIAGNOSIS — E113292 Type 2 diabetes mellitus with mild nonproliferative diabetic retinopathy without macular edema, left eye: Secondary | ICD-10-CM | POA: Diagnosis not present

## 2016-04-18 DIAGNOSIS — H40013 Open angle with borderline findings, low risk, bilateral: Secondary | ICD-10-CM | POA: Diagnosis not present

## 2016-05-10 ENCOUNTER — Telehealth: Payer: Self-pay | Admitting: *Deleted

## 2016-05-10 MED ORDER — BENZONATATE 200 MG PO CAPS: 200.0000 mg | ORAL_CAPSULE | Freq: Three times a day (TID) | ORAL | 0 refills | Status: DC | PRN

## 2016-05-10 NOTE — Telephone Encounter (Signed)
No, only if narcotics or antibiotics are requested.  Ladona Ridgel sent to pharmacy

## 2016-05-10 NOTE — Telephone Encounter (Signed)
Does patient need to be seen?   Please advise

## 2016-05-10 NOTE — Telephone Encounter (Signed)
Patient has been informed.

## 2016-05-10 NOTE — Telephone Encounter (Signed)
Pt currently has a command cold with a cough,no fever. Pt requested a cough medicine be called into her pharmacy. Pt contact [facility] on [address]

## 2016-08-10 DIAGNOSIS — X32XXXA Exposure to sunlight, initial encounter: Secondary | ICD-10-CM | POA: Diagnosis not present

## 2016-08-10 DIAGNOSIS — L821 Other seborrheic keratosis: Secondary | ICD-10-CM | POA: Diagnosis not present

## 2016-08-10 DIAGNOSIS — L814 Other melanin hyperpigmentation: Secondary | ICD-10-CM | POA: Diagnosis not present

## 2016-08-10 DIAGNOSIS — D2271 Melanocytic nevi of right lower limb, including hip: Secondary | ICD-10-CM | POA: Diagnosis not present

## 2016-08-10 DIAGNOSIS — L570 Actinic keratosis: Secondary | ICD-10-CM | POA: Diagnosis not present

## 2016-08-10 DIAGNOSIS — D225 Melanocytic nevi of trunk: Secondary | ICD-10-CM | POA: Diagnosis not present

## 2016-08-30 ENCOUNTER — Telehealth: Payer: Self-pay | Admitting: *Deleted

## 2016-08-30 DIAGNOSIS — E119 Type 2 diabetes mellitus without complications: Secondary | ICD-10-CM

## 2016-08-30 NOTE — Telephone Encounter (Signed)
Rec refill requests for Lisinopril 40 mg, Simvastatin 40 mg and Glucophage 500 mg all for 90 day supplies.  Last OV 09/22/15 w/ labs No existing w/ you  Ok to Rf for 90 days each?

## 2016-08-30 NOTE — Telephone Encounter (Signed)
Left detailed mess informing pt of below and to call us back to schedule OV w/ non fasting labs. Labs are already ordered.

## 2016-08-30 NOTE — Telephone Encounter (Signed)
NO,  REFILLS UNTIL LABS DONE SHE IS A OVERDUE FOR  NONFASTING LABS,  BY 3  MONTHS NEEDS cmet AND A1C PRIOR TO ANY REFILLS.  ORDERED  ASAP

## 2016-09-03 ENCOUNTER — Telehealth: Payer: Self-pay | Admitting: Internal Medicine

## 2016-09-03 NOTE — Telephone Encounter (Signed)
Left pt message asking to call Allison back directly at [phone] to schedule AWV. Thanks! °

## 2016-10-17 ENCOUNTER — Ambulatory Visit (INDEPENDENT_AMBULATORY_CARE_PROVIDER_SITE_OTHER): Payer: PPO | Admitting: Ophthalmology

## 2016-10-17 DIAGNOSIS — E113392 Type 2 diabetes mellitus with moderate nonproliferative diabetic retinopathy without macular edema, left eye: Secondary | ICD-10-CM

## 2016-10-17 DIAGNOSIS — E11319 Type 2 diabetes mellitus with unspecified diabetic retinopathy without macular edema: Secondary | ICD-10-CM | POA: Diagnosis not present

## 2016-10-17 DIAGNOSIS — H35033 Hypertensive retinopathy, bilateral: Secondary | ICD-10-CM | POA: Diagnosis not present

## 2016-10-17 DIAGNOSIS — I10 Essential (primary) hypertension: Secondary | ICD-10-CM | POA: Diagnosis not present

## 2016-10-17 DIAGNOSIS — H43813 Vitreous degeneration, bilateral: Secondary | ICD-10-CM

## 2016-10-17 DIAGNOSIS — E113591 Type 2 diabetes mellitus with proliferative diabetic retinopathy without macular edema, right eye: Secondary | ICD-10-CM | POA: Diagnosis not present

## 2016-10-17 LAB — HM DIABETES EYE EXAM

## 2016-10-19 NOTE — Telephone Encounter (Signed)
Left pt message asking to call Allison back directly at [phone] to schedule AWV. Thanks! °

## 2016-10-19 NOTE — Telephone Encounter (Signed)
Scheduled 11/07/16 °

## 2016-10-30 ENCOUNTER — Other Ambulatory Visit: Payer: Self-pay | Admitting: Internal Medicine

## 2016-11-01 NOTE — Addendum Note (Signed)
Addended by: Adair Laundry on: 11/01/2016 03:14 PM   Modules accepted: Orders

## 2016-11-01 NOTE — Telephone Encounter (Signed)
Pt called requesting these refills. She has an appt scheduled for Wednesday 5/16. Please advise, thank you!  [place], [place]  Call pt @ [phone]

## 2016-11-01 NOTE — Telephone Encounter (Signed)
Looks like these medications were refused on 10/31/2016. Pt has scheduled an appt on 11/07/2016. Is it ok to refill for one month? Please advise.

## 2016-11-02 MED ORDER — METFORMIN HCL 500 MG PO TABS: 500.0000 mg | ORAL_TABLET | Freq: Two times a day (BID) | ORAL | 0 refills | Status: DC

## 2016-11-02 MED ORDER — LISINOPRIL 40 MG PO TABS: 40.0000 mg | ORAL_TABLET | Freq: Every day | ORAL | 0 refills | Status: DC

## 2016-11-02 MED ORDER — SIMVASTATIN 40 MG PO TABS: 40.0000 mg | ORAL_TABLET | Freq: Every day | ORAL | 0 refills | Status: DC

## 2016-11-02 MED ORDER — LEVOTHYROXINE SODIUM 88 MCG PO TABS: 88.0000 ug | ORAL_TABLET | Freq: Every day | ORAL | 0 refills | Status: DC

## 2016-11-02 NOTE — Telephone Encounter (Signed)
Yes, but they each had 11 refills added which I nixed,  30 days only , sent

## 2016-11-02 NOTE — Addendum Note (Signed)
Addended by: Crecencio Mc on: 11/02/2016 01:11 PM   Modules accepted: Orders

## 2016-11-07 ENCOUNTER — Ambulatory Visit (INDEPENDENT_AMBULATORY_CARE_PROVIDER_SITE_OTHER): Payer: PPO | Admitting: Internal Medicine

## 2016-11-07 ENCOUNTER — Ambulatory Visit: Payer: PPO

## 2016-11-07 ENCOUNTER — Encounter: Payer: Self-pay | Admitting: Internal Medicine

## 2016-11-07 VITALS — BP 124/72 | HR 79 | Temp 97.8°F | Resp 15 | Ht 65.0 in | Wt 208.8 lb

## 2016-11-07 DIAGNOSIS — I8510 Secondary esophageal varices without bleeding: Secondary | ICD-10-CM

## 2016-11-07 DIAGNOSIS — E782 Mixed hyperlipidemia: Secondary | ICD-10-CM | POA: Diagnosis not present

## 2016-11-07 DIAGNOSIS — E669 Obesity, unspecified: Secondary | ICD-10-CM

## 2016-11-07 DIAGNOSIS — E559 Vitamin D deficiency, unspecified: Secondary | ICD-10-CM | POA: Diagnosis not present

## 2016-11-07 DIAGNOSIS — E034 Atrophy of thyroid (acquired): Secondary | ICD-10-CM | POA: Diagnosis not present

## 2016-11-07 DIAGNOSIS — S42201S Unspecified fracture of upper end of right humerus, sequela: Secondary | ICD-10-CM | POA: Diagnosis not present

## 2016-11-07 DIAGNOSIS — K760 Fatty (change of) liver, not elsewhere classified: Secondary | ICD-10-CM | POA: Diagnosis not present

## 2016-11-07 DIAGNOSIS — E2839 Other primary ovarian failure: Secondary | ICD-10-CM | POA: Diagnosis not present

## 2016-11-07 DIAGNOSIS — F4320 Adjustment disorder, unspecified: Secondary | ICD-10-CM

## 2016-11-07 DIAGNOSIS — Z1239 Encounter for other screening for malignant neoplasm of breast: Secondary | ICD-10-CM

## 2016-11-07 DIAGNOSIS — F4321 Adjustment disorder with depressed mood: Secondary | ICD-10-CM

## 2016-11-07 DIAGNOSIS — E1169 Type 2 diabetes mellitus with other specified complication: Secondary | ICD-10-CM

## 2016-11-07 DIAGNOSIS — Z1231 Encounter for screening mammogram for malignant neoplasm of breast: Secondary | ICD-10-CM

## 2016-11-07 DIAGNOSIS — E119 Type 2 diabetes mellitus without complications: Secondary | ICD-10-CM

## 2016-11-07 LAB — LIPID PANEL
Cholesterol: 176 mg/dL (ref 0–200)
HDL: 37.2 mg/dL — ABNORMAL LOW
LDL Cholesterol: 104 mg/dL — ABNORMAL HIGH (ref 0–99)
NonHDL: 139.25
Total CHOL/HDL Ratio: 5
Triglycerides: 175 mg/dL — ABNORMAL HIGH (ref 0.0–149.0)
VLDL: 35 mg/dL (ref 0.0–40.0)

## 2016-11-07 LAB — COMPREHENSIVE METABOLIC PANEL WITH GFR
ALT: 25 U/L (ref 0–35)
AST: 24 U/L (ref 0–37)
Albumin: 4.4 g/dL (ref 3.5–5.2)
Alkaline Phosphatase: 37 U/L — ABNORMAL LOW (ref 39–117)
BUN: 33 mg/dL — ABNORMAL HIGH (ref 6–23)
CO2: 27 meq/L (ref 19–32)
Calcium: 10 mg/dL (ref 8.4–10.5)
Chloride: 106 meq/L (ref 96–112)
Creatinine, Ser: 1.09 mg/dL (ref 0.40–1.20)
GFR: 52.03 mL/min — ABNORMAL LOW
Glucose, Bld: 151 mg/dL — ABNORMAL HIGH (ref 70–99)
Potassium: 4.8 meq/L (ref 3.5–5.1)
Sodium: 139 meq/L (ref 135–145)
Total Bilirubin: 0.4 mg/dL (ref 0.2–1.2)
Total Protein: 7.6 g/dL (ref 6.0–8.3)

## 2016-11-07 LAB — VITAMIN D 25 HYDROXY (VIT D DEFICIENCY, FRACTURES): VITD: 32.3 ng/mL (ref 30.00–100.00)

## 2016-11-07 LAB — TSH: TSH: 2.24 u[IU]/mL (ref 0.35–4.50)

## 2016-11-07 LAB — MICROALBUMIN / CREATININE URINE RATIO
Creatinine,U: 179.2 mg/dL
Microalb Creat Ratio: 1.8 mg/g (ref 0.0–30.0)
Microalb, Ur: 3.3 mg/dL — ABNORMAL HIGH (ref 0.0–1.9)

## 2016-11-07 LAB — HEMOGLOBIN A1C: Hgb A1c MFr Bld: 6.9 % — ABNORMAL HIGH (ref 4.6–6.5)

## 2016-11-07 NOTE — Progress Notes (Signed)
Subjective:  Patient ID: Marissa Rodriguez, female    DOB: [birth year]  Age: 76 y.o. MRN: [id number]  CC: The primary encounter diagnosis was Estrogen deficiency. Diagnoses of Screening breast examination, Vitamin D deficiency, Hepatic steatosis, Diabetes mellitus type 2 in obese ([place]), Hypothyroidism due to acquired atrophy of thyroid, Mixed hyperlipidemia, Obesity (BMI 30-39.9), Closed fracture of proximal end of right humerus, unspecified fracture morphology, sequela, Grief reaction, Secondary esophageal varices without bleeding ([place]), and NAFLD (nonalcoholic fatty liver disease) were also pertinent to this visit.  HPI Marissa Rodriguez presents for follow up on type 2 diabetes, hypertension,  Hypothyroidism and obesity .  She has been lost to follow up for one year .  Last seen march 2017 for follow up on closed fracture of left humerus.  She reports  Full ROM demonstrated today and no pain.   (fell over a bedframe, no falls since)   Trouble initiating sleep due to anxiety. And loneliness. Marland Kitchen Her husband of 25 years die one year ago, she has deferred grief counselling. gets up and stress eats at night. Nuts, crackers and cheese  DM:  No labs in over a year.  Does not check blood sugars.  Foot exam normal  Eye exam up to date    Mammogram ordered for July and dexa too    Hypertension:  Ran out of blood pressure medications for a period of 3 days due to being lost to follow up renal function monitoring.  Has resumed meds   Dry cough noticed during OV  Happens every Spring/summer.   Fasting  Today   Outpatient Medications Prior to Visit  Medication Sig Dispense Refill  . levothyroxine (SYNTHROID, LEVOTHROID) 88 MCG tablet Take 1 tablet (88 mcg total) by mouth daily. 30 tablet 0  . lisinopril (PRINIVIL,ZESTRIL) 40 MG tablet Take 1 tablet (40 mg total) by mouth daily. 30 tablet 0  . metFORMIN (GLUCOPHAGE) 500 MG tablet Take 1 tablet (500 mg total) by mouth 2 (two) times daily with a meal. 60  tablet 0  . ondansetron (ZOFRAN ODT) 4 MG disintegrating tablet Take 1 tablet (4 mg total) by mouth every 8 (eight) hours as needed for nausea or vomiting. 20 tablet 0  . simvastatin (ZOCOR) 40 MG tablet Take 1 tablet (40 mg total) by mouth daily. 30 tablet 0  . benzonatate (TESSALON) 200 MG capsule Take 1 capsule (200 mg total) by mouth 3 (three) times daily as needed for cough. (Patient not taking: Reported on 11/07/2016) 60 capsule 0  . ergocalciferol (DRISDOL) 50000 units capsule Take 1 capsule (50,000 Units total) by mouth once a week. (Patient not taking: Reported on 11/07/2016) 12 capsule 0   No facility-administered medications prior to visit.     Review of Systems;  Patient denies headache, fevers, malaise, unintentional weight loss, skin rash, eye pain, sinus congestion and sinus pain, sore throat, dysphagia,  hemoptysis , cough, dyspnea, wheezing, chest pain, palpitations, orthopnea, edema, abdominal pain, nausea, melena, diarrhea, constipation, flank pain, dysuria, hematuria, urinary  Frequency, nocturia, numbness, tingling, seizures,  Focal weakness, Loss of consciousness,  Tremor, insomnia, depression, anxiety, and suicidal ideation.      Objective:  BP 124/72 (BP Location: Left Arm, Patient Position: Sitting, Cuff Size: Large)   Pulse 79   Temp 97.8 F (36.6 C) (Oral)   Resp 15   Ht 5\' 5"  (1.651 m)   Wt 208 lb 12.8 oz (94.7 kg)   SpO2 97%   BMI 34.75 kg/m   BP Readings  from Last 3 Encounters:  11/07/16 124/72  09/22/15 108/68  07/22/15 (!) 141/82    Wt Readings from Last 3 Encounters:  11/07/16 208 lb 12.8 oz (94.7 kg)  09/22/15 198 lb 8 oz (90 kg)  07/22/15 197 lb (89.4 kg)    General appearance: alert, cooperative and appears stated age Ears: normal TM's and external ear canals both ears Throat: lips, mucosa, and tongue normal; teeth and gums normal Neck: no adenopathy, no carotid bruit, supple, symmetrical, trachea midline and thyroid not enlarged, symmetric,  no tenderness/mass/nodules Back: symmetric, no curvature. ROM normal. No CVA tenderness. Lungs: clear to auscultation bilaterally Heart: regular rate and rhythm, S1, S2 normal, no murmur, click, rub or gallop Abdomen: soft, non-tender; bowel sounds normal; no masses,  no organomegaly Pulses: 2+ and symmetric Skin: Skin color, texture, turgor normal. No rashes or lesions Lymph nodes: Cervical, supraclavicular, and axillary nodes normal.  Lab Results  Component Value Date   HGBA1C 6.9 (H) 11/07/2016   HGBA1C 6.2 09/22/2015   HGBA1C 7.1 (H) 03/24/2015    Lab Results  Component Value Date   CREATININE 1.09 11/07/2016   CREATININE 0.85 09/22/2015   CREATININE 1.29 (H) 03/24/2015    Lab Results  Component Value Date   WBC 8.1 09/09/2012   HGB 14.0 09/09/2012   HCT 41.2 09/09/2012   PLT 266.0 09/09/2012   GLUCOSE 151 (H) 11/07/2016   CHOL 176 11/07/2016   TRIG 175.0 (H) 11/07/2016   HDL 37.20 (L) 11/07/2016   LDLDIRECT 113.0 09/22/2015   LDLCALC 104 (H) 11/07/2016   ALT 25 11/07/2016   AST 24 11/07/2016   NA 139 11/07/2016   K 4.8 11/07/2016   CL 106 11/07/2016   CREATININE 1.09 11/07/2016   BUN 33 (H) 11/07/2016   CO2 27 11/07/2016   TSH 2.24 11/07/2016   HGBA1C 6.9 (H) 11/07/2016   MICROALBUR 3.3 (H) 11/07/2016    Assessment & Plan:   Problem List Items Addressed This Visit    Vitamin D deficiency   Relevant Orders   VITAMIN D 25 Hydroxy (Vit-D Deficiency, Fractures) (Completed)   Secondary esophageal varices without bleeding ([place])    Found during 2011 admission for rectal bleeding secondary to colitis, ischemic versus diverticular bleed. She underwent a CT of the abdomen and pelvis which was concerning for paraesophageal mass which by EGD turn out to be esophageal varices. Varices are presumed to be due to NASH but she has not had liver biopsy. .she has not had any episodes of GI bleed and bp is well controlled.        Obesity (BMI 30-39.9)    I have addressed   10 lb weight gain and BMI and recommended wt loss of 10% of body weigh over the next 6 months using a low glycemic index diet and regular exercise a minimum of 5 days per week.        NAFLD (nonalcoholic fatty liver disease)    Presumed by ultrasound changes and serologies negative for autoimmune causes of hepatitis. Esophageal varices seen on prior CT .   Current liver enzymes are normalized  and all modifiable risk factors including obesity, diabetes and hyperlipidemia have been addressed .  She will be advised to have hepatitis A/B vaccines done and referral to Liver Clinic.    Lab Results  Component Value Date   ALT 25 11/07/2016   AST 24 11/07/2016   ALKPHOS 37 (L) 11/07/2016   BILITOT 0.4 11/07/2016  Hypothyroidism    Thyroid function is WNL on current dose.  No current changes needed.   Lab Results  Component Value Date   TSH 2.24 11/07/2016         Relevant Orders   TSH (Completed)   Hyperlipidemia    LDL and triglycerides are at goal on current medications. She has no side effects and liver enzymes are stable, elevated due to NAFLD . No changes today   Lab Results  Component Value Date   CHOL 176 11/07/2016   HDL 37.20 (L) 11/07/2016   LDLCALC 104 (H) 11/07/2016   LDLDIRECT 113.0 09/22/2015   TRIG 175.0 (H) 11/07/2016   CHOLHDL 5 11/07/2016   Lab Results  Component Value Date   ALT 25 11/07/2016   AST 24 11/07/2016   ALKPHOS 37 (L) 11/07/2016   BILITOT 0.4 11/07/2016            Relevant Orders   Lipid panel (Completed)   Grief reaction    She continues to defer grief counselling and use of medication for what now appears to be complicated grief/depression .       Diabetes mellitus type 2 in obese [facility])    Diagnosed in January 2015 withfasting glucose of 139 and a1c of 7.3  .  [place] diabetes seminar referral was made.  Her DM is  well-controlled on metformin alone.  Patient is up-to-date on eye exams and foot exam is normal  today .   Patient is tolerating statin therapy for CAD risk reduction and ACE I for management of  microscopic proteinuria. Lab Results  Component Value Date   HGBA1C 6.9 (H) 11/07/2016   Lab Results  Component Value Date   MICROALBUR 3.3 (H) 11/07/2016             Relevant Orders   Microalbumin / creatinine urine ratio (Completed)   Hemoglobin A1c (Completed)   Closed fracture of part of upper end of humerus    She has regained full ROM of arm.        Other Visit Diagnoses    Estrogen deficiency    -  Primary   Relevant Orders   DG Bone Density   Screening breast examination       Relevant Orders   MM SCREENING BREAST TOMO BILATERAL      I have discontinued Ms. Lovins ergocalciferol. I am also having her maintain her ondansetron, benzonatate, levothyroxine, lisinopril, metFORMIN, and simvastatin.  No orders of the defined types were placed in this encounter.   Medications Discontinued During This Encounter  Medication Reason  . ergocalciferol (DRISDOL) 50000 units capsule Therapy completed    Follow-up: Return in about 6 months (around 05/10/2017) for follow up diabetes.   Crecencio Mc, MD

## 2016-11-07 NOTE — Patient Instructions (Addendum)
Look for the [place].(crackers or internation food aisle)  low carb good quality   Try taking benadryl (dipenhydramine) 25 mg one hour befroe bedtime for insomnia and the  ough    There are plenty of high protein low carb cookies,  But they're not called "cookies."  Look for them in the diet section  where the protein shakes are  Sold.   All of these have 5 g sugar or less : Power crunch Atkins bars KIND :thE  "low glycemic index"  variety QUEST : (taste better after being microwaved for 5 seconds OUT OF THE WRAPPER)     Diabetes Mellitus and Standards of Medical Care Managing diabetes (diabetes mellitus) can be complicated. Your diabetes treatment may be managed by a team of health care providers, including:  A diet and nutrition specialist (registered dietitian).  A nurse.  A certified diabetes educator (CDE).  A diabetes specialist (endocrinologist).  An eye doctor.  A primary care provider.  A dentist. Your health care providers follow a schedule in order to help you get the best quality of care. The following schedule is a general guideline for your diabetes management plan. Your health care providers may also give you more specific instructions. HbA1c ( hemoglobin A1c) test This test provides information about blood sugar (glucose) control over the previous 2-3 months. It is used to check whether your diabetes management plan needs to be adjusted.  If you are meeting your treatment goals, this test is done at least 2 times a year.  If you are not meeting treatment goals or if your treatment goals have changed, this test is done 4 times a year. Blood pressure test  This test is done at every routine medical visit. For most people, the goal is less than 130/80. Ask your health care provider what your goal blood pressure should be. Dental and eye exams  Visit your dentist two times a year.  If you have type 1 diabetes, get an eye exam 3-5 years after you are  diagnosed, and then once a year after your first exam.  If you were diagnosed with type 1 diabetes as a child, get an eye exam when you are age 4 or older and have had diabetes for 3-5 years. After the first exam, you should get an eye exam once a year.  If you have type 2 diabetes, have an eye exam as soon as you are diagnosed, and then once a year after your first exam. Foot care exam  Visual foot exams are done at every routine medical visit. The exams check for cuts, bruises, redness, blisters, sores, or other problems with the feet.  A complete foot exam is done by your health care provider once a year. This exam includes an inspection of the structure and skin of your feet, and a check of the pulses and sensation in your feet.  Type 1 diabetes: Get your first exam 3-5 years after diagnosis.  Type 2 diabetes: Get your first exam as soon as you are diagnosed.  Check your feet every day for cuts, bruises, redness, blisters, or sores. If you have any of these or other problems that are not healing, contact your health care provider. Kidney function test ( urine microalbumin)  This test is done once a year.  Type 1 diabetes: Get your first test 5 years after diagnosis.  Type 2 diabetes: Get your first test as soon as you are diagnosed.  If you have chronic kidney disease (  CKD), get a serum creatinine and estimated glomerular filtration rate (eGFR) test once a year. Lipid profile (cholesterol, HDL, LDL, triglycerides)  This test should be done when you are diagnosed with diabetes, and every 5 years after the first test. If you are on medicines to lower your cholesterol, you may need to get this test done every year.  The goal for LDL is less than 100 mg/dL (5.5 mmol/L). If you are at high risk, the goal is less than 70 mg/dL (3.9 mmol/L).    The goal for triglycerides is less than 150 mg/dL (8.3 mmol/L). Immunizations  The yearly flu (influenza) vaccine is recommended for  everyone 6 months or older who has diabetes.  The pneumonia (pneumococcal) vaccine is recommended for everyone 2 years or older who has diabetes. If you are 26 or older, you may get the pneumonia vaccine as a series of two separate shots.  The hepatitis B vaccine is recommended for adults shortly after they have been diagnosed with diabetes.     Mental and emotional health  Screening for symptoms of eating disorders, anxiety, and depression is recommended at the time of diagnosis and afterward as needed. If your screening shows that you have symptoms (you have a positive screening result), you may need further evaluation and be referred to a mental health care provider. Diabetes self-management education  Education about how to manage your diabetes is recommended at diagnosis and ongoing as needed. Treatment plan  Your treatment plan will be reviewed at every medical visit. Summary  Managing diabetes (diabetes mellitus) can be complicated. Your diabetes treatment may be managed by a team of health care providers.  Your health care providers follow a schedule in order to help you get the best quality of care.  Standards of care including having regular physical exams, blood tests, blood pressure monitoring, immunizations, screening tests, and education about how to manage your diabetes.  Your health care providers may also give you more specific instructions based on your individual health. This information is not intended to replace advice given to you by your health care provider. Make sure you discuss any questions you have with your health care provider. Document Released: 04/08/2009 Document Revised: 03/09/2016 Document Reviewed: 03/09/2016 Elsevier Interactive Patient Education  2017 [employer].   .

## 2016-11-08 ENCOUNTER — Encounter: Payer: Self-pay | Admitting: Internal Medicine

## 2016-11-08 NOTE — Assessment & Plan Note (Signed)
Thyroid function is WNL on current dose.  No current changes needed.   Lab Results  Component Value Date   TSH 2.24 11/07/2016

## 2016-11-08 NOTE — Assessment & Plan Note (Addendum)
I have addressed  10 lb weight gain and BMI and recommended wt loss of 10% of body weigh over the next 6 months using a low glycemic index diet and regular exercise a minimum of 5 days per week.

## 2016-11-08 NOTE — Assessment & Plan Note (Signed)
Found during 2011 admission for rectal bleeding secondary to colitis, ischemic versus diverticular bleed. She underwent a CT of the abdomen and pelvis which was concerning for paraesophageal mass which by EGD turn out to be esophageal varices. Varices are presumed to be due to NASH but she has not had liver biopsy. .she has not had any episodes of GI bleed and bp is well controlled.

## 2016-11-08 NOTE — Assessment & Plan Note (Addendum)
Diagnosed in January 2015 withfasting glucose of 139 and a1c of 7.3  .  [place] diabetes seminar referral was made.  Her DM is  well-controlled on metformin alone.  Patient is up-to-date on eye exams and foot exam is normal today .   Patient is tolerating statin therapy for CAD risk reduction and ACE I for management of  microscopic proteinuria. Lab Results  Component Value Date   HGBA1C 6.9 (H) 11/07/2016   Lab Results  Component Value Date   MICROALBUR 3.3 (H) 11/07/2016

## 2016-11-08 NOTE — Assessment & Plan Note (Signed)
Presumed by ultrasound changes and serologies negative for autoimmune causes of hepatitis. Esophageal varices seen on prior CT .   Current liver enzymes are normalized  and all modifiable risk factors including obesity, diabetes and hyperlipidemia have been addressed .  She will be advised to have hepatitis A/B vaccines done and referral to Liver Clinic.    Lab Results  Component Value Date   ALT 25 11/07/2016   AST 24 11/07/2016   ALKPHOS 37 (L) 11/07/2016   BILITOT 0.4 11/07/2016

## 2016-11-08 NOTE — Assessment & Plan Note (Signed)
She continues to defer grief counselling and use of medication for what now appears to be complicated grief/depression .

## 2016-11-08 NOTE — Assessment & Plan Note (Signed)
She has regained full ROM of arm.

## 2016-11-08 NOTE — Assessment & Plan Note (Signed)
LDL and triglycerides are at goal on current medications. She has no side effects and liver enzymes are stable, elevated due to NAFLD . No changes today   Lab Results  Component Value Date   CHOL 176 11/07/2016   HDL 37.20 (L) 11/07/2016   LDLCALC 104 (H) 11/07/2016   LDLDIRECT 113.0 09/22/2015   TRIG 175.0 (H) 11/07/2016   CHOLHDL 5 11/07/2016   Lab Results  Component Value Date   ALT 25 11/07/2016   AST 24 11/07/2016   ALKPHOS 37 (L) 11/07/2016   BILITOT 0.4 11/07/2016

## 2016-11-15 ENCOUNTER — Other Ambulatory Visit: Payer: Self-pay

## 2016-11-15 MED ORDER — LISINOPRIL 40 MG PO TABS: 40.0000 mg | ORAL_TABLET | Freq: Every day | ORAL | 1 refills | Status: DC

## 2016-11-15 MED ORDER — SIMVASTATIN 40 MG PO TABS: 40.0000 mg | ORAL_TABLET | Freq: Every day | ORAL | 1 refills | Status: DC

## 2016-11-15 MED ORDER — METFORMIN HCL 500 MG PO TABS: 500.0000 mg | ORAL_TABLET | Freq: Two times a day (BID) | ORAL | 1 refills | Status: DC

## 2016-11-29 ENCOUNTER — Other Ambulatory Visit: Payer: Self-pay | Admitting: Internal Medicine

## 2016-12-29 ENCOUNTER — Telehealth: Payer: Self-pay | Admitting: Internal Medicine

## 2016-12-29 NOTE — Telephone Encounter (Signed)
Left pt message asking to call Allison back directly at [phone] to schedule AWV. Thanks! °

## 2017-01-02 ENCOUNTER — Other Ambulatory Visit: Payer: Self-pay | Admitting: Internal Medicine

## 2017-01-07 ENCOUNTER — Ambulatory Visit
Admission: RE | Admit: 2017-01-07 | Discharge: 2017-01-07 | Disposition: A | Discharge status: Home / Self Care | Payer: PPO | Source: Ambulatory Visit | Attending: Internal Medicine | Admitting: Internal Medicine

## 2017-01-07 DIAGNOSIS — E2839 Other primary ovarian failure: Secondary | ICD-10-CM | POA: Diagnosis not present

## 2017-01-07 DIAGNOSIS — Z1239 Encounter for other screening for malignant neoplasm of breast: Secondary | ICD-10-CM

## 2017-01-07 DIAGNOSIS — Z1231 Encounter for screening mammogram for malignant neoplasm of breast: Secondary | ICD-10-CM | POA: Diagnosis present

## 2017-01-07 DIAGNOSIS — Z13820 Encounter for screening for osteoporosis: Secondary | ICD-10-CM | POA: Diagnosis not present

## 2017-01-09 ENCOUNTER — Other Ambulatory Visit: Payer: Self-pay | Admitting: *Deleted

## 2017-01-09 ENCOUNTER — Inpatient Hospital Stay
Admission: RE | Admit: 2017-01-09 | Discharge: 2017-01-09 | Disposition: A | Discharge status: Home / Self Care | Payer: Self-pay | Source: Ambulatory Visit | Attending: *Deleted | Admitting: *Deleted

## 2017-01-09 DIAGNOSIS — Z9289 Personal history of other medical treatment: Secondary | ICD-10-CM

## 2017-01-10 ENCOUNTER — Encounter: Payer: Self-pay | Admitting: Internal Medicine

## 2017-01-24 NOTE — Telephone Encounter (Signed)
Mailed unread message to patient.  

## 2017-02-01 NOTE — Telephone Encounter (Signed)
11/07/16 appt cancelled due to [place] sick.  02/01/17 Left pt message asking to call Ebony Hail back directly at [phone] to schedule AWV. Thanks!  *NOTE* Last AWV 03/21/15

## 2017-02-05 ENCOUNTER — Other Ambulatory Visit: Payer: Self-pay | Admitting: Internal Medicine

## 2017-03-09 ENCOUNTER — Other Ambulatory Visit: Payer: Self-pay | Admitting: Internal Medicine

## 2017-03-25 NOTE — Telephone Encounter (Signed)
Error

## 2017-03-28 ENCOUNTER — Telehealth: Payer: Self-pay | Admitting: Internal Medicine

## 2017-03-28 NOTE — Telephone Encounter (Signed)
Left pt message asking to call Ebony Hail back directly at [phone] to schedule AWV. Thanks!  *NOTE* Last AWV 03/24/15; please schedule anytime

## 2017-04-09 ENCOUNTER — Other Ambulatory Visit: Payer: Self-pay | Admitting: Internal Medicine

## 2017-04-22 NOTE — Telephone Encounter (Signed)
Left pt message asking to call Ebony Hail back directly at [phone] to schedule AWV. Thanks!  *NOTE* Last AWV 03/24/15; please schedule anytime

## 2017-05-12 ENCOUNTER — Other Ambulatory Visit: Payer: Self-pay | Admitting: Internal Medicine

## 2017-05-13 NOTE — Telephone Encounter (Signed)
Refilled: 04/09/2017 Last OV: 11/07/2016 Next OV: not scheduled Last Lab: 11/07/2016

## 2017-06-10 ENCOUNTER — Other Ambulatory Visit: Payer: Self-pay | Admitting: Internal Medicine

## 2017-06-19 ENCOUNTER — Other Ambulatory Visit: Payer: Self-pay | Admitting: Internal Medicine

## 2017-07-29 ENCOUNTER — Telehealth: Payer: Self-pay | Admitting: Internal Medicine

## 2017-07-29 NOTE — Telephone Encounter (Signed)
Refilled: 06/19/2017 Last OV: 11/07/2016 Next OV: not scheduled Last TSH: 11/07/2016

## 2017-08-05 NOTE — Telephone Encounter (Signed)
Pt called back about this rx.  pharmacy states they are having to change manufactures. So they have not filled this prescription/   Need to hear back if that is ok with the dr for this change. Pt states she has been without this med for a week. Pt is at the pharmacy  declined to make a visit at this time because she is shopping

## 2017-08-06 ENCOUNTER — Telehealth: Payer: Self-pay | Admitting: Internal Medicine

## 2017-08-06 NOTE — Telephone Encounter (Signed)
See previous message

## 2017-08-06 NOTE — Telephone Encounter (Unsigned)
Copied from [place] [id number]. Topic: General - Other >> Aug 06, 2017 10:01 AM Neva Seat wrote: Pt very upset with Rx not being refilled since Feb 4th, 2019 Pt has been without the Rx for a week.   [place], [place] - [address] [place] [address] Phone: [phone] Fax: [phone] Not a 24 hour pharmacy; exact hours not known

## 2017-08-06 NOTE — Telephone Encounter (Signed)
Spoke with pharmacy to let them know that it was okay to go ahead and refill the medication with a different manufacture. Called the pt to let the pt know that we authorized the refill but that she would need to look for any changes after starting the levothyroxine from a different manufacture.

## 2017-08-06 NOTE — Telephone Encounter (Signed)
Please advise 

## 2017-09-10 ENCOUNTER — Other Ambulatory Visit: Payer: Self-pay | Admitting: Internal Medicine

## 2017-09-11 NOTE — Telephone Encounter (Signed)
Refilled: 08/08/2017 Last OV: 11/07/2016 Next OV: not scheduled Last TSH: 11/07/2016

## 2017-09-13 ENCOUNTER — Other Ambulatory Visit: Payer: Self-pay | Admitting: Internal Medicine

## 2017-10-10 ENCOUNTER — Encounter (INDEPENDENT_AMBULATORY_CARE_PROVIDER_SITE_OTHER): Payer: PPO | Admitting: Ophthalmology

## 2017-10-17 ENCOUNTER — Ambulatory Visit (INDEPENDENT_AMBULATORY_CARE_PROVIDER_SITE_OTHER): Payer: PPO | Admitting: Ophthalmology

## 2017-12-09 ENCOUNTER — Other Ambulatory Visit: Payer: Self-pay | Admitting: Internal Medicine

## 2017-12-09 DIAGNOSIS — E782 Mixed hyperlipidemia: Secondary | ICD-10-CM

## 2017-12-09 DIAGNOSIS — E119 Type 2 diabetes mellitus without complications: Secondary | ICD-10-CM

## 2017-12-09 DIAGNOSIS — E1169 Type 2 diabetes mellitus with other specified complication: Secondary | ICD-10-CM

## 2017-12-09 DIAGNOSIS — E034 Atrophy of thyroid (acquired): Secondary | ICD-10-CM

## 2017-12-09 DIAGNOSIS — E669 Obesity, unspecified: Secondary | ICD-10-CM

## 2017-12-09 NOTE — Telephone Encounter (Signed)
Last OV: 11/07/2016 Next OV: not scheduled

## 2017-12-09 NOTE — Telephone Encounter (Signed)
Refilled    for 30 days only.  OFFICE VISIT NEEDED prior to any more refills.  Needs fasting labs prior to visit

## 2017-12-10 NOTE — Telephone Encounter (Signed)
Spoke with pt and scheduled her both a lab appt and a fasting lab appt. Pt is aware of appt date and time.

## 2017-12-24 DIAGNOSIS — R69 Illness, unspecified: Secondary | ICD-10-CM | POA: Diagnosis not present

## 2017-12-29 ENCOUNTER — Other Ambulatory Visit: Payer: Self-pay | Admitting: Internal Medicine

## 2018-01-02 DIAGNOSIS — R69 Illness, unspecified: Secondary | ICD-10-CM | POA: Diagnosis not present

## 2018-01-14 ENCOUNTER — Other Ambulatory Visit: Payer: Self-pay | Admitting: Internal Medicine

## 2018-01-14 DIAGNOSIS — E782 Mixed hyperlipidemia: Secondary | ICD-10-CM

## 2018-01-14 DIAGNOSIS — E034 Atrophy of thyroid (acquired): Secondary | ICD-10-CM

## 2018-01-17 ENCOUNTER — Other Ambulatory Visit: Payer: Self-pay | Admitting: Internal Medicine

## 2018-01-17 DIAGNOSIS — E119 Type 2 diabetes mellitus without complications: Secondary | ICD-10-CM

## 2018-01-17 DIAGNOSIS — E034 Atrophy of thyroid (acquired): Secondary | ICD-10-CM

## 2018-01-17 DIAGNOSIS — E669 Obesity, unspecified: Principal | ICD-10-CM

## 2018-01-17 DIAGNOSIS — E1169 Type 2 diabetes mellitus with other specified complication: Secondary | ICD-10-CM

## 2018-01-20 ENCOUNTER — Other Ambulatory Visit (INDEPENDENT_AMBULATORY_CARE_PROVIDER_SITE_OTHER): Payer: PPO

## 2018-01-20 DIAGNOSIS — E1169 Type 2 diabetes mellitus with other specified complication: Secondary | ICD-10-CM

## 2018-01-20 DIAGNOSIS — E119 Type 2 diabetes mellitus without complications: Secondary | ICD-10-CM

## 2018-01-20 DIAGNOSIS — E669 Obesity, unspecified: Secondary | ICD-10-CM

## 2018-01-20 DIAGNOSIS — E782 Mixed hyperlipidemia: Secondary | ICD-10-CM

## 2018-01-20 DIAGNOSIS — E034 Atrophy of thyroid (acquired): Secondary | ICD-10-CM | POA: Diagnosis not present

## 2018-01-20 LAB — MICROALBUMIN / CREATININE URINE RATIO
CREATININE, U: 145.5 mg/dL
MICROALB UR: 4.8 mg/dL — AB (ref 0.0–1.9)
Microalb Creat Ratio: 3.3 mg/g (ref 0.0–30.0)

## 2018-01-20 LAB — LIPID PANEL
Cholesterol: 165 mg/dL (ref 0–200)
HDL: 38.6 mg/dL — ABNORMAL LOW
LDL Cholesterol: 97 mg/dL (ref 0–99)
NonHDL: 126.71
Total CHOL/HDL Ratio: 4
Triglycerides: 148 mg/dL (ref 0.0–149.0)
VLDL: 29.6 mg/dL (ref 0.0–40.0)

## 2018-01-20 LAB — COMPREHENSIVE METABOLIC PANEL WITH GFR
ALT: 31 U/L (ref 0–35)
AST: 33 U/L (ref 0–37)
Albumin: 4.4 g/dL (ref 3.5–5.2)
Alkaline Phosphatase: 37 U/L — ABNORMAL LOW (ref 39–117)
BUN: 34 mg/dL — ABNORMAL HIGH (ref 6–23)
CO2: 22 meq/L (ref 19–32)
Calcium: 10.3 mg/dL (ref 8.4–10.5)
Chloride: 104 meq/L (ref 96–112)
Creatinine, Ser: 1.32 mg/dL — ABNORMAL HIGH (ref 0.40–1.20)
GFR: 41.58 mL/min — ABNORMAL LOW
Glucose, Bld: 148 mg/dL — ABNORMAL HIGH (ref 70–99)
Potassium: 4.8 meq/L (ref 3.5–5.1)
Sodium: 140 meq/L (ref 135–145)
Total Bilirubin: 0.3 mg/dL (ref 0.2–1.2)
Total Protein: 8.1 g/dL (ref 6.0–8.3)

## 2018-01-20 LAB — TSH: TSH: 2.97 u[IU]/mL (ref 0.35–4.50)

## 2018-01-20 LAB — HEMOGLOBIN A1C: Hgb A1c MFr Bld: 7.4 % — ABNORMAL HIGH (ref 4.6–6.5)

## 2018-01-22 ENCOUNTER — Ambulatory Visit (INDEPENDENT_AMBULATORY_CARE_PROVIDER_SITE_OTHER): Payer: PPO | Admitting: Internal Medicine

## 2018-01-22 ENCOUNTER — Encounter: Payer: Self-pay | Admitting: Internal Medicine

## 2018-01-22 VITALS — BP 140/80 | HR 89 | Temp 98.1°F | Resp 16 | Ht 65.0 in | Wt 203.0 lb

## 2018-01-22 DIAGNOSIS — E1169 Type 2 diabetes mellitus with other specified complication: Secondary | ICD-10-CM

## 2018-01-22 DIAGNOSIS — Z0001 Encounter for general adult medical examination with abnormal findings: Secondary | ICD-10-CM | POA: Diagnosis not present

## 2018-01-22 DIAGNOSIS — Z1239 Encounter for other screening for malignant neoplasm of breast: Secondary | ICD-10-CM

## 2018-01-22 DIAGNOSIS — E034 Atrophy of thyroid (acquired): Secondary | ICD-10-CM | POA: Diagnosis not present

## 2018-01-22 DIAGNOSIS — I10 Essential (primary) hypertension: Secondary | ICD-10-CM | POA: Diagnosis not present

## 2018-01-22 DIAGNOSIS — R944 Abnormal results of kidney function studies: Secondary | ICD-10-CM

## 2018-01-22 DIAGNOSIS — E119 Type 2 diabetes mellitus without complications: Secondary | ICD-10-CM

## 2018-01-22 DIAGNOSIS — E669 Obesity, unspecified: Secondary | ICD-10-CM

## 2018-01-22 DIAGNOSIS — Z1231 Encounter for screening mammogram for malignant neoplasm of breast: Secondary | ICD-10-CM

## 2018-01-22 DIAGNOSIS — F4321 Adjustment disorder with depressed mood: Secondary | ICD-10-CM | POA: Diagnosis not present

## 2018-01-22 DIAGNOSIS — F4329 Adjustment disorder with other symptoms: Secondary | ICD-10-CM

## 2018-01-22 DIAGNOSIS — K760 Fatty (change of) liver, not elsewhere classified: Secondary | ICD-10-CM

## 2018-01-22 DIAGNOSIS — Z0000 Encounter for general adult medical examination without abnormal findings: Secondary | ICD-10-CM

## 2018-01-22 DIAGNOSIS — F4381 Prolonged grief disorder: Secondary | ICD-10-CM

## 2018-01-22 MED ORDER — LEVOTHYROXINE SODIUM 100 MCG PO TABS: 100.0000 ug | ORAL_TABLET | Freq: Every day | ORAL | 0 refills | Status: DC

## 2018-01-22 MED ORDER — METFORMIN HCL 850 MG PO TABS: 850.0000 mg | ORAL_TABLET | Freq: Two times a day (BID) | ORAL | 0 refills | Status: DC

## 2018-01-22 NOTE — Progress Notes (Signed)
Patient ID: Marissa Rodriguez, female    DOB: 1940/06/27  Age: 77 y.o. MRN: [id number]  The patient is here for annual preventive examination and management of other chronic and acute problems including type 2 DM .   Last seen May 2018   The risk factors are reflected in the social history.  The roster of all physicians providing medical care to patient - is listed in the Snapshot section of the chart.  Activities of daily living:  The patient is 100% independent in all ADLs: dressing, toileting, feeding as well as independent mobility  Home safety : The patient has smoke detectors in the home. They wear seatbelts.  There are no firearms at home. There is no violence in the home.   There is no risks for hepatitis, STDs or HIV. There is no   history of blood transfusion. They have no travel history to infectious disease endemic areas of the world.  The patient has seen their dentist in the last six month. They have seen their eye doctor in the last year. They admit to slight hearing difficulty with regard to whispered voices and some television programs.  They have deferred audiologic testing in the last year.  They do not  have excessive sun exposure. Discussed the need for sun protection: hats, long sleeves and use of sunscreen if there is significant sun exposure.   Diet: the importance of a healthy diet is discussed. They do have a healthy diet.  The benefits of regular aerobic exercise were discussed. She walks 4 times per week ,  20 minutes.   Depression screen: there are no signs or vegative symptoms of depression- irritability, change in appetite, anhedonia, sadness/tearfullness.However, she is experiencing grief and loneliness following the loss of her mother at the age of 62 two months ago, as well as the passing of her husband in April 2018. She has 2 very supportive sons who liver nearby and she also has constant support from her church family   Cognitive assessment: the patient manages  all their financial and personal affairs and is actively engaged. They could relate day,date,year and events; recalled 2/3 objects at 3 minutes; performed clock-face test normally.  The following portions of the patient's history were reviewed and updated as appropriate: allergies, current medications, past family history, past medical history,  past surgical history, past social history  and problem list.  Visual acuity was not assessed per patient preference since she has regular follow up with her ophthalmologist. Hearing and body mass index were assessed and reviewed.   During the course of the visit the patient was educated and counseled about appropriate screening and preventive services including : fall prevention , diabetes screening, nutrition counseling, colorectal cancer screening, and recommended immunizations.    CC: The primary encounter diagnosis was Encounter for preventive health examination. Diagnoses of Decreased GFR, Hypothyroidism due to acquired atrophy of thyroid, Breast cancer screening, Essential hypertension, Diabetes mellitus type 2 in obese ([place]), Grief reaction with prolonged bereavement, and NAFLD (nonalcoholic fatty liver disease) were also pertinent to this visit.  h follow up on diabetes, hypertension and hyperlipidemia. .  Patient has no complaints today.  Patient is not  following a low glycemic index diet but she is taking all prescribed medications regularly without side effects.  She does not check sugars often , but Fasting sugars have been under less than 140 most of the time and post prandials have been under 160 except on rare occasions. Patient is not exercising or  intentionally trying to lose weight but has lost 5 lbs due to diminished appetite. .  Patient has had an eye exam in the last 12 months and checks feet regularly for signs of infection.  Patient does not walk barefoot outside,  And denies ayn numbness tingling or burning in feet. Patient is up to date  on all recommended vaccinations    History Marissa Rodriguez has a past medical history of History of MRI of brain and brain stem (May 2012), Hyperlipidemia, Hypertension, Hypothyroidism, and Tubular adenoma of colon (Nov 2010).   She has a past surgical history that includes Abdominal hysterectomy (1975) and Appendectomy (1975).   Her family history includes Breast cancer (age of onset: 30) in her sister; Cancer in her father.She reports that she has never smoked. She has never used smokeless tobacco. She reports that she does not drink alcohol or use drugs.  Outpatient Medications Prior to Visit  Medication Sig Dispense Refill  . lisinopril (PRINIVIL,ZESTRIL) 40 MG tablet TAKE 1 TABLET BY MOUTH ONCE DAILY (OFFICE  VISIT  NEEDED  PRIOR  TO  ANY  MORE  REFILLS) 90 tablet 1  . simvastatin (ZOCOR) 40 MG tablet TAKE 1 TABLET BY MOUTH ONCE DAILY (OFFICE  VISIT  NEEDED  PRIOR  TO  ANY  MORE  REFILLS) 30 tablet 0  . levothyroxine (SYNTHROID, LEVOTHROID) 88 MCG tablet TAKE 1 TABLET BY MOUTH ONCE DAILY (OFFICE  VISIT  NEEDED  PRIOR  TO  ANY  MORE  REFILLS) 30 tablet 0  . metFORMIN (GLUCOPHAGE) 500 MG tablet TAKE 1 TABLET BY MOUTH TWICE DAILY WITH MEALS 180 tablet 0  . benzonatate (TESSALON) 200 MG capsule Take 1 capsule (200 mg total) by mouth 3 (three) times daily as needed for cough. (Patient not taking: Reported on 11/07/2016) 60 capsule 0  . ondansetron (ZOFRAN ODT) 4 MG disintegrating tablet Take 1 tablet (4 mg total) by mouth every 8 (eight) hours as needed for nausea or vomiting. (Patient not taking: Reported on 01/22/2018) 20 tablet 0   No facility-administered medications prior to visit.     Review of Systems   Patient denies headache, fevers, malaise, unintentional weight loss, skin rash, eye pain, sinus congestion and sinus pain, sore throat, dysphagia,  hemoptysis , cough, dyspnea, wheezing, chest pain, palpitations, orthopnea, edema, abdominal pain, nausea, melena, diarrhea, constipation, flank pain,  dysuria, hematuria, urinary  Frequency, nocturia, numbness, tingling, seizures,  Focal weakness, Loss of consciousness,  Tremor, insomnia, depression, anxiety, and suicidal ideation.      Objective:  BP 140/80 (BP Location: Left Arm, Patient Position: Sitting, Cuff Size: Large)   Pulse 89   Temp 98.1 F (36.7 C) (Oral)   Resp 16   Ht 5\' 5"  (1.651 m)   Wt 203 lb (92.1 kg)   SpO2 96%   BMI 33.78 kg/m   Physical Exam   General appearance: alert, cooperative and appears stated age Head: Normocephalic, without obvious abnormality, atraumatic Eyes: conjunctivae/corneas clear. PERRL, EOM's intact. Fundi benign. Ears: normal TM's and external ear canals both ears Nose: Nares normal. Septum midline. Mucosa normal. No drainage or sinus tenderness. Throat: lips, mucosa, and tongue normal; teeth and gums normal Neck: no adenopathy, no carotid bruit, no JVD, supple, symmetrical, trachea midline and thyroid not enlarged, symmetric, no tenderness/mass/nodules Lungs: clear to auscultation bilaterally Breasts: normal appearance, no masses or tenderness Heart: regular rate and rhythm, S1, S2 normal, no murmur, click, rub or gallop Abdomen: soft, non-tender; bowel sounds normal; no masses,  no organomegaly Extremities:  extremities normal, atraumatic, no cyanosis or edema Pulses: 2+ and symmetric Skin: Skin color, texture, turgor normal. No rashes or lesions Neurologic: Alert and oriented X 3, normal strength and tone. Normal symmetric reflexes. Normal coordination and gait.      Assessment & Plan:   Problem List Items Addressed This Visit    Hypertension    Based on new guidelines she is not well controlled on current regimen.  Will have her check BP at home and adjust meds if majority are > 130/80.   Renal function  Has decreased.  But she was fasting   Will repeat in 2 weeks with BP check.  Lab Results  Component Value Date   CREATININE 1.32 (H) 01/20/2018   Lab Results  Component Value  Date   NA 140 01/20/2018   K 4.8 01/20/2018   CL 104 01/20/2018   CO2 22 01/20/2018         Hypothyroidism    She feels increased fatigue and would like to try a higher dose.  Will increase  to 100 mcg daily and recheck in 6 weeks   Lab Results  Component Value Date   TSH 2.97 01/20/2018         Relevant Medications   levothyroxine (SYNTHROID, LEVOTHROID) 100 MCG tablet   Other Relevant Orders   TSH   Encounter for preventive health examination - Primary    Annual comprehensive preventive exam was done as well as an evaluation and management of chronic conditions .  During the course of the visit the patient was educated and counseled about appropriate screening and preventive services including :  diabetes screening, lipid analysis with projected  10 year  risk for CAD , nutrition counseling, breast, cervical and colorectal cancer screening, and recommended immunizations.  Printed recommendations for health maintenance screenings was given      NAFLD (nonalcoholic fatty liver disease)    Presumed by ultrasound changes and serologies negative for autoimmune causes of hepatitis. Esophageal varices seen on prior CT .   Current liver enzymes are normalized  and all modifiable risk factors including obesity, diabetes and hyperlipidemia have been addressed .  She has been advised to have hepatitis A/B vaccines done and referral to Liver Clinic.    Lab Results  Component Value Date   ALT 31 01/20/2018   AST 33 01/20/2018   ALKPHOS 37 (L) 01/20/2018   BILITOT 0.3 01/20/2018          Relevant Orders   Ambulatory referral to Gastroenterology   Diabetes mellitus type 2 in obese ([place])    a1c has risen  Advised to increase metformin  dose to 850 mg bid.    Patient is tolerating statin therapy for CAD risk reduction and ACE I for management of  microscopic proteinuria. Lab Results  Component Value Date   HGBA1C 7.4 (H) 01/20/2018   Lab Results  Component Value Date   MICROALBUR  4.8 (H) 01/20/2018             Relevant Medications   metFORMIN (GLUCOPHAGE) 850 MG tablet   Grief reaction with prolonged bereavement    Patient is still dealing with the unexpected loss of spouse and  More recently,  Her mother.  She has adequate coping skills and emotional support . She has deferred grief counselling       Other Visit Diagnoses    Decreased GFR       Relevant Orders   Basic metabolic panel   Breast cancer screening  Relevant Orders   MM 3D SCREEN BREAST BILATERAL      I have discontinued [employer]. Winning's ondansetron and benzonatate. I have also changed her metFORMIN and levothyroxine. Additionally, I am having her maintain her simvastatin and lisinopril.  Meds ordered this encounter  Medications  . metFORMIN (GLUCOPHAGE) 850 MG tablet    Sig: Take 1 tablet (850 mg total) by mouth 2 (two) times daily with a meal.    Dispense:  60 tablet    Refill:  0  . levothyroxine (SYNTHROID, LEVOTHROID) 100 MCG tablet    Sig: Take 1 tablet (100 mcg total) by mouth daily before breakfast. KEEP ON FILE FO R NEXT REFILL    Dispense:  90 tablet    Refill:  0    Please consider 90 day supplies to promote better adherence    Medications Discontinued During This Encounter  Medication Reason  . benzonatate (TESSALON) 200 MG capsule Patient has not taken in last 30 days  . ondansetron (ZOFRAN ODT) 4 MG disintegrating tablet Patient has not taken in last 30 days  . metFORMIN (GLUCOPHAGE) 500 MG tablet Reorder  . levothyroxine (SYNTHROID, LEVOTHROID) 88 MCG tablet     Follow-up: No follow-ups on file.   Crecencio Mc, MD

## 2018-01-22 NOTE — Patient Instructions (Addendum)
increase your thyroid 88 mcg dose to 2 tablets on Sundays,  Until your current bottle is gone.  The new  Refill will be for 100 mcg and you will take one daily    Increase  metformin to 850 mg twice daily   Return in 6 weeks for non fasting labs (make sure you are well hydrated)  Your mammogram has been ordered.  You can call to make your appointment at [facility]   Health Maintenance for Postmenopausal Women Menopause is a normal process in which your reproductive ability comes to an end. This process happens gradually over a span of months to years, usually between the ages of 57 and 69. Menopause is complete when you have missed 12 consecutive menstrual periods. It is important to talk with your health care provider about some of the most common conditions that affect postmenopausal women, such as heart disease, cancer, and bone loss (osteoporosis). Adopting a healthy lifestyle and getting preventive care can help to promote your health and wellness. Those actions can also lower your chances of developing some of these common conditions. What should I know about menopause? During menopause, you may experience a number of symptoms, such as:  Moderate-to-severe hot flashes.  Night sweats.  Decrease in sex drive.  Mood swings.  Headaches.  Tiredness.  Irritability.  Memory problems.  Insomnia.  Choosing to treat or not to treat menopausal changes is an individual decision that you make with your health care provider. What should I know about hormone replacement therapy and supplements? Hormone therapy products are effective for treating symptoms that are associated with menopause, such as hot flashes and night sweats. Hormone replacement carries certain risks, especially as you become older. If you are thinking about using estrogen or estrogen with progestin treatments, discuss the benefits and risks with your health care provider. What should I know about heart  disease and stroke? Heart disease, heart attack, and stroke become more likely as you age. This may be due, in part, to the hormonal changes that your body experiences during menopause. These can affect how your body processes dietary fats, triglycerides, and cholesterol. Heart attack and stroke are both medical emergencies. There are many things that you can do to help prevent heart disease and stroke:  Have your blood pressure checked at least every 1-2 years. High blood pressure causes heart disease and increases the risk of stroke.  If you are 48-18 years old, ask your health care provider if you should take aspirin to prevent a heart attack or a stroke.  Do not use any tobacco products, including cigarettes, chewing tobacco, or electronic cigarettes. If you need help quitting, ask your health care provider.  It is important to eat a healthy diet and maintain a healthy weight. ? Be sure to include plenty of vegetables, fruits, low-fat dairy products, and lean protein. ? Avoid eating foods that are high in solid fats, added sugars, or salt (sodium).  Get regular exercise. This is one of the most important things that you can do for your health. ? Try to exercise for at least 150 minutes each week. The type of exercise that you do should increase your heart rate and make you sweat. This is known as moderate-intensity exercise. ? Try to do strengthening exercises at least twice each week. Do these in addition to the moderate-intensity exercise.  Know your numbers.Ask your health care provider to check your cholesterol and your blood glucose. Continue to have your blood tested  as directed by your health care provider.  What should I know about cancer screening? There are several types of cancer. Take the following steps to reduce your risk and to catch any cancer development as early as possible. Breast Cancer  Practice breast self-awareness. ? This means understanding how your breasts  normally appear and feel. ? It also means doing regular breast self-exams. Let your health care provider know about any changes, no matter how small.  If you are 91 or older, have a clinician do a breast exam (clinical breast exam or CBE) every year. Depending on your age, family history, and medical history, it may be recommended that you also have a yearly breast X-ray (mammogram).  If you have a family history of breast cancer, talk with your health care provider about genetic screening.  If you are at high risk for breast cancer, talk with your health care provider about having an MRI and a mammogram every year.  Breast cancer (BRCA) gene test is recommended for women who have family members with BRCA-related cancers. Results of the assessment will determine the need for genetic counseling and BRCA1 and for BRCA2 testing. BRCA-related cancers include these types: ? Breast. This occurs in males or females. ? Ovarian. ? Tubal. This may also be called fallopian tube cancer. ? Cancer of the abdominal or pelvic lining (peritoneal cancer). ? Prostate. ? Pancreatic.  Cervical, Uterine, and Ovarian Cancer Your health care provider may recommend that you be screened regularly for cancer of the pelvic organs. These include your ovaries, uterus, and vagina. This screening involves a pelvic exam, which includes checking for microscopic changes to the surface of your cervix (Pap test).  For women ages 21-65, health care providers may recommend a pelvic exam and a Pap test every three years. For women ages 59-65, they may recommend the Pap test and pelvic exam, combined with testing for human papilloma virus (HPV), every five years. Some types of HPV increase your risk of cervical cancer. Testing for HPV may also be done on women of any age who have unclear Pap test results.  Other health care providers may not recommend any screening for nonpregnant women who are considered low risk for pelvic cancer  and have no symptoms. Ask your health care provider if a screening pelvic exam is right for you.  If you have had past treatment for cervical cancer or a condition that could lead to cancer, you need Pap tests and screening for cancer for at least 20 years after your treatment. If Pap tests have been discontinued for you, your risk factors (such as having a new sexual partner) need to be reassessed to determine if you should start having screenings again. Some women have medical problems that increase the chance of getting cervical cancer. In these cases, your health care provider may recommend that you have screening and Pap tests more often.  If you have a family history of uterine cancer or ovarian cancer, talk with your health care provider about genetic screening.  If you have vaginal bleeding after reaching menopause, tell your health care provider.  There are currently no reliable tests available to screen for ovarian cancer.  Lung Cancer Lung cancer screening is recommended for adults 95-57 years old who are at high risk for lung cancer because of a history of smoking. A yearly low-dose CT scan of the lungs is recommended if you:  Currently smoke.  Have a history of at least 30 pack-years of smoking and you  currently smoke or have quit within the past 15 years. A pack-year is smoking an average of one pack of cigarettes per day for one year.  Yearly screening should:  Continue until it has been 15 years since you quit.  Stop if you develop a health problem that would prevent you from having lung cancer treatment.  Colorectal Cancer  This type of cancer can be detected and can often be prevented.  Routine colorectal cancer screening usually begins at age 3 and continues through age 12.  If you have risk factors for colon cancer, your health care provider may recommend that you be screened at an earlier age.  If you have a family history of colorectal cancer, talk with your  health care provider about genetic screening.  Your health care provider may also recommend using home test kits to check for hidden blood in your stool.  A small camera at the end of a tube can be used to examine your colon directly (sigmoidoscopy or colonoscopy). This is done to check for the earliest forms of colorectal cancer.  Direct examination of the colon should be repeated every 5-10 years until age 52. However, if early forms of precancerous polyps or small growths are found or if you have a family history or genetic risk for colorectal cancer, you may need to be screened more often.  Skin Cancer  Check your skin from head to toe regularly.  Monitor any moles. Be sure to tell your health care provider: ? About any new moles or changes in moles, especially if there is a change in a mole's shape or color. ? If you have a mole that is larger than the size of a pencil eraser.  If any of your family members has a history of skin cancer, especially at a young age, talk with your health care provider about genetic screening.  Always use sunscreen. Apply sunscreen liberally and repeatedly throughout the day.  Whenever you are outside, protect yourself by wearing long sleeves, pants, a wide-brimmed hat, and sunglasses.  What should I know about osteoporosis? Osteoporosis is a condition in which bone destruction happens more quickly than new bone creation. After menopause, you may be at an increased risk for osteoporosis. To help prevent osteoporosis or the bone fractures that can happen because of osteoporosis, the following is recommended:  If you are 27-64 years old, get at least 1,000 mg of calcium and at least 600 mg of vitamin D per day.  If you are older than age 60 but younger than age 51, get at least 1,200 mg of calcium and at least 600 mg of vitamin D per day.  If you are older than age 60, get at least 1,200 mg of calcium and at least 800 mg of vitamin D per day.  Smoking  and excessive alcohol intake increase the risk of osteoporosis. Eat foods that are rich in calcium and vitamin D, and do weight-bearing exercises several times each week as directed by your health care provider. What should I know about how menopause affects my mental health? Depression may occur at any age, but it is more common as you become older. Common symptoms of depression include:  Low or sad mood.  Changes in sleep patterns.  Changes in appetite or eating patterns.  Feeling an overall lack of motivation or enjoyment of activities that you previously enjoyed.  Frequent crying spells.  Talk with your health care provider if you think that you are experiencing depression. What  should I know about immunizations? It is important that you get and maintain your immunizations. These include:  Tetanus, diphtheria, and pertussis (Tdap) booster vaccine.  Influenza every year before the flu season begins.  Pneumonia vaccine.  Shingles vaccine.  Your health care provider may also recommend other immunizations. This information is not intended to replace advice given to you by your health care provider. Make sure you discuss any questions you have with your health care provider. Document Released: 08/03/2005 Document Revised: 12/30/2015 Document Reviewed: 03/15/2015 Elsevier Interactive Patient Education  2018 [employer].

## 2018-01-24 ENCOUNTER — Telehealth: Payer: Self-pay | Admitting: Internal Medicine

## 2018-01-24 DIAGNOSIS — F4321 Adjustment disorder with depressed mood: Secondary | ICD-10-CM | POA: Insufficient documentation

## 2018-01-24 DIAGNOSIS — F4381 Prolonged grief disorder: Secondary | ICD-10-CM | POA: Insufficient documentation

## 2018-01-24 DIAGNOSIS — F4329 Adjustment disorder with other symptoms: Secondary | ICD-10-CM | POA: Insufficient documentation

## 2018-01-24 NOTE — Assessment & Plan Note (Addendum)
Presumed by ultrasound changes and serologies negative for autoimmune causes of hepatitis. Esophageal varices seen on prior CT .   Current liver enzymes are normalized  and all modifiable risk factors including obesity, diabetes and hyperlipidemia have been addressed .  She has been advised to have hepatitis A/B vaccines done and referral to Liver Clinic.    Lab Results  Component Value Date   ALT 31 01/20/2018   AST 33 01/20/2018   ALKPHOS 37 (L) 01/20/2018   BILITOT 0.3 01/20/2018

## 2018-01-24 NOTE — Assessment & Plan Note (Addendum)
a1c has risen  Advised to increase metformin  dose to 850 mg bid.    Patient is tolerating statin therapy for CAD risk reduction and ACE I for management of  microscopic proteinuria. Lab Results  Component Value Date   HGBA1C 7.4 (H) 01/20/2018   Lab Results  Component Value Date   MICROALBUR 4.8 (H) 01/20/2018

## 2018-01-24 NOTE — Assessment & Plan Note (Signed)
She feels increased fatigue and would like to try a higher dose.  Will increase  to 100 mcg daily and recheck in 6 weeks   Lab Results  Component Value Date   TSH 2.97 01/20/2018

## 2018-01-24 NOTE — Assessment & Plan Note (Signed)
Based on new guidelines she is not well controlled on current regimen.  Will have her check BP at home and adjust meds if majority are > 130/80.   Renal function  Has decreased.  But she was fasting   Will repeat in 2 weeks with BP check.  Lab Results  Component Value Date   CREATININE 1.32 (H) 01/20/2018   Lab Results  Component Value Date   NA 140 01/20/2018   K 4.8 01/20/2018   CL 104 01/20/2018   CO2 22 01/20/2018

## 2018-01-24 NOTE — Assessment & Plan Note (Signed)
Annual comprehensive preventive exam was done as well as an evaluation and management of chronic conditions .  During the course of the visit the patient was educated and counseled about appropriate screening and preventive services including :  diabetes screening, lipid analysis with projected  10 year  risk for CAD , nutrition counseling, breast, cervical and colorectal cancer screening, and recommended immunizations.  Printed recommendations for health maintenance screenings was given 

## 2018-01-24 NOTE — Assessment & Plan Note (Signed)
Patient is still dealing with the unexpected loss of spouse and  More recently,  Her mother.  She has adequate coping skills and emotional support . She has deferred grief counselling

## 2018-02-04 ENCOUNTER — Telehealth: Payer: Self-pay

## 2018-02-04 NOTE — Telephone Encounter (Signed)
Copied from [facility] [id number]. Topic: Referral - Status >> Feb 04, 2018  4:25 PM Reyne Dumas L wrote: Reason for CRM:  Pt calling to check on the status of her referral to a gastroenterologist for fatty liver.

## 2018-02-05 NOTE — Telephone Encounter (Signed)
Pulled her referral to [facility]-it states: 01/24/2018 3:49 PM EDT General Called patient to schedule. She stated she didn't know anything about this and will call us back after she talks to Dr. Derrel Nip. Tia Masker E  Note Text:  Called patient to schedule. She stated she didn't know anything about this and will call us back after she talks to Dr. Derrel Nip.     I will call her to let her know to give them a call back since that is what the message states.

## 2018-02-12 ENCOUNTER — Ambulatory Visit
Admission: RE | Admit: 2018-02-12 | Discharge: 2018-02-12 | Disposition: A | Discharge status: Home / Self Care | Payer: PPO | Source: Ambulatory Visit | Attending: Internal Medicine | Admitting: Internal Medicine

## 2018-02-12 ENCOUNTER — Other Ambulatory Visit: Payer: Self-pay | Admitting: Internal Medicine

## 2018-02-12 DIAGNOSIS — E034 Atrophy of thyroid (acquired): Secondary | ICD-10-CM

## 2018-02-12 DIAGNOSIS — Z1231 Encounter for screening mammogram for malignant neoplasm of breast: Secondary | ICD-10-CM | POA: Diagnosis present

## 2018-02-12 DIAGNOSIS — Z1239 Encounter for other screening for malignant neoplasm of breast: Secondary | ICD-10-CM

## 2018-02-17 ENCOUNTER — Other Ambulatory Visit: Payer: Self-pay | Admitting: Internal Medicine

## 2018-02-17 DIAGNOSIS — E782 Mixed hyperlipidemia: Secondary | ICD-10-CM

## 2018-02-20 ENCOUNTER — Telehealth: Payer: Self-pay | Admitting: Internal Medicine

## 2018-02-20 NOTE — Telephone Encounter (Signed)
While speaking to pt this morning to schedule AWV, pt mentioned that she has been waiting on the Simvastatin refill. She has been to [facility] x2 and the medication is not there. Please advise

## 2018-02-20 NOTE — Telephone Encounter (Signed)
Left message to let pt know that medication was sent to [facility] yesterday.

## 2018-02-24 ENCOUNTER — Other Ambulatory Visit: Payer: Self-pay | Admitting: Internal Medicine

## 2018-03-05 ENCOUNTER — Ambulatory Visit (INDEPENDENT_AMBULATORY_CARE_PROVIDER_SITE_OTHER): Payer: PPO

## 2018-03-05 ENCOUNTER — Other Ambulatory Visit: Payer: PPO

## 2018-03-05 VITALS — BP 136/74 | HR 81 | Temp 98.8°F | Resp 16 | Ht 64.5 in | Wt 204.1 lb

## 2018-03-05 DIAGNOSIS — Z23 Encounter for immunization: Secondary | ICD-10-CM | POA: Diagnosis not present

## 2018-03-05 DIAGNOSIS — E034 Atrophy of thyroid (acquired): Secondary | ICD-10-CM | POA: Diagnosis not present

## 2018-03-05 DIAGNOSIS — Z0000 Encounter for general adult medical examination without abnormal findings: Secondary | ICD-10-CM | POA: Diagnosis not present

## 2018-03-05 DIAGNOSIS — R944 Abnormal results of kidney function studies: Secondary | ICD-10-CM | POA: Diagnosis not present

## 2018-03-05 LAB — TSH: TSH: 1.3 u[IU]/mL (ref 0.35–4.50)

## 2018-03-05 LAB — BASIC METABOLIC PANEL WITH GFR
BUN: 20 mg/dL (ref 6–23)
CO2: 25 meq/L (ref 19–32)
Calcium: 9.6 mg/dL (ref 8.4–10.5)
Chloride: 100 meq/L (ref 96–112)
Creatinine, Ser: 1.19 mg/dL (ref 0.40–1.20)
GFR: 46.85 mL/min — ABNORMAL LOW
Glucose, Bld: 133 mg/dL — ABNORMAL HIGH (ref 70–99)
Potassium: 4.5 meq/L (ref 3.5–5.1)
Sodium: 135 meq/L (ref 135–145)

## 2018-03-05 NOTE — Progress Notes (Signed)
Subjective:   Marissa Rodriguez is a 77 y.o. female who presents for an Subsequent Medicare Annual Wellness Visit.  Review of Systems    No ROS.  Medicare Wellness Visit. Additional risk factors are reflected in the social history.  Cardiac Risk Factors include: advanced age (>9men, >71 women);obesity (BMI >30kg/m2);diabetes mellitus     Objective:    Today's Vitals   03/05/18 1000  BP: 136/74  Pulse: 81  Resp: 16  Temp: 98.8 F (37.1 C)  TempSrc: Oral  SpO2: 96%  Weight: 204 lb 1.9 oz (92.6 kg)  Height: 5' 4.5" (1.638 m)   Body mass index is 34.5 kg/m.  Advanced Directives 03/05/2018 07/22/2015  Does Patient Have a Medical Advance Directive? No No  Would patient like information on creating a medical advance directive? Yes (MAU/Ambulatory/Procedural Areas - Information given) No - patient declined information    Current Medications (verified) Outpatient Encounter Medications as of 03/05/2018  Medication Sig  . levothyroxine (SYNTHROID, LEVOTHROID) 100 MCG tablet Take 1 tablet (100 mcg total) by mouth daily before breakfast. KEEP ON FILE FO R NEXT REFILL  . levothyroxine (SYNTHROID, LEVOTHROID) 88 MCG tablet TAKE 1 TABLET BY MOUTH ONCE DAILY (OFFICE  VISIT  NEEDED  PRIOR  TO  ANY  MORE  REFILLS)  . lisinopril (PRINIVIL,ZESTRIL) 40 MG tablet TAKE 1 TABLET BY MOUTH ONCE DAILY (OFFICE  VISIT  NEEDED  PRIOR  TO  ANY  MORE  REFILLS)  . metFORMIN (GLUCOPHAGE) 500 MG tablet Take 500 mg by mouth 2 (two) times daily with a meal.  . simvastatin (ZOCOR) 40 MG tablet TAKE 1 TABLET BY MOUTH ONCE DAILY  . [DISCONTINUED] metFORMIN (GLUCOPHAGE) 850 MG tablet TAKE 1 TABLET BY MOUTH TWICE DAILY WITH A MEAL   No facility-administered encounter medications on file as of 03/05/2018.     Allergies (verified) Patient has no known allergies.   History: Past Medical History:  Diagnosis Date  . History of MRI of brain and brain stem May 2012   normal MRI/MRA done to rule out CVA  .  Hyperlipidemia   . Hypertension   . Hypothyroidism   . Tubular adenoma of colon Nov 2010   repeat due 2015   Past Surgical History:  Procedure Laterality Date  . ABDOMINAL HYSTERECTOMY  1975   TAH/LSO, wedge resection of right  . APPENDECTOMY  1975   incidental, done during TAH   Family History  Problem Relation Age of Onset  . Cancer Father        Lung   . Breast cancer Sister 49   Social History   Socioeconomic History  . Marital status: Widowed    Spouse name: Not on file  . Number of children: Not on file  . Years of education: Not on file  . Highest education level: Not on file  Occupational History  . Not on file  Social Needs  . Financial resource strain: Not hard at all  . Food insecurity:    Worry: Never true    Inability: Never true  . Transportation needs:    Medical: No    Non-medical: No  Tobacco Use  . Smoking status: Never Smoker  . Smokeless tobacco: Never Used  Substance and Sexual Activity  . Alcohol use: No  . Drug use: No  . Sexual activity: Not on file  Lifestyle  . Physical activity:    Days per week: Not on file    Minutes per session: Not on file  . Stress:  Not on file  Relationships  . Social connections:    Talks on phone: Not on file    Gets together: Not on file    Attends religious service: Not on file    Active member of club or organization: Not on file    Attends meetings of clubs or organizations: Not on file    Relationship status: Not on file  Other Topics Concern  . Not on file  Social History Narrative  . Not on file    Tobacco Counseling Counseling given: Not Answered   Clinical Intake:  Pre-visit preparation completed: Yes  Pain : No/denies pain     Nutritional Status: BMI > 30  Obese Diabetes: Yes(Followed by pcp)  How often do you need to have someone help you when you read instructions, pamphlets, or other written materials from your doctor or pharmacy?: 1 - Never  Interpreter Needed?: No       Activities of Daily Living In your present state of health, do you have any difficulty performing the following activities: 03/05/2018  Hearing? N  Vision? N  Difficulty concentrating or making decisions? N  Walking or climbing stairs? N  Dressing or bathing? N  Doing errands, shopping? N  Preparing Food and eating ? N  Using the Toilet? N  In the past six months, have you accidently leaked urine? N  Do you have problems with loss of bowel control? Y  Comment Managed with brief. Followed by pcp and GI.   Managing your Medications? N  Managing your Finances? N  Housekeeping or managing your Housekeeping? N  Some recent data might be hidden     Immunizations and Health Maintenance Immunization History  Administered Date(s) Administered  . Influenza, High Dose Seasonal PF 03/24/2015, 03/05/2018  . Influenza,inj,Quad PF,6+ Mos 07/01/2013  . Pneumococcal Conjugate-13 03/17/2014  . Pneumococcal Polysaccharide-23 07/02/2011  . Tdap 10/10/2012   Health Maintenance Due  Topic Date Due  . OPHTHALMOLOGY EXAM  10/17/2017  . INFLUENZA VACCINE  01/23/2018    Patient Care Team: Crecencio Mc, MD as PCP - General (Internal Medicine)  Indicate any recent Medical Services you may have received from other than [facility] providers in the past year (date may be approximate).     Assessment:   This is a routine wellness examination for [place].  The goal of the wellness visit is to assist the patient how to close the gaps in care and create a preventative care plan for the patient.   The roster of all physicians providing medical care to patient is listed in the Snapshot section of the chart.  Osteoporosis risk reviewed.    Safety issues reviewed; Smoke and carbon monoxide detectors in the home. No firearms in the home. Wears seatbelts when driving or riding with others. No violence in the home.  They do not have excessive sun exposure.  Discussed the need for sun protection: hats,  long sleeves and the use of sunscreen if there is significant sun exposure.  Patient is alert, normal appearance, oriented to person/place/and time.  Correctly identified the president of the [place] and recalls of 2/3 words. Performs simple calculations and can read correct time from watch face.  Displays appropriate judgement.  No new identified risk were noted.  No failures at ADL's or IADL's.    BMI- discussed the importance of a healthy diet, water intake and the benefits of aerobic exercise. Educational material provided.   24 hour diet recall: Regular diet  Dental- every 12 months.  Sleep patterns- Sleeps through the night without issues.   High dose influenza vaccine administered L deltoid, tolerated well. Educational material provided.  Labs completed today.   Patient Concerns: Persistent diarrhea with increased metformin. Encouraged to stay hydrated.  Deferred to pcp for follow up.  Glucometer given to monitor blood sugars per request.   Hearing/Vision screen Hearing Screening Comments: Patient is able to hear conversational tones without difficulty.  No issues reported.   Vision Screening Comments: Followed by Dr. Zigmund Daniel Wears corrective lenses No retinopathy reported Cataract extraction, bilateral Visual acuity not assessed per patient preference  Dietary issues and exercise activities discussed: Current Exercise Habits: The patient does not participate in regular exercise at present  Goals    . Increase physical activity     Aerobic exercise 150 minutes per week (5 days, 30 minutes)      Depression Screen PHQ 2/9 Scores 03/05/2018 01/22/2018 11/07/2016 03/24/2015 03/17/2014  PHQ - 2 Score 0 0 0 0 0  PHQ- 9 Score 0 1 1 - -    Fall Risk Fall Risk  03/05/2018 01/22/2018 11/07/2016 03/24/2015 03/17/2014  Falls in the past year? No No No No No   Cognitive Function: MMSE - Mini Mental State Exam 03/05/2018  Orientation to time 5  Orientation to Place 5   Registration 3  Recall 3  Language- repeat 1  Language- read & follow direction 1  Copy design 1        Screening Tests Health Maintenance  Topic Date Due  . OPHTHALMOLOGY EXAM  10/17/2017  . INFLUENZA VACCINE  01/23/2018  . HEMOGLOBIN A1C  07/23/2018  . FOOT EXAM  01/23/2019  . MAMMOGRAM  02/13/2019  . TETANUS/TDAP  10/11/2022  . DEXA SCAN  Completed  . PNA vac Low Risk Adult  Completed     Plan:   End of life planning; Advance aging; Advanced directives discussed. Copy of current HCPOA/Living Will requested upon completion.    I have personally reviewed and noted the following in the patient's chart:   . Medical and social history . Use of alcohol, tobacco or illicit drugs  . Current medications and supplements . Functional ability and status . Nutritional status . Physical activity . Advanced directives . List of other physicians . Hospitalizations, surgeries, and ER visits in previous 12 months . Vitals . Screenings to include cognitive, depression, and falls . Referrals and appointments  In addition, I have reviewed and discussed with patient certain preventive protocols, quality metrics, and best practice recommendations. A written personalized care plan for preventive services as well as general preventive health recommendations were provided to patient.     OBrien-Blaney, Dariusz Brase L, LPN   03/08/7828     I have reviewed the above information and agree with above.   Deborra Medina, MD

## 2018-03-05 NOTE — Patient Instructions (Addendum)
  Marissa Rodriguez , Thank you for taking time to come for your Medicare Wellness Visit. I appreciate your ongoing commitment to your health goals. Please review the following plan we discussed and let me know if I can assist you in the future.   These are the goals we discussed: Goals    . Increase physical activity     Aerobic exercise 150 minutes per week (5 days, 30 minutes)       This is a list of the screening recommended for you and due dates:  Health Maintenance  Topic Date Due  . Eye exam for diabetics  10/17/2017  . Flu Shot  01/23/2018  . Hemoglobin A1C  07/23/2018  . Complete foot exam   01/23/2019  . Mammogram  02/13/2019  . Tetanus Vaccine  10/11/2022  . DEXA scan (bone density measurement)  Completed  . Pneumonia vaccines  Completed

## 2018-03-07 ENCOUNTER — Other Ambulatory Visit: Payer: Self-pay | Admitting: Internal Medicine

## 2018-03-24 ENCOUNTER — Ambulatory Visit: Payer: PPO | Admitting: Gastroenterology

## 2018-04-01 ENCOUNTER — Ambulatory Visit: Payer: PPO

## 2018-06-13 ENCOUNTER — Other Ambulatory Visit: Payer: Self-pay | Admitting: Internal Medicine

## 2018-06-13 DIAGNOSIS — E034 Atrophy of thyroid (acquired): Secondary | ICD-10-CM

## 2018-07-26 ENCOUNTER — Other Ambulatory Visit: Payer: Self-pay | Admitting: Internal Medicine

## 2018-07-26 DIAGNOSIS — E669 Obesity, unspecified: Principal | ICD-10-CM

## 2018-07-26 DIAGNOSIS — E119 Type 2 diabetes mellitus without complications: Secondary | ICD-10-CM

## 2018-07-26 DIAGNOSIS — E1169 Type 2 diabetes mellitus with other specified complication: Secondary | ICD-10-CM

## 2018-08-21 ENCOUNTER — Other Ambulatory Visit: Payer: Self-pay | Admitting: Internal Medicine

## 2018-08-21 DIAGNOSIS — E669 Obesity, unspecified: Principal | ICD-10-CM

## 2018-08-21 DIAGNOSIS — E119 Type 2 diabetes mellitus without complications: Secondary | ICD-10-CM

## 2018-08-21 DIAGNOSIS — E782 Mixed hyperlipidemia: Secondary | ICD-10-CM

## 2018-08-21 DIAGNOSIS — E1169 Type 2 diabetes mellitus with other specified complication: Secondary | ICD-10-CM

## 2018-09-17 ENCOUNTER — Other Ambulatory Visit: Payer: Self-pay | Admitting: Internal Medicine

## 2018-09-17 DIAGNOSIS — E034 Atrophy of thyroid (acquired): Secondary | ICD-10-CM

## 2018-10-22 MED ORDER — GLUCOSE BLOOD VI STRP: ORAL_STRIP | 2 refills | Status: DC

## 2018-10-24 DIAGNOSIS — R69 Illness, unspecified: Secondary | ICD-10-CM | POA: Diagnosis not present

## 2018-10-29 ENCOUNTER — Other Ambulatory Visit: Payer: Self-pay | Admitting: Internal Medicine

## 2018-10-29 DIAGNOSIS — E034 Atrophy of thyroid (acquired): Secondary | ICD-10-CM

## 2018-10-29 DIAGNOSIS — E119 Type 2 diabetes mellitus without complications: Secondary | ICD-10-CM

## 2018-10-29 DIAGNOSIS — E782 Mixed hyperlipidemia: Secondary | ICD-10-CM

## 2018-10-29 DIAGNOSIS — E1169 Type 2 diabetes mellitus with other specified complication: Secondary | ICD-10-CM

## 2018-10-29 DIAGNOSIS — E669 Obesity, unspecified: Secondary | ICD-10-CM

## 2018-10-29 MED ORDER — TELMISARTAN 80 MG PO TABS: 80.0000 mg | ORAL_TABLET | Freq: Every day | ORAL | 0 refills | Status: DC

## 2018-10-29 NOTE — Telephone Encounter (Signed)
Refill request for lisinopril.

## 2018-10-29 NOTE — Telephone Encounter (Signed)
Please set patient up for 6 month follow up and fasting labs prior.  Lisinopril change made and mychart message sent, but in case she hasn't seen it :    I am making a decision to change your lisinopril to telmisartan, based on increased reports by one of my ENT colleagues of patients  developing tongue and throat swelling from lisinopril.  The condition , called "angioedema," can be fatal if a person's airway is compromised.  I also want you to take it at night instead of morning,  s recent studies have shown that taking your blood pressure medications at night protects you better from heart attacks and strokes.   Regards,   Deborra Medina, MD

## 2018-11-20 ENCOUNTER — Other Ambulatory Visit: Payer: Self-pay | Admitting: Internal Medicine

## 2018-11-20 DIAGNOSIS — E782 Mixed hyperlipidemia: Secondary | ICD-10-CM

## 2018-12-01 ENCOUNTER — Other Ambulatory Visit: Payer: Self-pay | Admitting: Internal Medicine

## 2018-12-04 ENCOUNTER — Other Ambulatory Visit: Payer: Self-pay | Admitting: Internal Medicine

## 2018-12-04 DIAGNOSIS — E034 Atrophy of thyroid (acquired): Secondary | ICD-10-CM

## 2018-12-04 DIAGNOSIS — E1169 Type 2 diabetes mellitus with other specified complication: Secondary | ICD-10-CM

## 2018-12-04 DIAGNOSIS — E119 Type 2 diabetes mellitus without complications: Secondary | ICD-10-CM

## 2018-12-04 DIAGNOSIS — K760 Fatty (change of) liver, not elsewhere classified: Secondary | ICD-10-CM

## 2018-12-05 NOTE — Telephone Encounter (Signed)
Looks like this medication was discontinued on 03/05/2018.

## 2018-12-06 NOTE — Telephone Encounter (Signed)
Refilled  for 30 days only.  OFFICE VISIT and labs  NEEDED prior to any more refills

## 2018-12-08 DIAGNOSIS — R69 Illness, unspecified: Secondary | ICD-10-CM | POA: Diagnosis not present

## 2018-12-10 DIAGNOSIS — R69 Illness, unspecified: Secondary | ICD-10-CM | POA: Diagnosis not present

## 2018-12-23 DIAGNOSIS — R69 Illness, unspecified: Secondary | ICD-10-CM | POA: Diagnosis not present

## 2019-01-16 ENCOUNTER — Other Ambulatory Visit: Payer: Self-pay | Admitting: Internal Medicine

## 2019-01-16 DIAGNOSIS — Z1231 Encounter for screening mammogram for malignant neoplasm of breast: Secondary | ICD-10-CM

## 2019-01-19 DIAGNOSIS — R69 Illness, unspecified: Secondary | ICD-10-CM | POA: Diagnosis not present

## 2019-01-20 ENCOUNTER — Ambulatory Visit: Payer: PPO | Admitting: Internal Medicine

## 2019-01-26 DIAGNOSIS — R69 Illness, unspecified: Secondary | ICD-10-CM | POA: Diagnosis not present

## 2019-01-28 ENCOUNTER — Other Ambulatory Visit: Payer: Self-pay | Admitting: Internal Medicine

## 2019-01-29 ENCOUNTER — Encounter: Payer: Self-pay | Admitting: Internal Medicine

## 2019-01-29 ENCOUNTER — Ambulatory Visit (INDEPENDENT_AMBULATORY_CARE_PROVIDER_SITE_OTHER): Payer: Medicare HMO | Admitting: Internal Medicine

## 2019-01-29 ENCOUNTER — Other Ambulatory Visit: Payer: Self-pay

## 2019-01-29 VITALS — Ht 64.5 in | Wt 203.0 lb

## 2019-01-29 DIAGNOSIS — Z0000 Encounter for general adult medical examination without abnormal findings: Secondary | ICD-10-CM | POA: Diagnosis not present

## 2019-01-29 DIAGNOSIS — D126 Benign neoplasm of colon, unspecified: Secondary | ICD-10-CM

## 2019-01-29 DIAGNOSIS — K760 Fatty (change of) liver, not elsewhere classified: Secondary | ICD-10-CM

## 2019-01-29 DIAGNOSIS — I10 Essential (primary) hypertension: Secondary | ICD-10-CM | POA: Diagnosis not present

## 2019-01-29 DIAGNOSIS — I8510 Secondary esophageal varices without bleeding: Secondary | ICD-10-CM | POA: Diagnosis not present

## 2019-01-29 DIAGNOSIS — E669 Obesity, unspecified: Secondary | ICD-10-CM

## 2019-01-29 DIAGNOSIS — E034 Atrophy of thyroid (acquired): Secondary | ICD-10-CM | POA: Diagnosis not present

## 2019-01-29 DIAGNOSIS — E1169 Type 2 diabetes mellitus with other specified complication: Secondary | ICD-10-CM

## 2019-01-29 DIAGNOSIS — E782 Mixed hyperlipidemia: Secondary | ICD-10-CM | POA: Diagnosis not present

## 2019-01-29 DIAGNOSIS — E119 Type 2 diabetes mellitus without complications: Secondary | ICD-10-CM

## 2019-01-29 MED ORDER — METOPROLOL SUCCINATE ER 25 MG PO TB24: 25.0000 mg | ORAL_TABLET | Freq: Every day | ORAL | 3 refills | Status: DC

## 2019-01-29 NOTE — Patient Instructions (Signed)
I am adding metoprolol succinate 25 mg at bedtime for your blood pressure  Continue to take the telmisartan 80 mg at night as well  I am referring you to [facility] for management of your fatty liver disease AND for your  overdue colonosopy  Janett Billow will call you to set up your fasting labs

## 2019-01-29 NOTE — Progress Notes (Signed)
Patient ID: Marissa Rodriguez, female    DOB: 06-26-1940  Age: 78 y.o. MRN: [id number]  Virtual Visit via [email or web address]  This visit type was conducted due to national recommendations for restrictions regarding the COVID-19 pandemic (e.g. social distancing).  This format is felt to be most appropriate for this patient at this time.  All issues noted in this document were discussed and addressed.  No physical exam was performed (except for noted visual exam findings with Video Visits).   I connected with@ on 01/29/19 at 11:00 AM EDT by a video enabled telemedicine application  and verified that I am speaking with the correct person using two identifiers. Location patient: home Location provider: work or home office Persons participating in the virtual visit: patient, provider  I discussed the limitations, risks, security and privacy concerns of performing an evaluation and management service by telephone and the availability of in person appointments. I also discussed with the patient that there may be a patient responsible charge related to this service. The patient expressed understanding and agreed to proceed.  The patient is here for annual preventive examination and management of other chronic and acute problems.   The risk factors are reflected in the social history.  The roster of all physicians providing medical care to patient - is listed in the Snapshot section of the chart.  Activities of daily living:  The patient is 100% independent in all ADLs: dressing, toileting, feeding as well as independent mobility  Home safety : The patient has smoke detectors in the home. They wear seatbelts.  There are no firearms at home. There is no violence in the home.   There is no risks for hepatitis, STDs or HIV. There is no   history of blood transfusion. They have no travel history to infectious disease endemic areas of the world.  The patient has seen their dentist in the last six month. They have seen  their eye doctor in the last year. They admit to slight hearing difficulty with regard to whispered voices and some television programs.  They have deferred audiologic testing in the last year.  They do not  have excessive sun exposure. Discussed the need for sun protection: hats, long sleeves and use of sunscreen if there is significant sun exposure.   Diet: the importance of a healthy diet is discussed. They do have a healthy diet.  The benefits of regular aerobic exercise were discussed. She is not walking or engaging in any type of regular exercise.   Depression screen: there are no signs or vegative symptoms of depression- irritability, change in appetite, anhedonia, sadness/tearfullness.  The following portions of the patient's history were reviewed and updated as appropriate: allergies, current medications, past family history, past medical history,  past surgical history, past social history  and problem list.  Visual acuity was not assessed per patient preference since she has regular follow up with her ophthalmologist. Hearing and body mass index were assessed and reviewed.   During the course of the visit the patient was educated and counseled about appropriate screening and preventive services including : fall prevention , diabetes screening, nutrition counseling, colorectal cancer screening, and recommended immunizations.    CC: The primary encounter diagnosis was Encounter for preventive health examination. Diagnoses of NAFLD (nonalcoholic fatty liver disease), Tubular adenoma of colon, Secondary esophageal varices without bleeding (Marissa Rodriguez), Diabetes mellitus type 2 in obese [facility]), Hypothyroidism due to acquired atrophy of thyroid, Essential hypertension, and Mixed hyperlipidemia were also pertinent to this visit.  1) Tpe 2 DM:  Patient does not check blood sugars more than once a month,  Last one was 135 a month ago in a fasting state.  Dos not recall any above 200 or less than 80.  No  complaints today.  Taking his medications as directed,  Not exercising on a regular basis or trying to lose weight.  Patient voices awareness  of the foods he/she needs to avoid,  And follows a low GI diet about 50% of the time.  Has not had an annual diabetic eye exam.  Denies numbness and tingling in lower extremities.  Denies hypoglycemic symptoms.   2) NAFLD:  She was referred to GI last year but declined the appointment despite being overdue for colonoscopy as well  3) Hypertension: patient checks blood pressure twice weekly at home.  Readings have been for the most part > 140/80 at rest . Patient is following a reduce salt diet most days and is taking medications as prescribed  History Marissa Rodriguez has a past medical history of History of MRI of brain and brain stem (May 2012), Hyperlipidemia, Hypertension, Hypothyroidism, and Tubular adenoma of colon (Nov 2010).   She has a past surgical history that includes Abdominal hysterectomy (1975) and Appendectomy (1975).   Her family history includes Breast cancer (age of onset: 62) in her sister; Cancer in her father.She reports that she has never smoked. She has never used smokeless tobacco. She reports that she does not drink alcohol or use drugs.  Outpatient Medications Prior to Visit  Medication Sig Dispense Refill  . glucose blood test strip Use to check blood sugars up to twice daily. 100 each 2  . levothyroxine (SYNTHROID, LEVOTHROID) 100 MCG tablet TAKE 1 TABLET BY MOUTH ONCE DAILY BEFORE BREAKFAST 90 tablet 1  . simvastatin (ZOCOR) 40 MG tablet Take 1 tablet by mouth once daily 90 tablet 0  . telmisartan (MICARDIS) 80 MG tablet Take 1 tablet by mouth once daily 90 tablet 1  . metFORMIN (GLUCOPHAGE) 850 MG tablet TAKE 1 TABLET BY MOUTH TWICE DAILY WITH MEALS 60 tablet 0  . lisinopril (PRINIVIL,ZESTRIL) 40 MG tablet TAKE 1 TABLET BY MOUTH ONCE DAILY (OFFICE  VISIT  NEEDED  PRIOR  TO  ANY  MORE  REFILLS) (Patient not taking: Reported on 01/29/2019)  90 tablet 0   No facility-administered medications prior to visit.     Review of Systems  Objective:  Ht 5' 4.5" (1.638 m)   Wt 203 lb (92.1 kg)   BMI 34.31 kg/m   Physical Exam   General impression: alert, cooperative and articulate.  No signs of being in distress  HEENT: no conjunctivitis, rhinitis or obvious lymphadenopathy or thyromegaly.  Lungs: RR is normal,  speech is fluent sentence length suggests that patient is not short of breath and not punctuated by cough, sneezing or sniffing. .   Ext: no edema or bruising. Psych: affect normal.  speech is articulate and non pressured .  Denies suicidal thoughts      Assessment & Plan:   Problem List Items Addressed This Visit      Unprioritized   Hyperlipidemia    She is tolerating statin and overdue for enzymes.   Lab Results  Component Value Date   CHOL 165 01/20/2018   HDL 38.60 (L) 01/20/2018   LDLCALC 97 01/20/2018   LDLDIRECT 113.0 09/22/2015   TRIG 148.0 01/20/2018   CHOLHDL 4 01/20/2018   Lab Results  Component Value Date   ALT 31 01/20/2018  AST 33 01/20/2018   ALKPHOS 37 (L) 01/20/2018   BILITOT 0.3 01/20/2018            Relevant Medications   metoprolol succinate (TOPROL-XL) 25 MG 24 hr tablet   Hypertension    Home readings are not at goal,  And she has NAFLD with esophageal varices .  Adding Toprol XL 25 mg daily       Relevant Medications   metoprolol succinate (TOPROL-XL) 25 MG 24 hr tablet   Hypothyroidism    She feels improved energy level on the higher dose and TSH was closer to 1. 0  Repeat level needed   Lab Results  Component Value Date   TSH 1.30 03/05/2018         Relevant Medications   metoprolol succinate (TOPROL-XL) 25 MG 24 hr tablet   Tubular adenoma of colon    She is overdue for 5 yr follow up  Colonoscopy  By 5 years.  Assessment STRONGLY encouraged.       Relevant Orders   Ambulatory referral to Gastroenterology   Secondary esophageal varices without  bleeding [facility])    Patient has repeatedly deferred GI referral, referral again made;  Patient prescribed metoprolol succinate 25 mg today      Relevant Medications   metoprolol succinate (TOPROL-XL) 25 MG 24 hr tablet   Other Relevant Orders   CBC with Differential/Platelet   Encounter for preventive health examination - Primary    age appropriate education and counseling updated, referrals for preventative services and immunizations addressed, dietary and smoking counseling addressed, most recent labs reviewed.  I have personally reviewed and have noted:  1) the patient's medical and social history 2) The pt's use of alcohol, tobacco, and illicit drugs 3) The patient's current medications and supplements 4) Functional ability including ADL's, fall risk, home safety risk, hearing and visual impairment 5) Diet and physical activities 6) Evidence for depression or mood disorder 7) The patient's height, weight, and BMI have been recorded in the chart  I have made referrals, and provided counseling and education based on review of the above      NAFLD (nonalcoholic fatty liver disease)   Relevant Orders   Ambulatory referral to Gastroenterology   AFP tumor marker   Diabetes mellitus type 2 in obese ([place])    a1c had risen last year and she was advised to increase metformin  dose to 850 mg bid and follow up in 3 months.  She has not been seen sine July 2019.    Patient is tolerating statin therapy for CAD risk reduction and ACE I for management of  microscopic proteinuria. Lab Results  Component Value Date   HGBA1C 7.4 (H) 01/20/2018   Lab Results  Component Value Date   MICROALBUR 4.8 (H) 01/20/2018               I discussed the assessment and treatment plan with the patient. The patient was provided an opportunity to ask questions and all were answered. The patient agreed with the plan and demonstrated an understanding of the instructions.   The patient was advised to call  back or seek an in-person evaluation if the symptoms worsen or if the condition fails to improve as anticipated.  I provided 50 minutes of non-face-to-face time during this encounter.   Crecencio Mc, MD     I have discontinued [place] lisinopril. I am also having her start on metoprolol succinate. Additionally, I am having her maintain her  levothyroxine, glucose blood, simvastatin, and telmisartan.  Meds ordered this encounter  Medications  . metoprolol succinate (TOPROL-XL) 25 MG 24 hr tablet    Sig: Take 1 tablet (25 mg total) by mouth at bedtime.    Dispense:  90 tablet    Refill:  3    Medications Discontinued During This Encounter  Medication Reason  . lisinopril (PRINIVIL,ZESTRIL) 40 MG tablet Error    Follow-up: No follow-ups on file.   Crecencio Mc, MD

## 2019-01-30 ENCOUNTER — Other Ambulatory Visit: Payer: Self-pay | Admitting: Internal Medicine

## 2019-01-31 ENCOUNTER — Encounter: Payer: Self-pay | Admitting: Internal Medicine

## 2019-01-31 NOTE — Assessment & Plan Note (Signed)
Home readings are not at goal,  And she has NAFLD with esophageal varices .  Adding Toprol XL 25 mg daily

## 2019-01-31 NOTE — Assessment & Plan Note (Signed)
Patient has repeatedly deferred GI referral, referral again made;  Patient prescribed metoprolol succinate 25 mg today

## 2019-01-31 NOTE — Assessment & Plan Note (Signed)
She feels improved energy level on the higher dose and TSH was closer to 1. 0  Repeat level needed   Lab Results  Component Value Date   TSH 1.30 03/05/2018

## 2019-01-31 NOTE — Assessment & Plan Note (Signed)
a1c had risen last year and she was advised to increase metformin  dose to 850 mg bid and follow up in 3 months.  She has not been seen sine July 2019.    Patient is tolerating statin therapy for CAD risk reduction and ACE I for management of  microscopic proteinuria. Lab Results  Component Value Date   HGBA1C 7.4 (H) 01/20/2018   Lab Results  Component Value Date   MICROALBUR 4.8 (H) 01/20/2018

## 2019-01-31 NOTE — Assessment & Plan Note (Signed)

## 2019-01-31 NOTE — Assessment & Plan Note (Signed)
She is overdue for 5 yr follow up  Colonoscopy  By 5 years.  Assessment STRONGLY encouraged.

## 2019-01-31 NOTE — Assessment & Plan Note (Signed)
She is tolerating statin and overdue for enzymes.   Lab Results  Component Value Date   CHOL 165 01/20/2018   HDL 38.60 (L) 01/20/2018   LDLCALC 97 01/20/2018   LDLDIRECT 113.0 09/22/2015   TRIG 148.0 01/20/2018   CHOLHDL 4 01/20/2018   Lab Results  Component Value Date   ALT 31 01/20/2018   AST 33 01/20/2018   ALKPHOS 37 (L) 01/20/2018   BILITOT 0.3 01/20/2018

## 2019-02-06 ENCOUNTER — Telehealth: Payer: Self-pay | Admitting: Internal Medicine

## 2019-02-06 NOTE — Telephone Encounter (Signed)
Called and spoke to patient.  Patient said that her glucometer stopped working.  Pt said that she replaced the batteries but glucometer goes blank and will not give reading when she inserts the blood sample.   Offered pt a free glucometer from office.  Patient is ok as long as she can get an Rx for the lancets.  Informed pt that someone will call her when glucometer is ready for pick up.

## 2019-02-06 NOTE — Telephone Encounter (Signed)
Medication Refill - Medication: one touch ultra test kit   Has the patient contacted their pharmacy? Yes.   (Agent: If no, request that the patient contact the pharmacy for the refill.) (Agent: If yes, when and what did the pharmacy advise?)  Preferred Pharmacy (with phone number or street name): [facility] [address]    Agent: Please be advised that RX refills may take up to 3 business days. We ask that you follow-up with your pharmacy.

## 2019-02-09 ENCOUNTER — Telehealth: Payer: Self-pay

## 2019-02-09 MED ORDER — BLOOD GLUCOSE METER KIT: PACK | 0 refills | Status: AC

## 2019-02-09 NOTE — Telephone Encounter (Signed)
New glucose meter sent in cause pt's is broken

## 2019-02-13 NOTE — Telephone Encounter (Signed)
LMTCB

## 2019-02-14 DIAGNOSIS — R69 Illness, unspecified: Secondary | ICD-10-CM | POA: Diagnosis not present

## 2019-02-16 ENCOUNTER — Other Ambulatory Visit: Payer: Self-pay | Admitting: Internal Medicine

## 2019-02-16 DIAGNOSIS — E782 Mixed hyperlipidemia: Secondary | ICD-10-CM

## 2019-02-18 NOTE — Telephone Encounter (Signed)
Spoke with pt and she stated that she pick up the glucometer from [place] on Monday.

## 2019-02-19 ENCOUNTER — Other Ambulatory Visit (INDEPENDENT_AMBULATORY_CARE_PROVIDER_SITE_OTHER): Payer: Medicare HMO

## 2019-02-19 ENCOUNTER — Ambulatory Visit
Admission: RE | Admit: 2019-02-19 | Discharge: 2019-02-19 | Disposition: A | Discharge status: Home / Self Care | Payer: Medicare HMO | Source: Ambulatory Visit | Attending: Internal Medicine | Admitting: Internal Medicine

## 2019-02-19 ENCOUNTER — Other Ambulatory Visit: Payer: Self-pay

## 2019-02-19 DIAGNOSIS — Z1231 Encounter for screening mammogram for malignant neoplasm of breast: Secondary | ICD-10-CM | POA: Insufficient documentation

## 2019-02-19 DIAGNOSIS — K760 Fatty (change of) liver, not elsewhere classified: Secondary | ICD-10-CM | POA: Diagnosis not present

## 2019-02-19 DIAGNOSIS — I8510 Secondary esophageal varices without bleeding: Secondary | ICD-10-CM | POA: Diagnosis not present

## 2019-02-19 DIAGNOSIS — E119 Type 2 diabetes mellitus without complications: Secondary | ICD-10-CM

## 2019-02-19 DIAGNOSIS — E034 Atrophy of thyroid (acquired): Secondary | ICD-10-CM

## 2019-02-19 DIAGNOSIS — E669 Obesity, unspecified: Secondary | ICD-10-CM | POA: Diagnosis not present

## 2019-02-19 DIAGNOSIS — E1169 Type 2 diabetes mellitus with other specified complication: Secondary | ICD-10-CM | POA: Diagnosis not present

## 2019-02-19 LAB — COMPREHENSIVE METABOLIC PANEL WITH GFR
ALT: 23 U/L (ref 0–35)
AST: 25 U/L (ref 0–37)
Albumin: 4.4 g/dL (ref 3.5–5.2)
Alkaline Phosphatase: 48 U/L (ref 39–117)
BUN: 36 mg/dL — ABNORMAL HIGH (ref 6–23)
CO2: 25 meq/L (ref 19–32)
Calcium: 9.4 mg/dL (ref 8.4–10.5)
Chloride: 103 meq/L (ref 96–112)
Creatinine, Ser: 1.36 mg/dL — ABNORMAL HIGH (ref 0.40–1.20)
GFR: 37.69 mL/min — ABNORMAL LOW
Glucose, Bld: 147 mg/dL — ABNORMAL HIGH (ref 70–99)
Potassium: 4.9 meq/L (ref 3.5–5.1)
Sodium: 139 meq/L (ref 135–145)
Total Bilirubin: 0.4 mg/dL (ref 0.2–1.2)
Total Protein: 7.5 g/dL (ref 6.0–8.3)

## 2019-02-19 LAB — LIPID PANEL
Cholesterol: 147 mg/dL (ref 0–200)
HDL: 34.5 mg/dL — ABNORMAL LOW
LDL Cholesterol: 73 mg/dL (ref 0–99)
NonHDL: 112.16
Total CHOL/HDL Ratio: 4
Triglycerides: 194 mg/dL — ABNORMAL HIGH (ref 0.0–149.0)
VLDL: 38.8 mg/dL (ref 0.0–40.0)

## 2019-02-19 LAB — CBC WITH DIFFERENTIAL/PLATELET
Basophils Absolute: 0.1 10*3/uL (ref 0.0–0.1)
Basophils Relative: 0.7 % (ref 0.0–3.0)
Eosinophils Absolute: 0.2 10*3/uL (ref 0.0–0.7)
Eosinophils Relative: 1.8 % (ref 0.0–5.0)
HCT: 38.6 % (ref 36.0–46.0)
Hemoglobin: 12.7 g/dL (ref 12.0–15.0)
Lymphocytes Relative: 26.2 % (ref 12.0–46.0)
Lymphs Abs: 2.5 10*3/uL (ref 0.7–4.0)
MCHC: 33 g/dL (ref 30.0–36.0)
MCV: 87.6 fl (ref 78.0–100.0)
Monocytes Absolute: 0.6 10*3/uL (ref 0.1–1.0)
Monocytes Relative: 6.3 % (ref 3.0–12.0)
Neutro Abs: 6.1 10*3/uL (ref 1.4–7.7)
Neutrophils Relative %: 65 % (ref 43.0–77.0)
Platelets: 262 10*3/uL (ref 150.0–400.0)
RBC: 4.41 Mil/uL (ref 3.87–5.11)
RDW: 13.6 % (ref 11.5–15.5)
WBC: 9.4 10*3/uL (ref 4.0–10.5)

## 2019-02-19 LAB — TSH: TSH: 2.22 u[IU]/mL (ref 0.35–4.50)

## 2019-02-19 LAB — MICROALBUMIN / CREATININE URINE RATIO
Creatinine,U: 201.4 mg/dL
Microalb Creat Ratio: 3.9 mg/g (ref 0.0–30.0)
Microalb, Ur: 7.8 mg/dL — ABNORMAL HIGH (ref 0.0–1.9)

## 2019-02-19 LAB — HEMOGLOBIN A1C: Hgb A1c MFr Bld: 7.7 % — ABNORMAL HIGH (ref 4.6–6.5)

## 2019-02-23 LAB — AFP TUMOR MARKER: AFP-Tumor Marker: 2.5 ng/mL

## 2019-02-25 ENCOUNTER — Other Ambulatory Visit: Payer: Self-pay

## 2019-02-25 ENCOUNTER — Encounter: Payer: Self-pay | Admitting: Gastroenterology

## 2019-02-25 ENCOUNTER — Ambulatory Visit (INDEPENDENT_AMBULATORY_CARE_PROVIDER_SITE_OTHER): Payer: Medicare HMO | Admitting: Gastroenterology

## 2019-02-25 VITALS — BP 158/77 | HR 76 | Temp 98.0°F | Ht 64.5 in | Wt 205.8 lb

## 2019-02-25 DIAGNOSIS — K760 Fatty (change of) liver, not elsewhere classified: Secondary | ICD-10-CM | POA: Diagnosis not present

## 2019-02-25 NOTE — Patient Instructions (Signed)
You are scheduled for a RUQ abdominal US at [facility] outpatient imaging on Tuesday, Sept 8th at 8:00am. Please arrive at the registration desk at 7:45am. You cannot have anything to eat or drink after midnight on Monday night.  If you need to reschedule this appointment for any reason, please contact central scheduling at [phone].

## 2019-02-25 NOTE — Progress Notes (Signed)
Gastroenterology Consultation  Referring Provider:     Crecencio Mc, MD Primary Care Physician:  Crecencio Mc, MD Primary Gastroenterologist:  Dr. Allen Norris     Reason for Consultation:     Fatty liver disease        HPI:   Marissa Rodriguez is a 78 y.o. y/o female referred for consultation & management of fatty liver disease by Dr. Derrel Nip, Aris Everts, MD.  This patient was sent here for a history of fatty liver disease with a report of varices.  The patient denies having a CT scan in the last few years.  From her medical record it looks like she had a CT scan of the abdomen back in January 2011.  At that time the patient had a bowel wall thickening involving the descending colon and pericolic inflammation concerning for colitis secondary to infection, inflammation versus ischemic colitis.  There was also a suggestion that the patient may have a paraesophageal hernia versus a soft tissue mass versus a esophageal diverticulum.  At that time the patient underwent a colonoscopy with adenomatous polyps found that a repeat colonoscopy was recommended to be done in 2015.  The patient did not follow-up with that procedure.  The patient underwent a ultrasound of the abdomen on November 6 of 2015 that showed fatty infiltration of the liver no gallstones or other abnormalities were noted.  She denies any abdominal pain jaundice fevers, chills, nausea or vomiting.  Past Medical History:  Diagnosis Date  . History of MRI of brain and brain stem May 2012   normal MRI/MRA done to rule out CVA  . Hyperlipidemia   . Hypertension   . Hypothyroidism   . Tubular adenoma of colon Nov 2010   repeat due 2015    Past Surgical History:  Procedure Laterality Date  . ABDOMINAL HYSTERECTOMY  1975   TAH/LSO, wedge resection of right  . APPENDECTOMY  1975   incidental, done during TAH    Prior to Admission medications   Medication Sig Start Date End Date Taking? Authorizing Provider  blood glucose meter kit and  supplies Dispense based on patient and insurance preference. Use to check blood sugars twice daily. (FOR ICD-10 E11.69). 02/09/19   Crecencio Mc, MD  glucose blood test strip Use to check blood sugars up to twice daily. 10/22/18   Crecencio Mc, MD  levothyroxine (SYNTHROID, LEVOTHROID) 100 MCG tablet TAKE 1 TABLET BY MOUTH ONCE DAILY BEFORE BREAKFAST 09/17/18   Crecencio Mc, MD  metFORMIN (GLUCOPHAGE) 850 MG tablet TAKE 1 TABLET BY MOUTH TWICE DAILY WITH MEALS. OFFICE VISIT NEEDED PRIOR TO ANY MORE REFILLS. 01/30/19   Crecencio Mc, MD  metoprolol succinate (TOPROL-XL) 25 MG 24 hr tablet Take 1 tablet (25 mg total) by mouth at bedtime. 01/29/19   Crecencio Mc, MD  simvastatin (ZOCOR) 40 MG tablet Take 1 tablet by mouth once daily 02/16/19   Crecencio Mc, MD  telmisartan (MICARDIS) 80 MG tablet Take 1 tablet by mouth once daily 01/28/19   Crecencio Mc, MD    Family History  Problem Relation Age of Onset  . Cancer Father        Lung   . Breast cancer Sister 72     Social History   Tobacco Use  . Smoking status: Never Smoker  . Smokeless tobacco: Never Used  Substance Use Topics  . Alcohol use: No  . Drug use: No    Allergies as of  02/25/2019  . (No Known Allergies)    Review of Systems:    All systems reviewed and negative except where noted in HPI.   Physical Exam:  There were no vitals taken for this visit. No LMP recorded. Patient has had a hysterectomy. General:   Alert,  Well-developed, well-nourished, pleasant and cooperative in NAD Head:  Normocephalic and atraumatic. Eyes:  Sclera clear, no icterus.   Conjunctiva pink. Ears:  Normal auditory acuity. Nose:  No deformity, discharge, or lesions. Mouth:  No deformity or lesions,oropharynx pink & moist. Neck:  Supple; no masses or thyromegaly. Lungs:  Respirations even and unlabored.  Clear throughout to auscultation.   No wheezes, crackles, or rhonchi. No acute distress. Heart:  Regular rate and rhythm; no  murmurs, clicks, rubs, or gallops. Abdomen:  Normal bowel sounds.  No bruits.  Soft, non-tender and non-distended without masses, hepatosplenomegaly or hernias noted.  No guarding or rebound tenderness.  Negative Carnett sign.   Rectal:  Deferred.  Msk:  Symmetrical without gross deformities.  Good, equal movement & strength bilaterally. Pulses:  Normal pulses noted. Extremities:  No clubbing or edema.  No cyanosis. Neurologic:  Alert and oriented x3;  grossly normal neurologically. Skin:  Intact without significant lesions or rashes.  No jaundice. Lymph Nodes:  No significant cervical adenopathy. Psych:  Alert and cooperative. Normal mood and affect.  Imaging Studies: Mm 3d Screen Breast Bilateral  Result Date: 02/19/2019 CLINICAL DATA:  Screening. EXAM: DIGITAL SCREENING BILATERAL MAMMOGRAM WITH TOMO AND CAD COMPARISON:  Previous exam(s). ACR Breast Density Category c: The breast tissue is heterogeneously dense, which may obscure small masses. FINDINGS: There are no findings suspicious for malignancy. Images were processed with CAD. IMPRESSION: No mammographic evidence of malignancy. A result letter of this screening mammogram will be mailed directly to the patient. RECOMMENDATION: Screening mammogram in one year. (Code:[id number]-01Y) BI-RADS CATEGORY  1: Negative. Electronically Signed   By: Dorise Bullion III M.D   On: 02/19/2019 09:35    Assessment and Plan:   Marissa Rodriguez is a 78 y.o. y/o female who comes in today with a referral for fatty liver with varices.  The patient has an ultrasound from 5 years ago that did show fatty infiltration but I cannot find any imaging that shows the patient to have been reported to have cirrhosis or varices.  The patient will be set up for a right upper quadrant ultrasound.  The patient will also need a colonoscopy due to her history of adenomatous polyps.  If she does have cirrhosis of her hypertension the patient will also need an upper endoscopy.  The  results of the ultrasound will determine whether she just has a colonoscopy or both the colonoscopy and upper endoscopy.  The patient has been explained the plan and agrees with it.  Lucilla Lame, MD. Marval Regal    Note: This dictation was prepared with Dragon dictation along with smaller phrase technology. Any transcriptional errors that result from this process are unintentional.

## 2019-03-03 ENCOUNTER — Ambulatory Visit
Admission: RE | Admit: 2019-03-03 | Discharge: 2019-03-03 | Disposition: A | Discharge status: Home / Self Care | Payer: Medicare HMO | Source: Ambulatory Visit | Attending: Gastroenterology | Admitting: Gastroenterology

## 2019-03-03 ENCOUNTER — Other Ambulatory Visit: Payer: Self-pay

## 2019-03-03 DIAGNOSIS — K760 Fatty (change of) liver, not elsewhere classified: Secondary | ICD-10-CM | POA: Diagnosis present

## 2019-03-05 ENCOUNTER — Telehealth: Payer: Self-pay

## 2019-03-05 ENCOUNTER — Other Ambulatory Visit: Payer: Self-pay

## 2019-03-05 DIAGNOSIS — Z860100 Personal history of colon polyps, unspecified: Secondary | ICD-10-CM

## 2019-03-05 MED ORDER — NA SULFATE-K SULFATE-MG SULF 17.5-3.13-1.6 GM/177ML PO SOLN: 1.0000 | Freq: Once | ORAL | 0 refills | Status: AC

## 2019-03-05 NOTE — Telephone Encounter (Signed)
Pt notified of US results.  

## 2019-03-05 NOTE — Telephone Encounter (Signed)
-----   Message from Lucilla Lame, MD sent at 03/04/2019  4:36 PM EDT ----- Let the patient know that the U/S showed fatty liver and her liver enzymes are normal. She does not have any sign of cirrhosis and she should try and decrease her Triglycerides and loss weight. Otherwise she does not need ay further workup for her liver with normal enzymes.

## 2019-03-09 ENCOUNTER — Ambulatory Visit (INDEPENDENT_AMBULATORY_CARE_PROVIDER_SITE_OTHER): Payer: Medicare HMO

## 2019-03-09 ENCOUNTER — Ambulatory Visit: Payer: PPO | Admitting: Internal Medicine

## 2019-03-09 ENCOUNTER — Other Ambulatory Visit: Payer: Self-pay

## 2019-03-09 DIAGNOSIS — Z0000 Encounter for general adult medical examination without abnormal findings: Secondary | ICD-10-CM

## 2019-03-09 NOTE — Progress Notes (Signed)
Subjective:   Marissa Rodriguez is a 78 y.o. female who presents for Medicare Annual (Subsequent) preventive examination.  Review of Systems:  No ROS.  Medicare Wellness Virtual Visit.  Visual/audio telehealth visit, UTA vital signs.   See social history for additional risk factors.   Cardiac Risk Factors include: advanced age (>29mn, >>28women);hypertension;diabetes mellitus     Objective:     Vitals: There were no vitals taken for this visit.  There is no height or weight on file to calculate BMI.  Advanced Directives 03/09/2019 03/05/2018 07/22/2015  Does Patient Have a Medical Advance Directive? No No No  Would patient like information on creating a medical advance directive? No - Patient declined Yes (MAU/Ambulatory/Procedural Areas - Information given) No - patient declined information    Tobacco Social History   Tobacco Use  Smoking Status Never Smoker  Smokeless Tobacco Never Used     Counseling given: Not Answered   Clinical Intake:  Pre-visit preparation completed: Yes        Diabetes: Yes(Followed by pcp)  How often do you need to have someone help you when you read instructions, pamphlets, or other written materials from your doctor or pharmacy?: 1 - Never  Interpreter Needed?: No     Past Medical History:  Diagnosis Date  . History of MRI of brain and brain stem May 2012   normal MRI/MRA done to rule out CVA  . Hyperlipidemia   . Hypertension   . Hypothyroidism   . Tubular adenoma of colon Nov 2010   repeat due 2015   Past Surgical History:  Procedure Laterality Date  . ABDOMINAL HYSTERECTOMY  1975   TAH/LSO, wedge resection of right  . APPENDECTOMY  1975   incidental, done during TAH   Family History  Problem Relation Age of Onset  . Cancer Father        Lung   . Breast cancer Sister 754  Social History   Socioeconomic History  . Marital status: Widowed    Spouse name: Not on file  . Number of children: Not on file  . Years of  education: Not on file  . Highest education level: Not on file  Occupational History  . Not on file  Social Needs  . Financial resource strain: Not hard at all  . Food insecurity    Worry: Never true    Inability: Never true  . Transportation needs    Medical: No    Non-medical: No  Tobacco Use  . Smoking status: Never Smoker  . Smokeless tobacco: Never Used  Substance and Sexual Activity  . Alcohol use: No  . Drug use: No  . Sexual activity: Not on file  Lifestyle  . Physical activity    Days per week: Not on file    Minutes per session: Not on file  . Stress: Not on file  Relationships  . Social [place] phone: Not on file    Gets together: Not on file    Attends religious service: Not on file    Active member of club or organization: Not on file    Attends meetings of clubs or organizations: Not on file    Relationship status: Not on file  Other Topics Concern  . Not on file  Social History Narrative  . Not on file    Outpatient Encounter Medications as of 03/09/2019  Medication Sig  . blood glucose meter kit and supplies Dispense based on patient  and insurance preference. Use to check blood sugars twice daily. (FOR ICD-10 E11.69).  Marland Kitchen glucose blood test strip Use to check blood sugars up to twice daily.  . Lancets (ONETOUCH DELICA PLUS [id number]) MISC USE TO TEST BLOOD SUGAR TWICE DAILY  . levothyroxine (SYNTHROID, LEVOTHROID) 100 MCG tablet TAKE 1 TABLET BY MOUTH ONCE DAILY BEFORE BREAKFAST  . metFORMIN (GLUCOPHAGE) 850 MG tablet TAKE 1 TABLET BY MOUTH TWICE DAILY WITH MEALS. OFFICE VISIT NEEDED PRIOR TO ANY MORE REFILLS.  Marland Kitchen metoprolol succinate (TOPROL-XL) 25 MG 24 hr tablet Take 1 tablet (25 mg total) by mouth at bedtime.  . simvastatin (ZOCOR) 40 MG tablet Take 1 tablet by mouth once daily  . telmisartan (MICARDIS) 80 MG tablet Take 1 tablet by mouth once daily   No facility-administered encounter medications on file as of 03/09/2019.      Activities of Daily Living In your present state of health, do you have any difficulty performing the following activities: 03/09/2019  Hearing? N  Vision? N  Difficulty concentrating or making decisions? N  Walking or climbing stairs? N  Dressing or bathing? N  Doing errands, shopping? N  Preparing Food and eating ? N  Using the Toilet? N  In the past six months, have you accidently leaked urine? N  Do you have problems with loss of bowel control? N  Managing your Medications? N  Managing your Finances? N  Housekeeping or managing your Housekeeping? N  Some recent data might be hidden    Patient Care Team: Crecencio Mc, MD as PCP - General (Internal Medicine)    Assessment:   This is a routine wellness examination for [place].  I connected with patient 03/09/19 at  9:00 AM EDT by a video/audio enabled telemedicine application and verified that I am speaking with the correct person using two identifiers. Patient stated full name and DOB. Patient gave permission to continue with virtual visit. Patient's location was at home and Nurse's location was at [place] office.   Health Maintenance Due: Influenza vaccine 2020- discussed; to be completed in season with doctor or local pharmacy.    PNA - discussed; to be completed with doctor in visit or local pharmacy.  Tdap- discussed; to be completed with doctor in visit or local pharmacy.    Dexa Scan-  Mammogram-  Eye Exam- Foot Exam- Hgb A1c-  Update all pending maintenance due as appropriate.   See completed HM at the end of note.   Eye: Visual acuity not assessed. Virtual visit. Wears corrective lenses. Followed by their ophthalmologist. Retinopathy- none reported  Dental: UTD  Hearing: Demonstrates normal hearing during visit.  Safety:  Patient feels safe at home- yes Patient does have smoke detectors at home- yes Patient does wear sunscreen or protective clothing when in direct sunlight - yes Patient does wear seat  belt when in a moving vehicle - yes Patient drives- yes Adequate lighting in walkways free from debris- yes Grab bars and handrails used as appropriate- yes Ambulates with no assistive device Cell phone or lifeline/life alert/medic alert on person when ambulating outside of the home- yes  Social: Alcohol intake - no    Smoking history- never   Smokers in home? none Illicit drug use? none  Depression: PHQ 2 &9 complete. See screening below. Denies irritability, anhedonia, sadness/tearfullness.  Stable.   Falls: See screening below.    Medication: Taking as directed and without issues.   Covid-19: Precautions and sickness symptoms discussed. Wears mask, social distancing, hand hygiene  as appropriate.   Activities of Daily Living Patient denies needing assistance with: household chores, feeding themselves, getting from bed to chair, getting to the toilet, bathing/showering, dressing, managing money, or preparing meals.   Memory: Patient is alert. Patient denies difficulty focusing or concentrating. Correctly identified the president of the [place], season and recall. Patient likes to read, play computer games, complete puzzles for brain stimulation.  BMI- discussed the importance of a healthy diet, water intake and the benefits of aerobic exercise.  Educational material provided.  Physical activity- walking  Diet:  Low carb Water: good intake Caffeine: no Ensure/Protein supplement: no  Advanced Directive: End of life planning; Advanced aging; Advanced directives discussed.  No HCPOA/Living Will.  Additional information declined at this time.  Other Providers Patient Care Team: Crecencio Mc, MD as PCP - General (Internal Medicine)  Exercise Activities and Dietary recommendations Current Exercise Habits: Home exercise routine, Intensity: Mild  Goals      Patient Stated   . DIET - REDUCE SUGAR INTAKE (pt-stated)      Other   . Increase physical activity      Aerobic exercise 150 minutes per week (5 days, 30 minutes)       Fall Risk Fall Risk  03/09/2019 03/05/2018 01/22/2018 11/07/2016 03/24/2015  Falls in the past year? 0 No No No No   Timed Get Up and Go performed: no, virtual visit  Depression Screen PHQ 2/9 Scores 03/09/2019 03/05/2018 01/22/2018 11/07/2016  PHQ - 2 Score 0 0 0 0  PHQ- 9 Score - 0 1 1     Cognitive Function MMSE - Mini Mental State Exam 03/05/2018  Orientation to time 5  Orientation to Place 5  Registration 3  Recall 3  Language- repeat 1  Language- read & follow direction 1  Copy design 1     6CIT Screen 03/09/2019  What Year? 0 points  What month? 0 points  What time? 0 points  Count back from 20 0 points  Months in reverse 0 points    Immunization History  Administered Date(s) Administered  . Influenza, High Dose Seasonal PF 03/24/2015, 03/05/2018  . Influenza,inj,Quad PF,6+ Mos 07/01/2013  . Pneumococcal Conjugate-13 03/17/2014  . Pneumococcal Polysaccharide-23 07/02/2011  . Tdap 10/10/2012   Screening Tests Health Maintenance  Topic Date Due  . COLONOSCOPY  05/05/2015  . OPHTHALMOLOGY EXAM  10/17/2017  . FOOT EXAM  01/23/2019  . INFLUENZA VACCINE  09/23/2019 (Originally 01/24/2019)  . HEMOGLOBIN A1C  08/22/2019  . MAMMOGRAM  02/19/2020  . TETANUS/TDAP  10/11/2022  . DEXA SCAN  Completed  . PNA vac Low Risk Adult  Completed      Plan:    Keep all routine maintenance appointments.   Follow up 03/30/19  Medicare Attestation I have personally reviewed: The patient's medical and social history Their use of alcohol, tobacco or illicit drugs Their current medications and supplements The patient's functional ability including ADLs,fall risks, home safety risks, cognitive, and hearing and visual impairment Diet and physical activities Evidence for depression   In addition, I have reviewed and discussed with patient certain preventive protocols, quality metrics, and best practice recommendations.  A written personalized care plan for preventive services as well as general preventive health recommendations were provided to patient via mail.     Varney Biles, LPN  [id number]

## 2019-03-09 NOTE — Patient Instructions (Addendum)
  [name] , Thank you for taking time to come for your Medicare Wellness Visit. I appreciate your ongoing commitment to your health goals. Please review the following plan we discussed and let me know if I can assist you in the future.   These are the goals we discussed: Goals      Patient Stated   . DIET - REDUCE SUGAR INTAKE (pt-stated)      Other   . Increase physical activity     Aerobic exercise 150 minutes per week (5 days, 30 minutes)       This is a list of the screening recommended for you and due dates:  Health Maintenance  Topic Date Due  . Colon Cancer Screening  05/05/2015  . Eye exam for diabetics  10/17/2017  . Complete foot exam   01/23/2019  . Flu Shot  09/23/2019*  . Hemoglobin A1C  08/22/2019  . Mammogram  02/19/2020  . Tetanus Vaccine  10/11/2022  . DEXA scan (bone density measurement)  Completed  . Pneumonia vaccines  Completed  *Topic was postponed. The date shown is not the original due date.

## 2019-03-16 ENCOUNTER — Other Ambulatory Visit: Payer: Self-pay

## 2019-03-16 ENCOUNTER — Other Ambulatory Visit
Admission: RE | Admit: 2019-03-16 | Discharge: 2019-03-16 | Disposition: A | Discharge status: Home / Self Care | Payer: Medicare HMO | Source: Ambulatory Visit | Attending: Gastroenterology | Admitting: Gastroenterology

## 2019-03-16 DIAGNOSIS — Z20828 Contact with and (suspected) exposure to other viral communicable diseases: Secondary | ICD-10-CM | POA: Insufficient documentation

## 2019-03-16 DIAGNOSIS — Z01812 Encounter for preprocedural laboratory examination: Secondary | ICD-10-CM | POA: Insufficient documentation

## 2019-03-16 LAB — SARS CORONAVIRUS 2 (TAT 6-24 HRS): SARS Coronavirus 2: NEGATIVE

## 2019-03-18 NOTE — Discharge Instructions (Signed)

## 2019-03-19 ENCOUNTER — Ambulatory Visit: Payer: Medicare HMO | Admitting: Anesthesiology

## 2019-03-19 ENCOUNTER — Ambulatory Visit
Admission: RE | Admit: 2019-03-19 | Discharge: 2019-03-19 | Disposition: A | Discharge status: Home / Self Care | Payer: Medicare HMO | Attending: Gastroenterology | Admitting: Gastroenterology

## 2019-03-19 ENCOUNTER — Other Ambulatory Visit: Payer: Self-pay

## 2019-03-19 ENCOUNTER — Encounter
Admission: RE | Disposition: A | Discharge status: Home / Self Care | Payer: Self-pay | Source: Home / Self Care | Attending: Gastroenterology

## 2019-03-19 DIAGNOSIS — E785 Hyperlipidemia, unspecified: Secondary | ICD-10-CM | POA: Diagnosis not present

## 2019-03-19 DIAGNOSIS — Z7984 Long term (current) use of oral hypoglycemic drugs: Secondary | ICD-10-CM | POA: Insufficient documentation

## 2019-03-19 DIAGNOSIS — E039 Hypothyroidism, unspecified: Secondary | ICD-10-CM | POA: Diagnosis not present

## 2019-03-19 DIAGNOSIS — E119 Type 2 diabetes mellitus without complications: Secondary | ICD-10-CM | POA: Insufficient documentation

## 2019-03-19 DIAGNOSIS — Z860100 Personal history of colon polyps, unspecified: Secondary | ICD-10-CM

## 2019-03-19 DIAGNOSIS — Z7982 Long term (current) use of aspirin: Secondary | ICD-10-CM | POA: Diagnosis not present

## 2019-03-19 DIAGNOSIS — I10 Essential (primary) hypertension: Secondary | ICD-10-CM | POA: Diagnosis not present

## 2019-03-19 DIAGNOSIS — K635 Polyp of colon: Secondary | ICD-10-CM

## 2019-03-19 DIAGNOSIS — K641 Second degree hemorrhoids: Secondary | ICD-10-CM | POA: Insufficient documentation

## 2019-03-19 DIAGNOSIS — Z79899 Other long term (current) drug therapy: Secondary | ICD-10-CM | POA: Insufficient documentation

## 2019-03-19 DIAGNOSIS — K5730 Diverticulosis of large intestine without perforation or abscess without bleeding: Secondary | ICD-10-CM | POA: Diagnosis not present

## 2019-03-19 DIAGNOSIS — Z1211 Encounter for screening for malignant neoplasm of colon: Secondary | ICD-10-CM | POA: Diagnosis not present

## 2019-03-19 DIAGNOSIS — D126 Benign neoplasm of colon, unspecified: Secondary | ICD-10-CM | POA: Diagnosis not present

## 2019-03-19 DIAGNOSIS — D123 Benign neoplasm of transverse colon: Secondary | ICD-10-CM | POA: Insufficient documentation

## 2019-03-19 DIAGNOSIS — Z79890 Hormone replacement therapy (postmenopausal): Secondary | ICD-10-CM | POA: Diagnosis not present

## 2019-03-19 HISTORY — PX: COLONOSCOPY WITH PROPOFOL: SHX5780

## 2019-03-19 HISTORY — PX: POLYPECTOMY: SHX5525

## 2019-03-19 HISTORY — DX: Type 2 diabetes mellitus without complications: E11.9

## 2019-03-19 LAB — GLUCOSE, CAPILLARY
Glucose-Capillary: 158 mg/dL — ABNORMAL HIGH (ref 70–99)
Glucose-Capillary: 138 mg/dL — ABNORMAL HIGH (ref 70–99)

## 2019-03-19 SURGERY — COLONOSCOPY WITH PROPOFOL
Anesthesia: General | Site: Rectum

## 2019-03-19 MED ORDER — PROPOFOL 10 MG/ML IV BOLUS
INTRAVENOUS | Status: DC | PRN
  Administered 2019-03-19: 100 mg via INTRAVENOUS
  Administered 2019-03-19 (×2): 50 mg via INTRAVENOUS

## 2019-03-19 MED ORDER — LIDOCAINE HCL (CARDIAC) PF 100 MG/5ML IV SOSY
PREFILLED_SYRINGE | INTRAVENOUS | Status: DC | PRN
  Administered 2019-03-19: 40 mg via INTRAVENOUS

## 2019-03-19 MED ORDER — ACETAMINOPHEN 325 MG PO TABS: 325.0000 mg | ORAL_TABLET | Freq: Once | ORAL | Status: DC

## 2019-03-19 MED ORDER — STERILE WATER FOR IRRIGATION IR SOLN
Status: DC | PRN
  Administered 2019-03-19: 50 mL

## 2019-03-19 MED ORDER — ACETAMINOPHEN 160 MG/5ML PO SOLN: 325.0000 mg | Freq: Once | ORAL | Status: DC

## 2019-03-19 MED ORDER — LACTATED RINGERS IV SOLN
10.0000 mL/h | INTRAVENOUS | Status: DC
  Administered 2019-03-19: 10 mL/h via INTRAVENOUS

## 2019-03-19 SURGICAL SUPPLY — 6 items
CANISTER SUCT 1200ML W/VALVE (MISCELLANEOUS) ×2
FORCEPS BIOP RAD 4 LRG CAP 4 (CUTTING FORCEPS) ×2
GOWN CVR UNV OPN BCK APRN NK (MISCELLANEOUS) ×2
GOWN ISOL THUMB LOOP REG UNIV (MISCELLANEOUS) ×2
KIT ENDO PROCEDURE OLY (KITS) ×2
WATER STERILE IRR 250ML POUR (IV SOLUTION) ×2

## 2019-03-19 NOTE — Anesthesia Postprocedure Evaluation (Signed)
Anesthesia Post Note  Patient: Marissa Rodriguez  Procedure(s) Performed: COLONOSCOPY WITH BIOPSIES (N/A Rectum) POLYPECTOMY (N/A Rectum)  Patient location during evaluation: PACU Anesthesia Type: General Level of consciousness: awake and alert and oriented Pain management: satisfactory to patient Vital Signs Assessment: post-procedure vital signs reviewed and stable Respiratory status: spontaneous breathing, nonlabored ventilation and respiratory function stable Cardiovascular status: blood pressure returned to baseline and stable Postop Assessment: Adequate PO intake and No signs of nausea or vomiting Anesthetic complications: no    Raliegh Ip

## 2019-03-19 NOTE — Op Note (Signed)
[facility] Gastroenterology Patient Name: Marissa Rodriguez Procedure Date: 03/19/2019 10:20 AM MRN: [id number] Account #: [id number] Date of Birth: Feb 23, 1941 Admit Type: Outpatient Age: 78 Room: [facility] OR ROOM 01 Gender: Female Note Status: Finalized Procedure:            Colonoscopy Indications:          High risk colon cancer surveillance: Personal history                        of colonic polyps Providers:            Lucilla Lame MD, MD Referring MD:         Deborra Medina, MD (Referring MD) Medicines:            Propofol per Anesthesia Complications:        No immediate complications. Procedure:            Pre-Anesthesia Assessment:                       - Prior to the procedure, a History and Physical was                        performed, and patient medications and allergies were                        reviewed. The patient's tolerance of previous                        anesthesia was also reviewed. The risks and benefits of                        the procedure and the sedation options and risks were                        discussed with the patient. All questions were                        answered, and informed consent was obtained. Prior                        Anticoagulants: The patient has taken no previous                        anticoagulant or antiplatelet agents. ASA Grade                        Assessment: II - A patient with mild systemic disease.                        After reviewing the risks and benefits, the patient was                        deemed in satisfactory condition to undergo the                        procedure.                       After obtaining informed consent, the colonoscope was  passed under direct vision. Throughout the procedure,                        the patient's blood pressure, pulse, and oxygen                        saturations were monitored continuously. The was                        introduced  through the anus and advanced to the the                        cecum, identified by appendiceal orifice and ileocecal                        valve. The colonoscopy was performed without                        difficulty. The patient tolerated the procedure well.                        The quality of the bowel preparation was excellent. Findings:      The perianal and digital rectal examinations were normal.      Two sessile polyps were found in the transverse colon. The polyps were 2       to 3 mm in size. These polyps were removed with a cold biopsy forceps.       Resection and retrieval were complete.      Multiple small-mouthed diverticula were found in the sigmoid colon.      Non-bleeding internal hemorrhoids were found during retroflexion. The       hemorrhoids were Grade II (internal hemorrhoids that prolapse but reduce       spontaneously). Impression:           - Two 2 to 3 mm polyps in the transverse colon, removed                        with a cold biopsy forceps. Resected and retrieved.                       - Diverticulosis in the sigmoid colon.                       - Non-bleeding internal hemorrhoids. Recommendation:       - Discharge patient to home.                       - Resume previous diet.                       - Continue present medications. Procedure Code(s):    --- Professional ---                       [phone], Colonoscopy, flexible; with biopsy, single or                        multiple Diagnosis Code(s):    --- Professional ---                       Z86.010, Personal history of colonic  polyps                       K63.5, Polyp of colon CPT copyright 2019 American Medical Association. All rights reserved. The codes documented in this report are preliminary and upon coder review may  be revised to meet current compliance requirements. Lucilla Lame MD, MD 03/19/2019 10:45:17 AM This report has been signed electronically. Number of Addenda: 0 Note Initiated On:  03/19/2019 10:20 AM Scope Withdrawal Time: 0 hours 10 minutes 44 seconds  Total Procedure Duration: 0 hours 15 minutes 55 seconds  Estimated Blood Loss: Estimated blood loss: none.      [facility]

## 2019-03-19 NOTE — Anesthesia Preprocedure Evaluation (Signed)
Anesthesia Evaluation  Patient identified by MRN, date of birth, ID band Patient awake    Reviewed: Allergy & Precautions, H&P , NPO status , Patient's Chart, lab work & pertinent test results  Airway Mallampati: III  TM Distance: >3 FB Neck ROM: full    Dental no notable dental hx.    Pulmonary    Pulmonary exam normal breath sounds clear to auscultation       Cardiovascular hypertension, Normal cardiovascular exam Rhythm:regular Rate:Normal     Neuro/Psych    GI/Hepatic (+)   Esophageal Varices    ,   Endo/Other  diabetesHypothyroidism   Renal/GU      Musculoskeletal   Abdominal   Peds  Hematology   Anesthesia Other Findings   Reproductive/Obstetrics                             Anesthesia Physical Anesthesia Plan  ASA: III  Anesthesia Plan: General   Post-op Pain Management:    Induction: Intravenous  PONV Risk Score and Plan: 3 and Propofol infusion, Treatment may vary due to age or medical condition and TIVA  Airway Management Planned: Natural Airway  Additional Equipment:   Intra-op Plan:   Post-operative Plan:   Informed Consent: I have reviewed the patients History and Physical, chart, labs and discussed the procedure including the risks, benefits and alternatives for the proposed anesthesia with the patient or authorized representative who has indicated his/her understanding and acceptance.       Plan Discussed with: CRNA  Anesthesia Plan Comments:         Anesthesia Quick Evaluation

## 2019-03-19 NOTE — H&P (Signed)
Lucilla Lame, MD [address] [place], [place] [address] Phone:[phone] Fax : [phone]  Primary Care Physician:  Crecencio Mc, MD Primary Gastroenterologist:  Dr. Allen Norris  Pre-Procedure History & Physical: HPI:  Marissa Rodriguez is a 78 y.o. female is here for an colonoscopy.   Past Medical History:  Diagnosis Date  . Diabetes mellitus without complication ([place])   . History of MRI of brain and brain stem May 2012   normal MRI/MRA done to rule out CVA  . Hyperlipidemia   . Hypertension   . Hypothyroidism   . Tubular adenoma of colon Nov 2010   repeat due 2015    Past Surgical History:  Procedure Laterality Date  . ABDOMINAL HYSTERECTOMY  1975   TAH/LSO, wedge resection of right  . APPENDECTOMY  1975   incidental, done during TAH    Prior to Admission medications   Medication Sig Start Date End Date Taking? Authorizing Provider  aspirin EC 81 MG tablet Take 81 mg by mouth as needed.   Yes [provider]  levothyroxine (SYNTHROID, LEVOTHROID) 100 MCG tablet TAKE 1 TABLET BY MOUTH ONCE DAILY BEFORE BREAKFAST 09/17/18  Yes Crecencio Mc, MD  metFORMIN (GLUCOPHAGE) 850 MG tablet TAKE 1 TABLET BY MOUTH TWICE DAILY WITH MEALS. OFFICE VISIT NEEDED PRIOR TO ANY MORE REFILLS. 01/30/19  Yes Crecencio Mc, MD  metoprolol succinate (TOPROL-XL) 25 MG 24 hr tablet Take 1 tablet (25 mg total) by mouth at bedtime. 01/29/19  Yes Crecencio Mc, MD  Omega-3 Fatty Acids (FISH OIL) 1000 MG CAPS Take by mouth as needed.   Yes [provider]  simvastatin (ZOCOR) 40 MG tablet Take 1 tablet by mouth once daily 02/16/19  Yes Crecencio Mc, MD  telmisartan (MICARDIS) 80 MG tablet Take 1 tablet by mouth once daily 01/28/19  Yes Crecencio Mc, MD  blood glucose meter kit and supplies Dispense based on patient and insurance preference. Use to check blood sugars twice daily. (FOR ICD-10 E11.69). 02/09/19   Crecencio Mc, MD  glucose blood test strip Use to check  blood sugars up to twice daily. 10/22/18   Crecencio Mc, MD  Lancets (ONETOUCH DELICA PLUS [id number]) MISC USE TO TEST BLOOD SUGAR TWICE DAILY 02/14/19   [provider]    Allergies as of 03/05/2019  . (No Known Allergies)    Family History  Problem Relation Age of Onset  . Cancer Father        Lung   . Breast cancer Sister 40    Social History   Socioeconomic History  . Marital status: Widowed    Spouse name: Not on file  . Number of children: Not on file  . Years of education: Not on file  . Highest education level: Not on file  Occupational History  . Not on file  Social Needs  . Financial resource strain: Not hard at all  . Food insecurity    Worry: Never true    Inability: Never true  . Transportation needs    Medical: No    Non-medical: No  Tobacco Use  . Smoking status: Never Smoker  . Smokeless tobacco: Never Used  Substance and Sexual Activity  . Alcohol use: No  . Drug use: No  . Sexual activity: Not on file  Lifestyle  . Physical activity    Days per week: Not on file    Minutes per session: Not on file  . Stress: Not on  file  Relationships  . Social Herbalist on phone: Not on file    Gets together: Not on file    Attends religious service: Not on file    Active member of club or organization: Not on file    Attends meetings of clubs or organizations: Not on file    Relationship status: Not on file  . Intimate partner violence    Fear of current or ex partner: Not on file    Emotionally abused: Not on file    Physically abused: Not on file    Forced sexual activity: Not on file  Other Topics Concern  . Not on file  Social History Narrative  . Not on file    Review of Systems: See HPI, otherwise negative ROS  Physical Exam: BP (!) 150/68   Pulse 84   Temp 97.9 F (36.6 C) (Temporal)   Resp 16   Ht 5' 4.5" (1.638 m)   Wt 93.9 kg   SpO2 98%   BMI 34.98 kg/m  General:   Alert,  pleasant and cooperative in NAD  Head:  Normocephalic and atraumatic. Neck:  Supple; no masses or thyromegaly. Lungs:  Clear throughout to auscultation.    Heart:  Regular rate and rhythm. Abdomen:  Soft, nontender and nondistended. Normal bowel sounds, without guarding, and without rebound.   Neurologic:  Alert and  oriented x4;  grossly normal neurologically.  Impression/Plan: Marissa Rodriguez is here for an colonoscopy to be performed for adenomatous polyps on 05/04/2010  Risks, benefits, limitations, and alternatives regarding  colonoscopy have been reviewed with the patient.  Questions have been answered.  All parties agreeable.   Lucilla Lame, MD  03/19/2019, 10:11 AM

## 2019-03-19 NOTE — Transfer of Care (Signed)
Immediate Anesthesia Transfer of Care Note  Patient: Marissa Rodriguez  Procedure(s) Performed: COLONOSCOPY WITH BIOPSIES (N/A Rectum) POLYPECTOMY (N/A Rectum)  Patient Location: PACU  Anesthesia Type: General  Level of Consciousness: awake, alert  and patient cooperative  Airway and Oxygen Therapy: Patient Spontanous Breathing and Patient connected to supplemental oxygen  Post-op Assessment: Post-op Vital signs reviewed, Patient's Cardiovascular Status Stable, Respiratory Function Stable, Patent Airway and No signs of Nausea or vomiting  Post-op Vital Signs: Reviewed and stable  Complications: No apparent anesthesia complications

## 2019-03-20 ENCOUNTER — Encounter: Payer: Self-pay | Admitting: Gastroenterology

## 2019-03-23 ENCOUNTER — Encounter: Payer: Self-pay | Admitting: Gastroenterology

## 2019-03-23 ENCOUNTER — Other Ambulatory Visit: Payer: Self-pay | Admitting: Internal Medicine

## 2019-03-23 DIAGNOSIS — E034 Atrophy of thyroid (acquired): Secondary | ICD-10-CM

## 2019-03-24 ENCOUNTER — Encounter: Payer: Self-pay | Admitting: Gastroenterology

## 2019-03-30 ENCOUNTER — Telehealth: Payer: Self-pay | Admitting: Internal Medicine

## 2019-03-30 ENCOUNTER — Ambulatory Visit (INDEPENDENT_AMBULATORY_CARE_PROVIDER_SITE_OTHER): Payer: Medicare HMO | Admitting: Internal Medicine

## 2019-03-30 ENCOUNTER — Other Ambulatory Visit: Payer: Self-pay

## 2019-03-30 ENCOUNTER — Encounter: Payer: Self-pay | Admitting: Internal Medicine

## 2019-03-30 VITALS — BP 160/71 | HR 71 | Ht 64.5 in | Wt 207.0 lb

## 2019-03-30 DIAGNOSIS — E782 Mixed hyperlipidemia: Secondary | ICD-10-CM

## 2019-03-30 DIAGNOSIS — E1121 Type 2 diabetes mellitus with diabetic nephropathy: Secondary | ICD-10-CM

## 2019-03-30 DIAGNOSIS — N183 Chronic kidney disease, stage 3 unspecified: Secondary | ICD-10-CM | POA: Insufficient documentation

## 2019-03-30 DIAGNOSIS — E034 Atrophy of thyroid (acquired): Secondary | ICD-10-CM

## 2019-03-30 DIAGNOSIS — R944 Abnormal results of kidney function studies: Secondary | ICD-10-CM | POA: Diagnosis not present

## 2019-03-30 DIAGNOSIS — N1832 Chronic kidney disease, stage 3b: Secondary | ICD-10-CM | POA: Diagnosis not present

## 2019-03-30 DIAGNOSIS — R69 Illness, unspecified: Secondary | ICD-10-CM | POA: Diagnosis not present

## 2019-03-30 DIAGNOSIS — K760 Fatty (change of) liver, not elsewhere classified: Secondary | ICD-10-CM

## 2019-03-30 MED ORDER — SITAGLIPTIN PHOSPHATE 50 MG PO TABS: 50.0000 mg | ORAL_TABLET | Freq: Every day | ORAL | 5 refills | Status: DC

## 2019-03-30 NOTE — Progress Notes (Signed)
Telephone  Note  This visit type was conducted due to national recommendations for restrictions regarding the COVID-19 pandemic (e.g. social distancing).  This format is felt to be most appropriate for this patient at this time.  All issues noted in this document were discussed and addressed.  No physical exam was performed (except for noted visual exam findings with Video Visits).   I connected with@ on 03/30/19 at  9:00 AM EDT by  telephone and verified that I am speaking with the correct person using two identifiers. Location patient: home Location provider: work or home office Persons participating in the virtual visit: patient, provider  I discussed the limitations, risks, security and privacy concerns of performing an evaluation and management service by telephone and the availability of in person appointments. I also discussed with the patient that there may be a patient responsible charge related to this service. The patient expressed understanding and agreed to proceed.  Reason for visit: follow up  HPI:  78 yr old female with Type 2 DM, hypertensin,  And hyperlipidemia presents for follow up on above issues and new onset CKD.  3 month follow up on diabetes.  Patient has no complaints today.  Patient is not following a low glycemic index diet and has reduced morning dose of metformin to 425 mg due to recurrent loose stools. Morning sugars  Have been > 150 more than not. She has an evening snack of peanut butter/crackers or cookies.  Does not walk daily for exercise due to "laziness."    Patient has had an eye exam in the last 12 months and checks feet regularly for signs of infection.  Patient does not walk barefoot outside,  And denies any numbness tingling or burning in feet. Patient is up to date on all recommended vaccinations  Reviewed recent colonoscopy report with patient:  5 yr follow up for adenomas recommended in 2025   HTN Patient is taking her medications as prescribed  (telmisartan and Toprol XL both in the evening)  and notes no adverse effects.  Home BP readings have been done about once per week and are  generally < 130/80 .  She is avoiding added salt in her diet and walking infrequently for exercise  .  Decreased GFR:  Denies any recent use of NSAIDs.  Does not get thirsty so water intake estimated at 30 ounces or less daily.    ROS: Patient denies headache, fevers, malaise, unintentional weight loss, skin rash, eye pain, sinus congestion and sinus pain, sore throat, dysphagia,  hemoptysis , cough, dyspnea, wheezing, chest pain, palpitations, orthopnea, edema, abdominal pain, nausea, melena, diarrhea, constipation, flank pain, dysuria, hematuria, urinary  Frequency, nocturia, numbness, tingling, seizures,  Focal weakness, Loss of consciousness,  Tremor, insomnia, depression, anxiety, and suicidal ideation.      Past Medical History:  Diagnosis Date  . Diabetes mellitus without complication ([place])   . History of MRI of brain and brain stem May 2012   normal MRI/MRA done to rule out CVA  . Hyperlipidemia   . Hypertension   . Hypothyroidism   . Tubular adenoma of colon Nov 2010   repeat due 2015    Past Surgical History:  Procedure Laterality Date  . ABDOMINAL HYSTERECTOMY  1975   TAH/LSO, wedge resection of right  . APPENDECTOMY  1975   incidental, done during TAH  . COLONOSCOPY WITH PROPOFOL N/A 03/19/2019   Procedure: COLONOSCOPY WITH BIOPSIES;  Surgeon: Lucilla Lame, MD;  Location: [place];  Service: Endoscopy;  Laterality: N/A;  Diabetic  . POLYPECTOMY N/A 03/19/2019   Procedure: POLYPECTOMY;  Surgeon: Lucilla Lame, MD;  Location: [place];  Service: Endoscopy;  Laterality: N/A;    Family History  Problem Relation Age of Onset  . Cancer Father        Lung   . Breast cancer Sister 13    SOCIAL HX:  reports that she has never smoked. She has never used smokeless tobacco. She reports that she does not drink alcohol or  use drugs.   Current Outpatient Medications:  .  aspirin EC 81 MG tablet, Take 81 mg by mouth as needed., Disp: , Rfl:  .  blood glucose meter kit and supplies, Dispense based on patient and insurance preference. Use to check blood sugars twice daily. (FOR ICD-10 E11.69)., Disp: 1 each, Rfl: 0 .  glucose blood test strip, Use to check blood sugars up to twice daily., Disp: 100 each, Rfl: 2 .  Lancets (ONETOUCH DELICA PLUS [id number]) MISC, USE TO TEST BLOOD SUGAR TWICE DAILY, Disp: , Rfl:  .  levothyroxine (SYNTHROID) 100 MCG tablet, TAKE 1 TABLET BY MOUTH ONCE DAILY BEFORE BREAKFAST, Disp: 90 tablet, Rfl: 0 .  metFORMIN (GLUCOPHAGE) 850 MG tablet, TAKE 1 TABLET BY MOUTH TWICE DAILY WITH MEALS. OFFICE VISIT NEEDED PRIOR TO ANY MORE REFILLS., Disp: 180 tablet, Rfl: 1 .  metoprolol succinate (TOPROL-XL) 25 MG 24 hr tablet, Take 1 tablet (25 mg total) by mouth at bedtime., Disp: 90 tablet, Rfl: 3 .  Omega-3 Fatty Acids (FISH OIL) 1000 MG CAPS, Take by mouth as needed., Disp: , Rfl:  .  simvastatin (ZOCOR) 40 MG tablet, Take 1 tablet by mouth once daily, Disp: 90 tablet, Rfl: 1 .  telmisartan (MICARDIS) 80 MG tablet, Take 1 tablet by mouth once daily, Disp: 90 tablet, Rfl: 1 .  sitaGLIPtin (JANUVIA) 50 MG tablet, Take 1 tablet (50 mg total) by mouth daily., Disp: 30 tablet, Rfl: 5  EXAM:   General impression: alert, cooperative and articulate.  No signs of being in distress  Lungs: speech is fluent sentence length suggests that patient is not short of breath and not punctuated by cough, sneezing or sniffing. Marland Kitchen   Psych: affect normal.  speech is articulate and non pressured .  Denies suicidal thoughts   ASSESSMENT AND PLAN:  Discussed the following assessment and plan:  Decreased GFR - Plan: Basic metabolic panel  Mixed hyperlipidemia  Type II diabetes mellitus with nephropathy (HCC)  Stage 3b chronic kidney disease  NAFLD (nonalcoholic fatty liver disease)  Hyperlipidemia She is  tolerating statin  For management of HLD and fatty liver and has relatively normal  enzymes.   Lab Results  Component Value Date   CHOL 147 02/19/2019   HDL 34.50 (L) 02/19/2019   LDLCALC 73 02/19/2019   LDLDIRECT 113.0 09/22/2015   TRIG 194.0 (H) 02/19/2019   CHOLHDL 4 02/19/2019   Lab Results  Component Value Date   ALT 23 02/19/2019   AST 25 02/19/2019   ALKPHOS 48 02/19/2019   BILITOT 0.4 02/19/2019        Type II diabetes mellitus with nephropathy ([place]) Advised to continue reduced morning dose of metformin (1/2 tablet) .  Adding januvia for improved control.  If not covered by  [employer] will consider glipizide with meals. Follow up 3 months .  Dietary modifications discussed. Continue telmisartan for proteinuria  Lab Results  Component Value Date   HGBA1C 7.7 (H) 02/19/2019     Chronic kidney  disease (CKD), stage III (moderate) Suspected,  Given no change since January.  likely secondary to type 2 DM,  But will increase hydration and repeat BMET in one week.  NAFLD (nonalcoholic fatty liver disease) With esophageal varices noted on prior CT.  Repeat ultrasound Sept 2020 did not suggest cirrhosis and AFT tumor marker was normal/negative .  Continue statin and metformin .  Limit tylenol use to 1000 mg daily prn (not using at all currently,  Does not use nsaids either )    I discussed the assessment and treatment plan with the patient. The patient was provided an opportunity to ask questions and all were answered. The patient agreed with the plan and demonstrated an understanding of the instructions.   The patient was advised to call back or seek an in-person evaluation if the symptoms worsen or if the condition fails to improve as anticipated.  I provided  22 minutes of non-face-to-face time during this encounter reviewing patient's current problems and post surgeries.  Providing counseling on the above mentioned problems , and coordination  of care .   Crecencio Mc, MD

## 2019-03-30 NOTE — Assessment & Plan Note (Signed)
She is tolerating statin  For management of HLD and fatty liver and has relatively normal  enzymes.   Lab Results  Component Value Date   CHOL 147 02/19/2019   HDL 34.50 (L) 02/19/2019   LDLCALC 73 02/19/2019   LDLDIRECT 113.0 09/22/2015   TRIG 194.0 (H) 02/19/2019   CHOLHDL 4 02/19/2019   Lab Results  Component Value Date   ALT 23 02/19/2019   AST 25 02/19/2019   ALKPHOS 48 02/19/2019   BILITOT 0.4 02/19/2019

## 2019-03-30 NOTE — Telephone Encounter (Signed)
Lm on vm to call office to make the following appointments; non-fasting lab in 1 week, 3 month follow up and do labs 1 to 2 days before 43m follow up. Ok to set up as a virtual if needed.

## 2019-03-30 NOTE — Assessment & Plan Note (Addendum)
Advised to continue reduced morning dose of metformin (1/2 tablet) .  Adding januvia for improved control.  If not covered by  [employer] will consider glipizide with meals. Follow up 3 months .  Dietary modifications discussed. Continue telmisartan for proteinuria  Lab Results  Component Value Date   HGBA1C 7.7 (H) 02/19/2019

## 2019-03-30 NOTE — Assessment & Plan Note (Signed)
Suspected,  Given no change since January.  likely secondary to type 2 DM,  But will increase hydration and repeat BMET in one week.

## 2019-03-30 NOTE — Assessment & Plan Note (Addendum)
With esophageal varices noted on prior CT.  Repeat ultrasound Sept 2020 did not suggest cirrhosis and AFT tumor marker was normal/negative .  Continue statin and metformin .  Limit tylenol use to 1000 mg daily prn (not using at all currently,  Does not use nsaids either )

## 2019-04-06 ENCOUNTER — Other Ambulatory Visit (INDEPENDENT_AMBULATORY_CARE_PROVIDER_SITE_OTHER): Payer: Medicare HMO

## 2019-04-06 ENCOUNTER — Other Ambulatory Visit: Payer: Self-pay

## 2019-04-06 DIAGNOSIS — E782 Mixed hyperlipidemia: Secondary | ICD-10-CM | POA: Diagnosis not present

## 2019-04-06 DIAGNOSIS — E1121 Type 2 diabetes mellitus with diabetic nephropathy: Secondary | ICD-10-CM

## 2019-04-06 DIAGNOSIS — R944 Abnormal results of kidney function studies: Secondary | ICD-10-CM

## 2019-04-06 DIAGNOSIS — E034 Atrophy of thyroid (acquired): Secondary | ICD-10-CM | POA: Diagnosis not present

## 2019-04-06 LAB — COMPREHENSIVE METABOLIC PANEL WITH GFR
ALT: 21 U/L (ref 0–35)
AST: 21 U/L (ref 0–37)
Albumin: 4.3 g/dL (ref 3.5–5.2)
Alkaline Phosphatase: 46 U/L (ref 39–117)
BUN: 21 mg/dL (ref 6–23)
CO2: 27 meq/L (ref 19–32)
Calcium: 9.6 mg/dL (ref 8.4–10.5)
Chloride: 100 meq/L (ref 96–112)
Creatinine, Ser: 1.06 mg/dL (ref 0.40–1.20)
GFR: 50.23 mL/min — ABNORMAL LOW
Glucose, Bld: 128 mg/dL — ABNORMAL HIGH (ref 70–99)
Potassium: 4.5 meq/L (ref 3.5–5.1)
Sodium: 137 meq/L (ref 135–145)
Total Bilirubin: 0.6 mg/dL (ref 0.2–1.2)
Total Protein: 7.2 g/dL (ref 6.0–8.3)

## 2019-04-06 LAB — LIPID PANEL
Cholesterol: 146 mg/dL (ref 0–200)
HDL: 36.5 mg/dL — ABNORMAL LOW
LDL Cholesterol: 78 mg/dL (ref 0–99)
NonHDL: 109.85
Total CHOL/HDL Ratio: 4
Triglycerides: 158 mg/dL — ABNORMAL HIGH (ref 0.0–149.0)
VLDL: 31.6 mg/dL (ref 0.0–40.0)

## 2019-04-06 LAB — HEMOGLOBIN A1C: Hgb A1c MFr Bld: 7.9 % — ABNORMAL HIGH (ref 4.6–6.5)

## 2019-04-06 LAB — TSH: TSH: 2.18 u[IU]/mL (ref 0.35–4.50)

## 2019-06-18 ENCOUNTER — Telehealth: Payer: Self-pay | Admitting: *Deleted

## 2019-06-18 DIAGNOSIS — E1121 Type 2 diabetes mellitus with diabetic nephropathy: Secondary | ICD-10-CM

## 2019-06-18 NOTE — Telephone Encounter (Signed)
Pt has lab appt on 12/31. Per Dr. Derrel Nip it needs to be scheduled further out. Please contact patient to reschedule.See note below.  Thanks

## 2019-06-18 NOTE — Telephone Encounter (Signed)
Thanks .  Ordered, Non fasting .  Labs Due on or after jan 12

## 2019-06-18 NOTE — Telephone Encounter (Signed)
Please place future orders for lab appt.  

## 2019-06-23 NOTE — Telephone Encounter (Signed)
Spoke with pt and rescheduled her appt.

## 2019-06-25 ENCOUNTER — Other Ambulatory Visit: Payer: Medicare HMO

## 2019-06-25 ENCOUNTER — Telehealth: Payer: Self-pay | Admitting: Internal Medicine

## 2019-06-25 DIAGNOSIS — E034 Atrophy of thyroid (acquired): Secondary | ICD-10-CM

## 2019-06-29 NOTE — Telephone Encounter (Signed)
Pt cad about needing a refill for medication of levothyroxine (SYNTHROID) 100 MCG tablet pt is out of the medication.  Pharmacy is [place], [place] - [address]  Call pt @ [phone]

## 2019-06-29 NOTE — Telephone Encounter (Signed)
Spoke with pt to let her know that I spoke with [facility] about her levothyroxine and they stated that they needed permission to change the manufacturer due to the one she is currently taking is on backorder. Permission was was and they are going to fill the medication for pt. Called pt to let her know.

## 2019-07-01 ENCOUNTER — Ambulatory Visit: Payer: Medicare HMO | Admitting: Internal Medicine

## 2019-07-03 ENCOUNTER — Ambulatory Visit: Payer: Medicare HMO | Attending: Internal Medicine

## 2019-07-03 ENCOUNTER — Other Ambulatory Visit: Payer: Self-pay

## 2019-07-03 DIAGNOSIS — Z20822 Contact with and (suspected) exposure to covid-19: Secondary | ICD-10-CM | POA: Diagnosis not present

## 2019-07-03 DIAGNOSIS — E034 Atrophy of thyroid (acquired): Secondary | ICD-10-CM

## 2019-07-03 MED ORDER — LEVOTHYROXINE SODIUM 100 MCG PO TABS: ORAL_TABLET | ORAL | 0 refills | Status: DC

## 2019-07-04 LAB — NOVEL CORONAVIRUS, NAA: SARS-CoV-2, NAA: DETECTED — AB

## 2019-07-05 ENCOUNTER — Telehealth: Payer: Self-pay | Admitting: Nurse Practitioner

## 2019-07-05 NOTE — Telephone Encounter (Signed)
Called to discuss with patient about Covid symptoms and the use of bamlanivimab, a monoclonal antibody infusion for those with mild to moderate Covid symptoms and at a high risk of hospitalization.  Pt is qualified for this infusion at the [facility] due to Age > 65   Message left to call back  

## 2019-07-06 ENCOUNTER — Other Ambulatory Visit: Payer: Self-pay | Admitting: Nurse Practitioner

## 2019-07-06 ENCOUNTER — Telehealth: Payer: Self-pay | Admitting: Nurse Practitioner

## 2019-07-06 DIAGNOSIS — J22 Unspecified acute lower respiratory infection: Secondary | ICD-10-CM | POA: Insufficient documentation

## 2019-07-06 DIAGNOSIS — U071 COVID-19: Secondary | ICD-10-CM

## 2019-07-06 NOTE — Telephone Encounter (Signed)
  I connected by phone with Marissa Rodriguez on 07/06/2019 at 12:50 PM to discuss the potential use of an new treatment for mild to moderate COVID-19 viral infection in non-hospitalized patients.  Symptoms started with cough, fever, weakness on 07/01/2019 and tested positive 07/03/2019.  This patient is a 79 y.o. female that meets the FDA criteria for Emergency Use Authorization of bamlanivimab or casirivimab\imdevimab.  Has a (+) direct SARS-CoV-2 viral test result  Has mild or moderate COVID-19   Is ? 79 years of age and weighs ? 40 kg  Is NOT hospitalized due to COVID-19  Is NOT requiring oxygen therapy or requiring an increase in baseline oxygen flow rate due to COVID-19  Is within 10 days of symptom onset  Has at least one of the high risk factor(s) for progression to severe COVID-19 and/or hospitalization as defined in EUA.  Specific high risk criteria : >/= 78 yo   I have spoken and communicated the following to the patient or parent/caregiver:  1. FDA has authorized the emergency use of bamlanivimab and casirivimab\imdevimab for the treatment of mild to moderate COVID-19 in adults and pediatric patients with positive results of direct SARS-CoV-2 viral testing who are 71 years of age and older weighing at least 40 kg, and who are at high risk for progressing to severe COVID-19 and/or hospitalization.  2. The significant known and potential risks and benefits of bamlanivimab and casirivimab\imdevimab, and the extent to which such potential risks and benefits are unknown.  3. Information on available alternative treatments and the risks and benefits of those alternatives, including clinical trials.  4. Patients treated with bamlanivimab and casirivimab\imdevimab should continue to self-isolate and use infection control measures (e.g., wear mask, isolate, social distance, avoid sharing personal items, clean and disinfect "high touch" surfaces, and frequent handwashing) according to CDC  guidelines.   5. The patient or parent/caregiver has the option to accept or refuse bamlanivimab or casirivimab\imdevimab .  After reviewing this information with the patient, The patient agreed to proceed with receiving the bamlanimivab infusion and will be provided a copy of the Fact sheet prior to receiving the infusion.Venita Lick 07/06/2019 12:50 PM

## 2019-07-06 NOTE — Telephone Encounter (Signed)
She has not called me back.  If she has had symptoms less than 10 days, she would qualify.  If she calls your office let me know and I can try to reach out to her.  I have had some patients not answer calls due to it not being from their primary care office number.  Thank you all.

## 2019-07-06 NOTE — Progress Notes (Signed)
BAM orders

## 2019-07-06 NOTE — Telephone Encounter (Signed)
Was able to reach patient and she has cough with fatigue , No SOB " I think I am doing ok". Advised we were trying to reach her for the monoclonial infusion to please answer the phone she stated she had not answered due to number unrecognized please call patient back she is interested in the infusion therapy. I have forwarded this message FYI to PCP and to NP for the infusion center.

## 2019-07-06 NOTE — Telephone Encounter (Signed)
Thank you all so much, I figured she did not answer due to number.  She is now scheduled for monoclonal antibody infusion on Wednesday.  She did qualify as reports symptoms really started last Wednesday.

## 2019-07-08 ENCOUNTER — Ambulatory Visit (HOSPITAL_COMMUNITY)
Admission: RE | Admit: 2019-07-08 | Discharge: 2019-07-08 | Disposition: A | Discharge status: Home / Self Care | Payer: Medicare Other | Source: Ambulatory Visit | Attending: Pulmonary Disease | Admitting: Pulmonary Disease

## 2019-07-08 DIAGNOSIS — U071 COVID-19: Secondary | ICD-10-CM | POA: Insufficient documentation

## 2019-07-08 DIAGNOSIS — Z23 Encounter for immunization: Secondary | ICD-10-CM | POA: Insufficient documentation

## 2019-07-08 MED ORDER — DIPHENHYDRAMINE HCL 50 MG/ML IJ SOLN: 50.0000 mg | Freq: Once | INTRAMUSCULAR | Status: DC | PRN

## 2019-07-08 MED ORDER — SODIUM CHLORIDE 0.9 % IV SOLN
700.0000 mg | Freq: Once | INTRAVENOUS | Status: AC
  Administered 2019-07-08: 700 mg via INTRAVENOUS
  Filled 2019-07-08: qty 20

## 2019-07-08 MED ORDER — METHYLPREDNISOLONE SODIUM SUCC 125 MG IJ SOLR: 125.0000 mg | Freq: Once | INTRAMUSCULAR | Status: DC | PRN

## 2019-07-08 MED ORDER — FAMOTIDINE IN NACL 20-0.9 MG/50ML-% IV SOLN: 20.0000 mg | Freq: Once | INTRAVENOUS | Status: DC | PRN

## 2019-07-08 MED ORDER — EPINEPHRINE 0.3 MG/0.3ML IJ SOAJ: 0.3000 mg | Freq: Once | INTRAMUSCULAR | Status: DC | PRN

## 2019-07-08 MED ORDER — ALBUTEROL SULFATE HFA 108 (90 BASE) MCG/ACT IN AERS: 2.0000 | INHALATION_SPRAY | Freq: Once | RESPIRATORY_TRACT | Status: DC | PRN

## 2019-07-08 MED ORDER — SODIUM CHLORIDE 0.9 % IV SOLN
INTRAVENOUS | Status: DC | PRN
  Administered 2019-07-08: 250 mL via INTRAVENOUS

## 2019-07-08 NOTE — Progress Notes (Signed)
  Diagnosis: COVID-19  Physician: Dr. Deborra Medina  Procedure: [facility] Med: bamlanivimab infusion - Provided patient with bamlanimivab fact sheet for patients, parents and caregivers prior to infusion.  Complications: No immediate complications noted.  Discharge: Discharged home   Rikki Trosper L 07/08/2019

## 2019-07-08 NOTE — Discharge Instructions (Signed)
10 Things You Can Do to Manage Your COVID-19 Symptoms at Home If you have possible or confirmed COVID-19: 1. Stay home from work and school. And stay away from other public places. If you must go out, avoid using any kind of public transportation, ridesharing, or taxis. 2. Monitor your symptoms carefully. If your symptoms get worse, call your healthcare provider immediately. 3. Get rest and stay hydrated. 4. If you have a medical appointment, call the healthcare provider ahead of time and tell them that you have or may have COVID-19. 5. For medical emergencies, call 911 and notify the dispatch personnel that you have or may have COVID-19. 6. Cover your cough and sneezes with a tissue or use the inside of your elbow. 7. Wash your hands often with soap and water for at least 20 seconds or clean your hands with an alcohol-based hand sanitizer that contains at least 60% alcohol. 8. As much as possible, stay in a specific room and away from other people in your home. Also, you should use a separate bathroom, if available. If you need to be around other people in or outside of the home, wear a mask. 9. Avoid sharing personal items with other people in your household, like dishes, towels, and bedding. 10. Clean all surfaces that are touched often, like counters, tabletops, and doorknobs. Use household cleaning sprays or wipes according to the label instructions. cdc.gov/coronavirus 12/24/2018 This information is not intended to replace advice given to you by your health care provider. Make sure you discuss any questions you have with your health care provider. Document Revised: 05/28/2019 Document Reviewed: 05/28/2019 Elsevier Patient Education  2020 Elsevier Inc.  

## 2019-07-09 ENCOUNTER — Other Ambulatory Visit: Payer: Medicare HMO

## 2019-07-13 ENCOUNTER — Ambulatory Visit: Payer: Medicare HMO | Admitting: Internal Medicine

## 2019-07-14 ENCOUNTER — Telehealth: Payer: Self-pay | Admitting: Internal Medicine

## 2019-07-14 NOTE — Telephone Encounter (Signed)
Pt was diagnosed with Covid on 06/13/2019. Pt states that her cough will not go away. Pt would like something called in. Pt refused appt. Please advise.

## 2019-07-14 NOTE — Telephone Encounter (Signed)
Spoke with pt and she has been scheduled for Friday at 4:30.

## 2019-07-14 NOTE — Telephone Encounter (Signed)
I will not treat without a telephone or virtual visit

## 2019-07-17 ENCOUNTER — Encounter: Payer: Self-pay | Admitting: Internal Medicine

## 2019-07-17 ENCOUNTER — Ambulatory Visit (INDEPENDENT_AMBULATORY_CARE_PROVIDER_SITE_OTHER): Payer: Medicare HMO | Admitting: Internal Medicine

## 2019-07-17 ENCOUNTER — Other Ambulatory Visit: Payer: Self-pay

## 2019-07-17 DIAGNOSIS — U071 COVID-19: Secondary | ICD-10-CM

## 2019-07-17 DIAGNOSIS — J22 Unspecified acute lower respiratory infection: Secondary | ICD-10-CM | POA: Diagnosis not present

## 2019-07-17 MED ORDER — AMOXICILLIN-POT CLAVULANATE 875-125 MG PO TABS: 1.0000 | ORAL_TABLET | Freq: Two times a day (BID) | ORAL | 0 refills | Status: DC

## 2019-07-17 MED ORDER — CHERATUSSIN AC 100-10 MG/5ML PO SOLN: 5.0000 mL | Freq: Three times a day (TID) | ORAL | 0 refills | Status: DC | PRN

## 2019-07-17 MED ORDER — PREDNISONE 10 MG PO TABS: ORAL_TABLET | ORAL | 0 refills | Status: DC

## 2019-07-17 NOTE — Progress Notes (Signed)
Virtual Visit converted to telephone visit   This visit type was conducted due to national recommendations for restrictions regarding the COVID-19 pandemic (e.g. social distancing).  This format is felt to be most appropriate for this patient at this time.  All issues noted in this document were discussed and addressed.  No physical exam was performed (except for noted visual exam findings with Video Visits).   I attempted to  connect with@ on 07/17/19 at  4:30 PM EST by a video enabled telemedicine application  and verified that I am speaking with the correct person using two identifiers. Interactive audio and video telecommunications were initially established beteen this provider and patient, however ultimately failed, due to patient having technical difficulties. We continued and completed visit with audio only  and verified that I am speaking with the correct person using two identifiers  Location patient: home Location provider: work or home office Persons participating in the virtual visit: patient, provider  I discussed the limitations, risks, security and privacy concerns of performing an evaluation and management service by telephone and the availability of in person appointments. I also discussed with the patient that there may be a patient responsible charge related to this service. The patient expressed understanding and agreed to proceed.   Reason for visit: COVID 85 INFECTION  HPI:  79 yr old female with type 2 DM , HTN Diagnosed on jan 9th    Sent to [facility] for the antibody infusion for COVID 19 on Jan 13.  Marland Kitchen Feeling much better but not hungry and  feels very weak . Cough has not improved with mucinex and currently starting to have a productive cough  . Sputum is clear. Checking pulse ox tid,  sats 94 to 96 .  Drops to 90 for a few minutes with activity No fevers but taking tylenol, last dose .  No chest tightness  Or wheezing      Past Medical History:  Diagnosis Date  .  Diabetes mellitus without complication ([place])   . History of MRI of brain and brain stem May 2012   normal MRI/MRA done to rule out CVA  . Hyperlipidemia   . Hypertension   . Hypothyroidism   . Tubular adenoma of colon Nov 2010   repeat due 2015    Past Surgical History:  Procedure Laterality Date  . ABDOMINAL HYSTERECTOMY  1975   TAH/LSO, wedge resection of right  . APPENDECTOMY  1975   incidental, done during TAH  . COLONOSCOPY WITH PROPOFOL N/A 03/19/2019   Procedure: COLONOSCOPY WITH BIOPSIES;  Surgeon: Lucilla Lame, MD;  Location: [facility];  Service: Endoscopy;  Laterality: N/A;  Diabetic  . POLYPECTOMY N/A 03/19/2019   Procedure: POLYPECTOMY;  Surgeon: Lucilla Lame, MD;  Location: [facility];  Service: Endoscopy;  Laterality: N/A;    Family History  Problem Relation Age of Onset  . Cancer Father        Lung   . Breast cancer Sister 44    SOCIAL HX:  reports that she has never smoked. She has never used smokeless tobacco. She reports that she does not drink alcohol or use drugs.   Current Outpatient Medications:  .  aspirin EC 81 MG tablet, Take 81 mg by mouth as needed., Disp: , Rfl:  .  blood glucose meter kit and supplies, Dispense based on patient and insurance preference. Use to check blood sugars twice daily. (FOR ICD-10 E11.69)., Disp: 1 each, Rfl: 0 .  glucose blood test strip,  Use to check blood sugars up to twice daily., Disp: 100 each, Rfl: 2 .  Lancets (ONETOUCH DELICA PLUS [id number]) MISC, USE TO TEST BLOOD SUGAR TWICE DAILY, Disp: , Rfl:  .  levothyroxine (SYNTHROID) 100 MCG tablet, TAKE 1 TABLET BY MOUTH ONCE DAILY BEFORE BREAKFAST, Disp: 90 tablet, Rfl: 0 .  metFORMIN (GLUCOPHAGE) 850 MG tablet, TAKE 1 TABLET BY MOUTH TWICE DAILY WITH MEALS. OFFICE VISIT NEEDED PRIOR TO ANY MORE REFILLS., Disp: 180 tablet, Rfl: 1 .  metoprolol succinate (TOPROL-XL) 25 MG 24 hr tablet, Take 1 tablet (25 mg total) by mouth at bedtime., Disp: 90 tablet, Rfl:  3 .  Omega-3 Fatty Acids (FISH OIL) 1000 MG CAPS, Take by mouth as needed., Disp: , Rfl:  .  simvastatin (ZOCOR) 40 MG tablet, Take 1 tablet by mouth once daily, Disp: 90 tablet, Rfl: 1 .  sitaGLIPtin (JANUVIA) 50 MG tablet, Take 1 tablet (50 mg total) by mouth daily., Disp: 30 tablet, Rfl: 5 .  telmisartan (MICARDIS) 80 MG tablet, Take 1 tablet by mouth once daily, Disp: 90 tablet, Rfl: 1 .  amoxicillin-clavulanate (AUGMENTIN) 875-125 MG tablet, Take 1 tablet by mouth 2 (two) times daily., Disp: 14 tablet, Rfl: 0 .  guaiFENesin-codeine (CHERATUSSIN AC) 100-10 MG/5ML syrup, Take 5 mLs by mouth 3 (three) times daily as needed for cough., Disp: 120 mL, Rfl: 0 .  predniSONE (DELTASONE) 10 MG tablet, 6 tablets daily for 3 days, then reduce by 1 tablet daily until gone, Disp: 33 tablet, Rfl: 0  EXAM:   General impression: alert, cooperative and articulate.  No signs of being in distress  Lungs: speech is fluent sentence length suggests that patient is not short of breath and not punctuated by cough, sneezing or sniffing. Marland Kitchen   Psych: affect normal.  speech is articulate and non pressured .  Denies suicidal thoughts   ASSESSMENT AND PLAN:  Discussed the following assessment and plan:  Lower respiratory tract infection due to COVID-19 virus  Lower respiratory tract infection due to COVID-19 virus Symptoms are mild but accompanied by mild hypoxia . Adding cough suppressant and prednisone taper.  Given the development of a productive cough,  Will ADD ANTIBIOTICS if sputum becomes discolored or she develops a fever      I discussed the assessment and treatment plan with the patient. The patient was provided an opportunity to ask questions and all were answered. The patient agreed with the plan and demonstrated an understanding of the instructions.   The patient was advised to call back or seek an in-person evaluation if the symptoms worsen or if the condition fails to improve as anticipated.  I  provided  25 minutes of non-face-to-face time during this encounter reviewing patient's current problems and past procedures/imaging studies, providing counseling on the above mentioned problems , and coordination  of care .  Crecencio Mc, MD

## 2019-07-19 NOTE — Assessment & Plan Note (Signed)
Symptoms are mild but accompanied by mild hypoxia . Adding cough suppressant and prednisone taper.  Given the development of a productive cough,  Will ADD ANTIBIOTICS if sputum becomes discolored or she develops a fever

## 2019-08-04 ENCOUNTER — Other Ambulatory Visit: Payer: Self-pay | Admitting: Internal Medicine

## 2019-09-01 ENCOUNTER — Other Ambulatory Visit: Payer: Self-pay | Admitting: Internal Medicine

## 2019-09-01 DIAGNOSIS — E782 Mixed hyperlipidemia: Secondary | ICD-10-CM

## 2019-09-08 IMAGING — MG DIGITAL SCREENING BILATERAL MAMMOGRAM WITH TOMO AND CAD
8 series · 8 of 24 positions shown · non-contrast
Comparison: Previous exam(s).

CLINICAL DATA: Screening.

EXAM:
DIGITAL SCREENING BILATERAL MAMMOGRAM WITH TOMO AND CAD

[L MLO synth-2D]
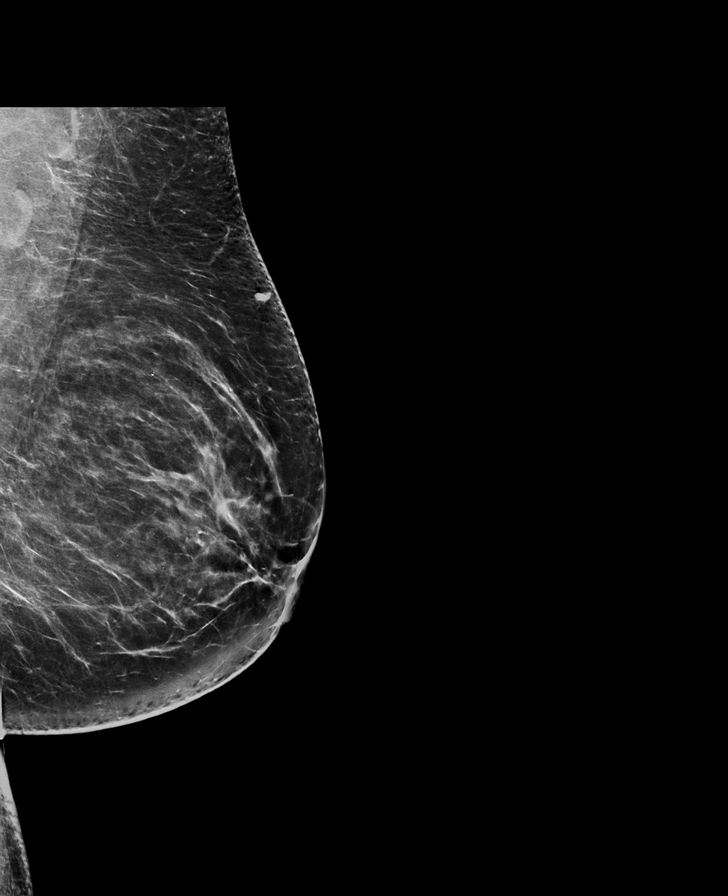

[R CC synth-2D]
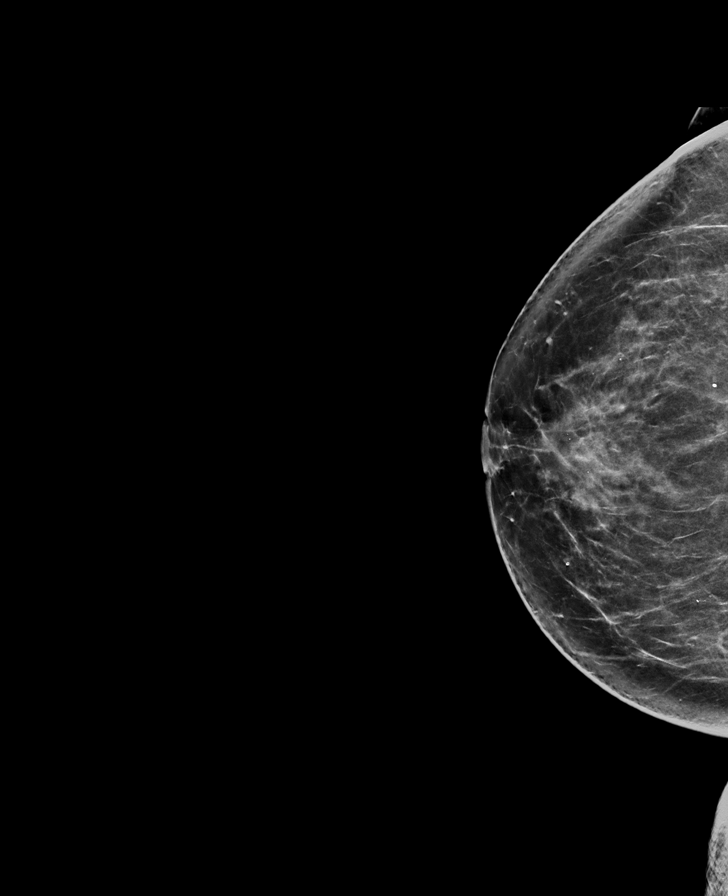

[L CC synth-2D]
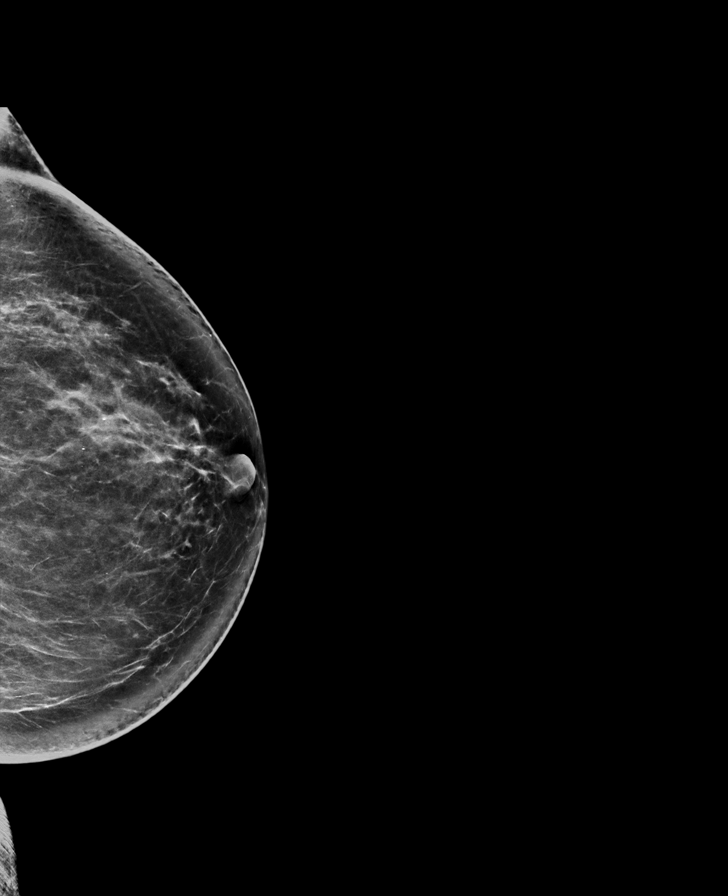

[R MLO synth-2D]
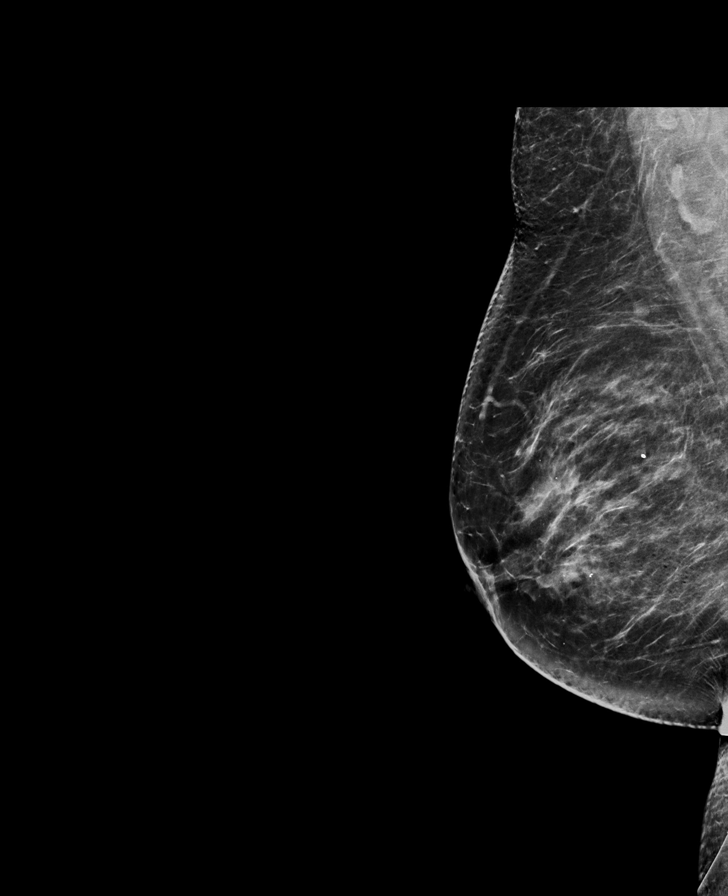

[R CC tomo · tomo slice 37/73.0]
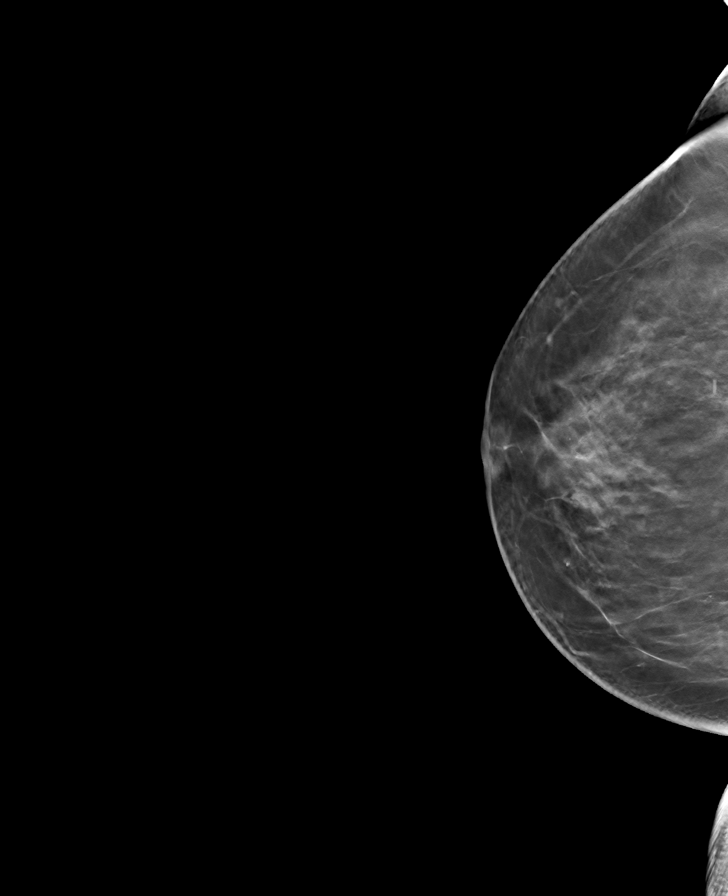

[L MLO tomo · tomo slice 43/85.0]
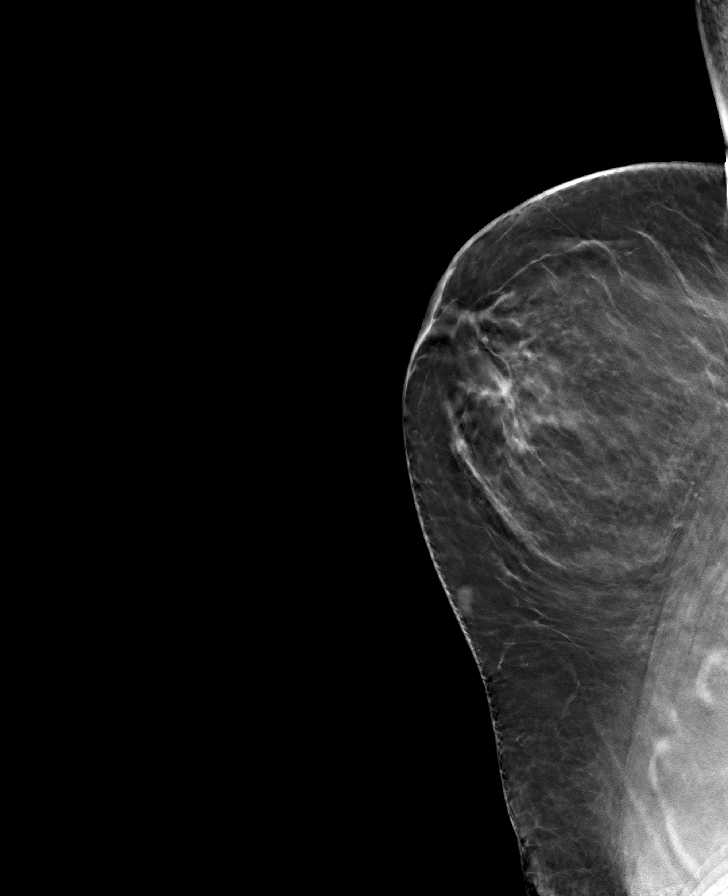

[L CC tomo · tomo slice 41/82.0]
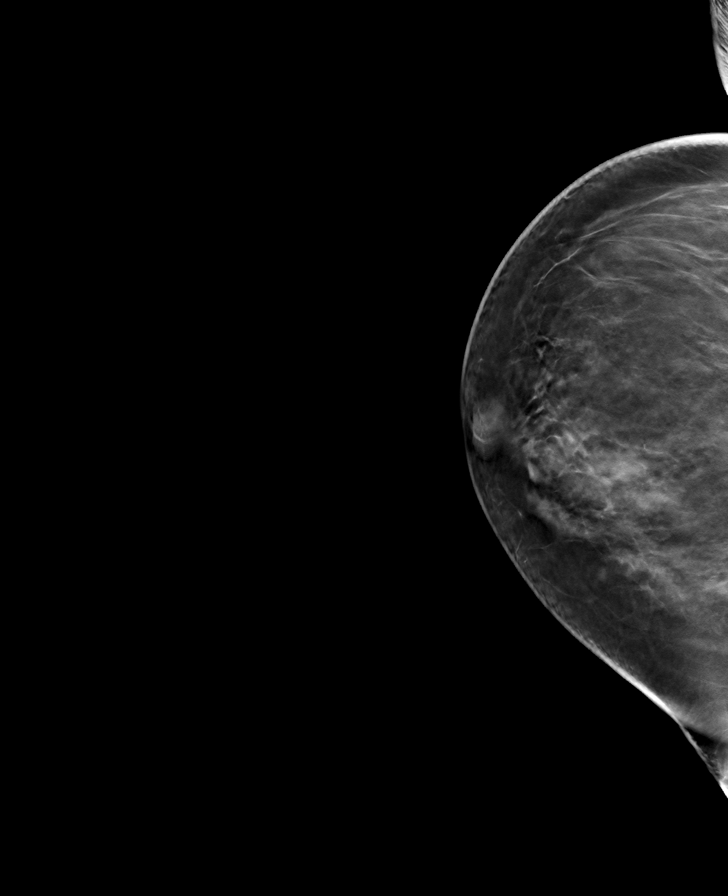

[R MLO tomo · tomo slice 41/81.0]
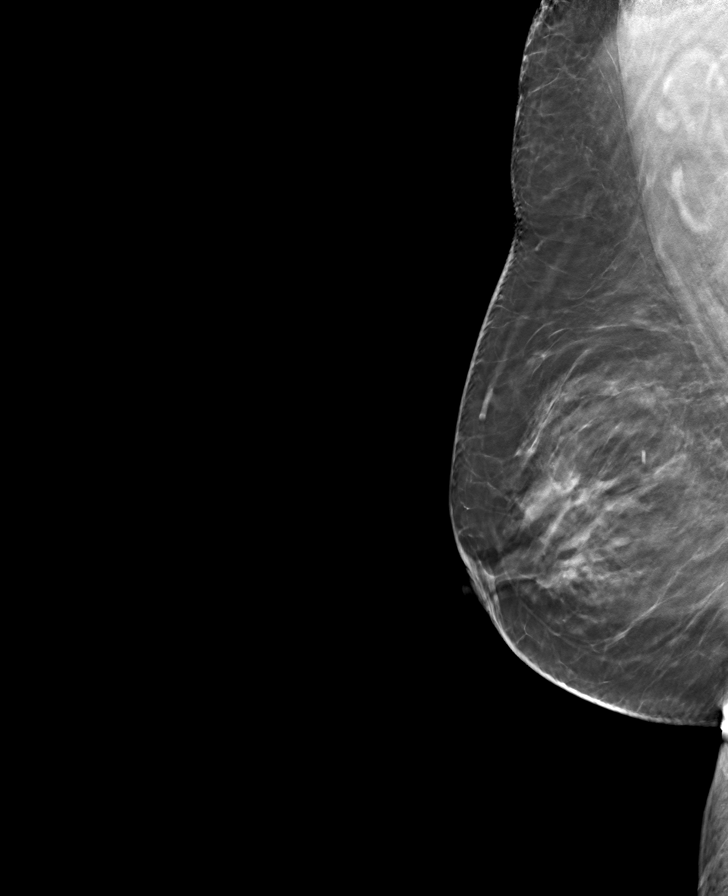

[8 of 24 positions shown; findings below may reference images not displayed]

ACR Breast Density Category c: The breast tissue is heterogeneously
dense, which may obscure small masses.
FINDINGS: There are no findings suspicious for malignancy. Images were
processed with CAD.
IMPRESSION: No mammographic evidence of malignancy. A result letter of this
screening mammogram will be mailed directly to the patient.

RECOMMENDATION:
Screening mammogram in one year. (Code:[id number])

BI-RADS CATEGORY  1: Negative.

## 2019-11-03 ENCOUNTER — Other Ambulatory Visit: Payer: Self-pay | Admitting: Internal Medicine

## 2019-11-15 ENCOUNTER — Other Ambulatory Visit: Payer: Self-pay | Admitting: Internal Medicine

## 2019-11-15 DIAGNOSIS — E782 Mixed hyperlipidemia: Secondary | ICD-10-CM

## 2019-11-26 ENCOUNTER — Other Ambulatory Visit: Payer: Self-pay | Admitting: Internal Medicine

## 2019-11-26 DIAGNOSIS — R69 Illness, unspecified: Secondary | ICD-10-CM | POA: Diagnosis not present

## 2019-12-31 DIAGNOSIS — E113393 Type 2 diabetes mellitus with moderate nonproliferative diabetic retinopathy without macular edema, bilateral: Secondary | ICD-10-CM | POA: Diagnosis not present

## 2019-12-31 DIAGNOSIS — H524 Presbyopia: Secondary | ICD-10-CM | POA: Diagnosis not present

## 2020-01-08 ENCOUNTER — Other Ambulatory Visit: Payer: Self-pay | Admitting: Internal Medicine

## 2020-01-08 DIAGNOSIS — E034 Atrophy of thyroid (acquired): Secondary | ICD-10-CM

## 2020-01-31 ENCOUNTER — Other Ambulatory Visit: Payer: Self-pay | Admitting: Internal Medicine

## 2020-02-01 ENCOUNTER — Other Ambulatory Visit: Payer: Self-pay | Admitting: Internal Medicine

## 2020-02-01 DIAGNOSIS — Z1231 Encounter for screening mammogram for malignant neoplasm of breast: Secondary | ICD-10-CM

## 2020-02-03 ENCOUNTER — Other Ambulatory Visit: Payer: Self-pay | Admitting: Internal Medicine

## 2020-02-23 ENCOUNTER — Other Ambulatory Visit: Payer: Self-pay

## 2020-02-23 ENCOUNTER — Ambulatory Visit
Admission: RE | Admit: 2020-02-23 | Discharge: 2020-02-23 | Disposition: A | Discharge status: Home / Self Care | Payer: Medicare HMO | Source: Ambulatory Visit | Attending: Internal Medicine | Admitting: Internal Medicine

## 2020-02-23 DIAGNOSIS — Z1231 Encounter for screening mammogram for malignant neoplasm of breast: Secondary | ICD-10-CM | POA: Diagnosis present

## 2020-02-26 ENCOUNTER — Other Ambulatory Visit: Payer: Self-pay | Admitting: Internal Medicine

## 2020-02-26 DIAGNOSIS — E782 Mixed hyperlipidemia: Secondary | ICD-10-CM

## 2020-03-09 ENCOUNTER — Ambulatory Visit: Payer: Medicare HMO

## 2020-03-11 ENCOUNTER — Ambulatory Visit (INDEPENDENT_AMBULATORY_CARE_PROVIDER_SITE_OTHER): Payer: Medicare HMO

## 2020-03-11 VITALS — Ht 66.0 in | Wt 180.0 lb

## 2020-03-11 DIAGNOSIS — Z0000 Encounter for general adult medical examination without abnormal findings: Secondary | ICD-10-CM | POA: Diagnosis not present

## 2020-03-11 NOTE — Patient Instructions (Addendum)
Marissa Rodriguez , Thank you for taking time to come for your Medicare Wellness Visit. I appreciate your ongoing commitment to your health goals. Please review the following plan we discussed and let me know if I can assist you in the future.   These are the goals we discussed: Goals      Patient Stated   .  Healthy diet (pt-stated)      Other   .  Increase physical activity      Aerobic exercise 150 minutes per week (5 days, 30 minutes)       This is a list of the screening recommended for you and due dates:  Health Maintenance  Topic Date Due  . Complete foot exam   01/23/2019  . Hemoglobin A1C  10/05/2019  . COVID-19 Vaccine (1) 03/27/2020*  . Flu Shot  09/22/2020*  . Eye exam for diabetics  12/30/2020  . Mammogram  02/22/2021  . Tetanus Vaccine  10/11/2022  . Colon Cancer Screening  03/18/2024  . DEXA scan (bone density measurement)  Completed  .  Hepatitis C: One time screening is recommended by Center for Disease Control  (CDC) for  adults born from [birth year] through 1964.   Completed  . Pneumonia vaccines  Completed  *Topic was postponed. The date shown is not the original due date.    Immunizations Immunization History  Administered Date(s) Administered  . Influenza, High Dose Seasonal PF 03/24/2015, 03/05/2018  . Influenza,inj,Quad PF,6+ Mos 07/01/2013  . Pneumococcal Conjugate-13 03/17/2014  . Pneumococcal Polysaccharide-23 07/02/2011  . Tdap 10/10/2012   Advanced directives: declined  Conditions/risks identified: none new  Follow up in one year for your annual wellness visit.   Preventive Care 37 Years and Older, Female Preventive care refers to lifestyle choices and visits with your health care provider that can promote health and wellness. What does preventive care include?  A yearly physical exam. This is also called an annual well check.  Dental exams once or twice a year.  Routine eye exams. Ask your health care provider how often you should have your  eyes checked.  Personal lifestyle choices, including:  Daily care of your teeth and gums.  Regular physical activity.  Eating a healthy diet.  Avoiding tobacco and drug use.  Limiting alcohol use.  Practicing safe sex.  Taking low-dose aspirin every day.  Taking vitamin and mineral supplements as recommended by your health care provider. What happens during an annual well check? The services and screenings done by your health care provider during your annual well check will depend on your age, overall health, lifestyle risk factors, and family history of disease. Counseling  Your health care provider may ask you questions about your:  Alcohol use.  Tobacco use.  Drug use.  Emotional well-being.  Home and relationship well-being.  Sexual activity.  Eating habits.  History of falls.  Memory and ability to understand (cognition).  Work and work Statistician.  Reproductive health. Screening  You may have the following tests or measurements:  Height, weight, and BMI.  Blood pressure.  Lipid and cholesterol levels. These may be checked every 5 years, or more frequently if you are over 57 years old.  Skin check.  Lung cancer screening. You may have this screening every year starting at age 35 if you have a 30-pack-year history of smoking and currently smoke or have quit within the past 15 years.  Fecal occult blood test (FOBT) of the stool. You may have this test every year starting  at age 31.  Flexible sigmoidoscopy or colonoscopy. You may have a sigmoidoscopy every 5 years or a colonoscopy every 10 years starting at age 38.  Hepatitis C blood test.  Hepatitis B blood test.  Sexually transmitted disease (STD) testing.  Diabetes screening. This is done by checking your blood sugar (glucose) after you have not eaten for a while (fasting). You may have this done every 1-3 years.  Bone density scan. This is done to screen for osteoporosis. You may have this  done starting at age 71.  Mammogram. This may be done every 1-2 years. Talk to your health care provider about how often you should have regular mammograms. Talk with your health care provider about your test results, treatment options, and if necessary, the need for more tests. Vaccines  Your health care provider may recommend certain vaccines, such as:  Influenza vaccine. This is recommended every year.  Tetanus, diphtheria, and acellular pertussis (Tdap, Td) vaccine. You may need a Td booster every 10 years.  Zoster vaccine. You may need this after age 57.  Pneumococcal 13-valent conjugate (PCV13) vaccine. One dose is recommended after age 43.  Pneumococcal polysaccharide (PPSV23) vaccine. One dose is recommended after age 85. Talk to your health care provider about which screenings and vaccines you need and how often you need them. This information is not intended to replace advice given to you by your health care provider. Make sure you discuss any questions you have with your health care provider. Document Released: 07/08/2015 Document Revised: 02/29/2016 Document Reviewed: 04/12/2015 Elsevier Interactive Patient Education  2017 [place] Prevention in the Home Falls can cause injuries. They can happen to people of all ages. There are many things you can do to make your home safe and to help prevent falls. What can I do on the outside of my home?  Regularly fix the edges of walkways and driveways and fix any cracks.  Remove anything that might make you trip as you walk through a door, such as a raised step or threshold.  Trim any bushes or trees on the path to your home.  Use bright outdoor lighting.  Clear any walking paths of anything that might make someone trip, such as rocks or tools.  Regularly check to see if handrails are loose or broken. Make sure that both sides of any steps have handrails.  Any raised decks and porches should have guardrails on the  edges.  Have any leaves, snow, or ice cleared regularly.  Use sand or salt on walking paths during winter.  Clean up any spills in your garage right away. This includes oil or grease spills. What can I do in the bathroom?  Use night lights.  Install grab bars by the toilet and in the tub and shower. Do not use towel bars as grab bars.  Use non-skid mats or decals in the tub or shower.  If you need to sit down in the shower, use a plastic, non-slip stool.  Keep the floor dry. Clean up any water that spills on the floor as soon as it happens.  Remove soap buildup in the tub or shower regularly.  Attach bath mats securely with double-sided non-slip rug tape.  Do not have throw rugs and other things on the floor that can make you trip. What can I do in the bedroom?  Use night lights.  Make sure that you have a light by your bed that is easy to reach.  Do not use  any sheets or blankets that are too big for your bed. They should not hang down onto the floor.  Have a firm chair that has side arms. You can use this for support while you get dressed.  Do not have throw rugs and other things on the floor that can make you trip. What can I do in the kitchen?  Clean up any spills right away.  Avoid walking on wet floors.  Keep items that you use a lot in easy-to-reach places.  If you need to reach something above you, use a strong step stool that has a grab bar.  Keep electrical cords out of the way.  Do not use floor polish or wax that makes floors slippery. If you must use wax, use non-skid floor wax.  Do not have throw rugs and other things on the floor that can make you trip. What can I do with my stairs?  Do not leave any items on the stairs.  Make sure that there are handrails on both sides of the stairs and use them. Fix handrails that are broken or loose. Make sure that handrails are as long as the stairways.  Check any carpeting to make sure that it is firmly  attached to the stairs. Fix any carpet that is loose or worn.  Avoid having throw rugs at the top or bottom of the stairs. If you do have throw rugs, attach them to the floor with carpet tape.  Make sure that you have a light switch at the top of the stairs and the bottom of the stairs. If you do not have them, ask someone to add them for you. What else can I do to help prevent falls?  Wear shoes that:  Do not have high heels.  Have rubber bottoms.  Are comfortable and fit you well.  Are closed at the toe. Do not wear sandals.  If you use a stepladder:  Make sure that it is fully opened. Do not climb a closed stepladder.  Make sure that both sides of the stepladder are locked into place.  Ask someone to hold it for you, if possible.  Clearly mark and make sure that you can see:  Any grab bars or handrails.  First and last steps.  Where the edge of each step is.  Use tools that help you move around (mobility aids) if they are needed. These include:  Canes.  Walkers.  Scooters.  Crutches.  Turn on the lights when you go into a dark area. Replace any light bulbs as soon as they burn out.  Set up your furniture so you have a clear path. Avoid moving your furniture around.  If any of your floors are uneven, fix them.  If there are any pets around you, be aware of where they are.  Review your medicines with your doctor. Some medicines can make you feel dizzy. This can increase your chance of falling. Ask your doctor what other things that you can do to help prevent falls. This information is not intended to replace advice given to you by your health care provider. Make sure you discuss any questions you have with your health care provider. Document Released: 04/07/2009 Document Revised: 11/17/2015 Document Reviewed: 07/16/2014 Elsevier Interactive Patient Education  2017 [employer].

## 2020-03-11 NOTE — Progress Notes (Addendum)
Subjective:   Marissa Rodriguez is a 79 y.o. female who presents for Medicare Annual (Subsequent) preventive examination.  Review of Systems    No ROS.  Medicare Wellness Virtual Visit.    Cardiac Risk Factors include: advanced age (>64mn, >>54women);diabetes mellitus;hypertension     Objective:    Today's Vitals   03/11/20 1038  Weight: 180 lb (81.6 kg)  Height: _0  (1.676 m)   Body mass index is 29.05 kg/m.  Advanced Directives 03/11/2020 03/19/2019 03/09/2019 03/05/2018 07/22/2015  Does Patient Have a Medical Advance Directive? _1   Would patient like information on creating a medical advance directive? No - Patient declined No - Patient declined No - Patient declined Yes (MAU/Ambulatory/Procedural Areas - Information given) No - patient declined information    Current Medications (verified) Outpatient Encounter Medications as of 03/11/2020  Medication Sig   amoxicillin-clavulanate (AUGMENTIN) 875-125 MG tablet Take 1 tablet by mouth 2 (two) times daily.   aspirin EC 81 MG tablet Take 81 mg by mouth as needed.   blood glucose meter kit and supplies Dispense based on patient and insurance preference. Use to check blood sugars twice daily. (FOR ICD-10 E11.69).   EUTHYROX 100 MCG tablet TAKE 1 TABLET BY MOUTH ONCE DAILY BEFORE BREAKFAST   guaiFENesin-codeine (CHERATUSSIN AC) 100-10 MG/5ML syrup Take 5 mLs by mouth 3 (three) times daily as needed for cough.   Lancets (ONETOUCH DELICA PLUS [id number] MISC USE TO TEST BLOOD SUGAR TWICE DAILY   metFORMIN (GLUCOPHAGE) 850 MG tablet TAKE 1 TABLET BY MOUTH TWICE DAILY WITH MEALS. OFFICE VISIT NEEDED PRIOR TO ANY MORE REFILLS.   metoprolol succinate (TOPROL-XL) 25 MG 24 hr tablet TAKE 1 TABLET BY MOUTH AT BEDTIME   Omega-3 Fatty Acids (FISH OIL) 1000 MG CAPS Take by mouth as needed.   ONETOUCH VERIO test strip USE 1 STRIP TO CHECK GLUCOSE UP TO TWICE DAILY   predniSONE (DELTASONE) 10 MG tablet 6 tablets daily for 3 days, then  reduce by 1 tablet daily until gone (Patient not taking: Reported on 03/11/2020)   simvastatin (ZOCOR) 40 MG tablet Take 1 tablet by mouth once daily   sitaGLIPtin (JANUVIA) 50 MG tablet Take 1 tablet (50 mg total) by mouth daily.   telmisartan (MICARDIS) 80 MG tablet Take 1 tablet by mouth once daily   No facility-administered encounter medications on file as of 03/11/2020.    Allergies (verified) Patient has no known allergies.   History: Past Medical History:  Diagnosis Date   Diabetes mellitus without complication ([place]    History of MRI of brain and brain stem May 2012   normal MRI/MRA done to rule out CVA   Hyperlipidemia    Hypertension    Hypothyroidism    Tubular adenoma of colon Nov 2010   repeat due 2015   Past Surgical History:  Procedure Laterality Date   ABDOMINAL HYSTERECTOMY  1975   TAH/LSO, wedge resection of right   APPENDECTOMY  1975   incidental, done during TAH   COLONOSCOPY WITH PROPOFOL N/A 03/19/2019   Procedure: COLONOSCOPY WITH BIOPSIES;  Surgeon: WLucilla Lame MD;  [facility]  Service: Endoscopy;  Laterality: N/A;  Diabetic   POLYPECTOMY N/A 03/19/2019   Procedure: POLYPECTOMY;  Surgeon: WLucilla Lame MD;  [facility]  Service: Endoscopy;  Laterality: N/A;   Family History  Problem Relation Age of Onset   Cancer Father        Lung    Breast  cancer Sister 12   Social History   Socioeconomic History   Marital status: Widowed    Spouse name: Not on file   Number of children: Not on file   Years of education: Not on file   Highest education level: Not on file  Occupational History   Not on file  Tobacco Use   Smoking status: Never Smoker   Smokeless tobacco: Never Used  Vaping Use   Vaping Use: Never used  Substance and Sexual Activity   Alcohol use: No   Drug use: No   Sexual activity: Not on file  Other Topics Concern   Not on file  Social History Narrative   Not on file   Social Determinants  of Health   Financial Resource Strain: Low Risk    Difficulty of Paying Living Expenses: Not hard at all  Food Insecurity: No Food Insecurity   Worried About [place] in the Last Year: Never true   [place] in the Last Year: Never true  Transportation Needs: No Transportation Needs   Lack of Transportation (Medical): No   Lack of Transportation (Non-Medical): No  Physical Activity:    Days of Exercise per Week: Not on file   Minutes of Exercise per Session: Not on file  Stress: No Stress Concern Present   Feeling of Stress : Not at all  Social Connections:    Frequency of Communication with Friends and Family: Not on file   Frequency of Social Gatherings with Friends and Family: Not on file   Attends Religious Services: Not on Electrical engineer or Organizations: Not on file   Attends Archivist Meetings: Not on file   Marital Status: Not on file    Tobacco Counseling Counseling given: Not Answered   Clinical Intake:  Pre-visit preparation completed: Yes        Diabetes: Yes (Followed by pcp)  How often do you need to have someone help you when you read instructions, pamphlets, or other written materials from your doctor or pharmacy?: 1 - Never  Interpreter Needed?: No      Activities of Daily Living In your present state of health, do you have any difficulty performing the following activities: 03/11/2020 03/19/2019  Hearing? N N  Vision? N N  Difficulty concentrating or making decisions? N N  Walking or climbing stairs? N N  Dressing or bathing? N N  Doing errands, shopping? N -  Preparing Food and eating ? N -  Using the Toilet? N -  In the past six months, have you accidently leaked urine? N -  Do you have problems with loss of bowel control? N -  Managing your Medications? N -  Managing your Finances? N -  Housekeeping or managing your Housekeeping? N -  Some recent data might be hidden    Patient Care  Team: Crecencio Mc, MD as PCP - General (Internal Medicine)  Indicate any recent Medical Services you may have received from other than [facility] providers in the past year (date may be approximate).     Assessment:   This is a routine wellness examination for [place].  I connected with Micheala today by telephone and verified that I am speaking with the correct person using two identifiers. Location patient: home Location provider: work Persons participating in the virtual visit: patient, Marine scientist.    I discussed the limitations, risks, security and privacy concerns of performing an evaluation and management service  by telephone and the availability of in person appointments. The patient expressed understanding and verbally consented to this telephonic visit.    Interactive audio and video telecommunications were attempted between this provider and patient, however failed, due to patient having technical difficulties OR patient did not have access to video capability.  We continued and completed visit with audio only.  Some vital signs may be absent or patient reported.   Hearing/Vision screen  Hearing Screening   _0  _1  _2  _3  _4  _5  _6  _7  _8   Right ear:           Left ear:           Comments: Patient is able to hear conversational tones without difficulty.  No issues reported.  Vision Screening Comments: Visual acuity not assessed, virtual visit.  Followed by Dr. Zigmund Daniel  No retinopathy reported  Cataract extraction, bilateral   Dietary issues and exercise activities discussed: Current Exercise Habits: The patient does not participate in regular exercise at present  She tries to have healthy diet Fair water intake  Goals       Diet     Healthy diet (pt-stated)      Lifestyle     Increase physical activity      Aerobic exercise 150 minutes per week (5 days, 30 minutes)       Depression Screen PHQ 2/9 Scores 03/11/2020 07/17/2019 03/09/2019  03/05/2018 01/22/2018 11/07/2016 03/24/2015  PHQ - 2 Score 0 0 0 0 0 0 0  PHQ- 9 Score - 0 - 0 1 1 -    Fall Risk Fall Risk  03/11/2020 07/17/2019 03/30/2019 03/09/2019 03/05/2018  Falls in the past year? 0 0 0 0 No  Number falls in past yr: 0 0 - - -  Injury with Fall? - 0 - - -  Follow up Falls evaluation completed Falls evaluation completed Falls evaluation completed - -   Handrails in use when climbing stairs? Yes Home free of loose throw rugs in walkways, pet beds, electrical cords, etc? Yes  Adequate lighting in your home to reduce risk of falls? Yes   ASSISTIVE DEVICES UTILIZED TO PREVENT FALLS: Life alert? No  Use of a cane, walker or w/c? No  Grab bars in the bathroom? No  Shower chair or bench in shower? No  Elevated toilet seat or a handicapped toilet? No   TIMED UP AND GO:  Was the test performed? No . Virtual visit.   Cognitive Function: Patient is alert and oriented x3.  Denies difficulty focusing, making decisions, memory loss. Enjoys reading for brain health.   MMSE - Mini Mental State Exam 03/05/2018  Orientation to time 5  Orientation to Place 5  Registration 3  Recall 3  Language- repeat 1  Language- read & follow direction 1  Copy design 1     6CIT Screen 03/09/2019  What Year? 0 points  What month? 0 points  What time? 0 points  Count back from 20 0 points  Months in reverse 0 points    Immunizations Immunization History  Administered Date(s) Administered   Influenza, High Dose Seasonal PF 03/24/2015, 03/05/2018   Influenza,inj,Quad PF,6+ Mos 07/01/2013   Pneumococcal Conjugate-13 03/17/2014   Pneumococcal Polysaccharide-23 07/02/2011   Tdap 10/10/2012   Health Maintenance Health Maintenance  Topic Date Due   FOOT EXAM  01/23/2019   HEMOGLOBIN A1C  10/05/2019   COVID-19 Vaccine (1) 03/27/2020 (Originally 12/31/1952)   INFLUENZA VACCINE  09/22/2020 (Originally 01/24/2020)   OPHTHALMOLOGY EXAM  12/30/2020   MAMMOGRAM  02/22/2021   TETANUS/TDAP   10/11/2022   COLONOSCOPY  03/18/2024   DEXA SCAN  Completed   Hepatitis C Screening  Completed   PNA vac Low Risk Adult  Completed    Covid vaccine- deferred per patient.   Influenza vaccine- deferred per patient.   Foot exam- denies wounds, numbness, tingling. Compression sock worn with intermittent L ankle swelling.   Metformin- taking 1.5 tablets daily.  Dental Screening: Recommended annual dental exams for proper oral hygiene.  Community Resource Referral / Chronic Care Management: CRR required this visit?  No   CCM required this visit?  No      Plan:   Keep all routine maintenance appointments.   I have personally reviewed and noted the following in the patient's chart:   Medical and social history Use of alcohol, tobacco or illicit drugs  Current medications and supplements Functional ability and status Nutritional status Physical activity Advanced directives List of other physicians Hospitalizations, surgeries, and ER visits in previous 12 months Vitals Screenings to include cognitive, depression, and falls Referrals and appointments  In addition, I have reviewed and discussed with patient certain preventive protocols, quality metrics, and best practice recommendations. A written personalized care plan for preventive services as well as general preventive health recommendations were provided to patient via mychart.     Crecencio Mc, MD   03/13/2020    I have reviewed the above information and agree with above.   Deborra Medina, MD

## 2020-03-27 ENCOUNTER — Other Ambulatory Visit: Payer: Self-pay | Admitting: Internal Medicine

## 2020-03-27 DIAGNOSIS — E034 Atrophy of thyroid (acquired): Secondary | ICD-10-CM

## 2020-05-05 ENCOUNTER — Other Ambulatory Visit: Payer: Self-pay | Admitting: Internal Medicine

## 2020-05-08 ENCOUNTER — Other Ambulatory Visit: Payer: Self-pay | Admitting: Internal Medicine

## 2020-05-08 DIAGNOSIS — E034 Atrophy of thyroid (acquired): Secondary | ICD-10-CM

## 2020-05-09 DIAGNOSIS — R69 Illness, unspecified: Secondary | ICD-10-CM | POA: Diagnosis not present

## 2020-05-25 ENCOUNTER — Other Ambulatory Visit: Payer: Self-pay | Admitting: Internal Medicine

## 2020-05-25 DIAGNOSIS — E782 Mixed hyperlipidemia: Secondary | ICD-10-CM

## 2020-06-06 ENCOUNTER — Other Ambulatory Visit: Payer: Self-pay | Admitting: Internal Medicine

## 2020-06-06 DIAGNOSIS — R69 Illness, unspecified: Secondary | ICD-10-CM | POA: Diagnosis not present

## 2020-06-30 DIAGNOSIS — E113393 Type 2 diabetes mellitus with moderate nonproliferative diabetic retinopathy without macular edema, bilateral: Secondary | ICD-10-CM | POA: Diagnosis not present

## 2020-07-08 ENCOUNTER — Other Ambulatory Visit: Payer: Self-pay | Admitting: Internal Medicine

## 2020-07-20 LAB — HM DIABETES EYE EXAM

## 2020-08-02 ENCOUNTER — Other Ambulatory Visit: Payer: Self-pay | Admitting: Internal Medicine

## 2020-08-26 ENCOUNTER — Other Ambulatory Visit: Payer: Self-pay | Admitting: Internal Medicine

## 2020-08-26 DIAGNOSIS — E782 Mixed hyperlipidemia: Secondary | ICD-10-CM

## 2020-09-17 ENCOUNTER — Other Ambulatory Visit: Payer: Self-pay | Admitting: Internal Medicine

## 2020-09-17 DIAGNOSIS — E034 Atrophy of thyroid (acquired): Secondary | ICD-10-CM

## 2020-09-29 ENCOUNTER — Other Ambulatory Visit: Payer: Self-pay | Admitting: Internal Medicine

## 2020-10-03 ENCOUNTER — Other Ambulatory Visit: Payer: Self-pay | Admitting: Internal Medicine

## 2020-11-01 ENCOUNTER — Other Ambulatory Visit: Payer: Self-pay | Admitting: Internal Medicine

## 2020-11-02 ENCOUNTER — Other Ambulatory Visit: Payer: Self-pay | Admitting: Internal Medicine

## 2020-11-23 ENCOUNTER — Other Ambulatory Visit: Payer: Self-pay | Admitting: Internal Medicine

## 2020-11-23 DIAGNOSIS — E782 Mixed hyperlipidemia: Secondary | ICD-10-CM

## 2020-12-18 ENCOUNTER — Other Ambulatory Visit: Payer: Self-pay | Admitting: Internal Medicine

## 2020-12-18 DIAGNOSIS — E034 Atrophy of thyroid (acquired): Secondary | ICD-10-CM

## 2020-12-19 ENCOUNTER — Other Ambulatory Visit: Payer: Self-pay | Admitting: Internal Medicine

## 2020-12-19 DIAGNOSIS — N1832 Chronic kidney disease, stage 3b: Secondary | ICD-10-CM

## 2020-12-19 DIAGNOSIS — E034 Atrophy of thyroid (acquired): Secondary | ICD-10-CM

## 2020-12-19 DIAGNOSIS — E1121 Type 2 diabetes mellitus with diabetic nephropathy: Secondary | ICD-10-CM

## 2020-12-19 NOTE — Telephone Encounter (Signed)
Refilled: 09/19/2020 Last OV: 07/17/2019 Next OV: not scheduled

## 2020-12-19 NOTE — Telephone Encounter (Signed)
Denied.  She has not had labs in over a year.  If she is willing to get labs will refill once I see the labs BUT SHE MUST SCHEDULE AN OV

## 2020-12-20 NOTE — Telephone Encounter (Signed)
Spoke with pt and scheduled both a lab appt and office visit.

## 2020-12-21 ENCOUNTER — Other Ambulatory Visit: Payer: Self-pay

## 2020-12-21 ENCOUNTER — Other Ambulatory Visit (INDEPENDENT_AMBULATORY_CARE_PROVIDER_SITE_OTHER): Payer: Medicare HMO

## 2020-12-21 DIAGNOSIS — N1832 Chronic kidney disease, stage 3b: Secondary | ICD-10-CM | POA: Diagnosis not present

## 2020-12-21 DIAGNOSIS — E034 Atrophy of thyroid (acquired): Secondary | ICD-10-CM

## 2020-12-21 DIAGNOSIS — E1121 Type 2 diabetes mellitus with diabetic nephropathy: Secondary | ICD-10-CM

## 2020-12-21 LAB — LIPID PANEL
Cholesterol: 158 mg/dL (ref 0–200)
HDL: 38 mg/dL — ABNORMAL LOW
LDL Cholesterol: 83 mg/dL (ref 0–99)
NonHDL: 119.56
Total CHOL/HDL Ratio: 4
Triglycerides: 183 mg/dL — ABNORMAL HIGH (ref 0.0–149.0)
VLDL: 36.6 mg/dL (ref 0.0–40.0)

## 2020-12-21 LAB — COMPREHENSIVE METABOLIC PANEL WITH GFR
ALT: 33 U/L (ref 0–35)
AST: 38 U/L — ABNORMAL HIGH (ref 0–37)
Albumin: 4.4 g/dL (ref 3.5–5.2)
Alkaline Phosphatase: 47 U/L (ref 39–117)
BUN: 18 mg/dL (ref 6–23)
CO2: 26 meq/L (ref 19–32)
Calcium: 9.7 mg/dL (ref 8.4–10.5)
Chloride: 101 meq/L (ref 96–112)
Creatinine, Ser: 1.3 mg/dL — ABNORMAL HIGH (ref 0.40–1.20)
GFR: 39.26 mL/min — ABNORMAL LOW
Glucose, Bld: 156 mg/dL — ABNORMAL HIGH (ref 70–99)
Potassium: 5.3 meq/L — ABNORMAL HIGH (ref 3.5–5.1)
Sodium: 138 meq/L (ref 135–145)
Total Bilirubin: 0.8 mg/dL (ref 0.2–1.2)
Total Protein: 7.4 g/dL (ref 6.0–8.3)

## 2020-12-21 LAB — MICROALBUMIN / CREATININE URINE RATIO
Creatinine,U: 148.1 mg/dL
Microalb Creat Ratio: 23.2 mg/g (ref 0.0–30.0)
Microalb, Ur: 34.3 mg/dL — ABNORMAL HIGH (ref 0.0–1.9)

## 2020-12-21 LAB — HEMOGLOBIN A1C: Hgb A1c MFr Bld: 8.1 % — ABNORMAL HIGH (ref 4.6–6.5)

## 2020-12-21 LAB — TSH: TSH: 1.88 u[IU]/mL (ref 0.35–4.50)

## 2020-12-22 LAB — PTH, INTACT AND CALCIUM
Calcium: 9.6 mg/dL (ref 8.6–10.4)
PTH: 74 pg/mL (ref 16–77)

## 2020-12-26 ENCOUNTER — Other Ambulatory Visit: Payer: Self-pay | Admitting: Internal Medicine

## 2020-12-26 DIAGNOSIS — N1832 Chronic kidney disease, stage 3b: Secondary | ICD-10-CM

## 2020-12-26 DIAGNOSIS — E1121 Type 2 diabetes mellitus with diabetic nephropathy: Secondary | ICD-10-CM

## 2020-12-26 NOTE — Assessment & Plan Note (Signed)
GFR is now < 40 ml/min.  Stopping metformin

## 2020-12-29 ENCOUNTER — Other Ambulatory Visit: Payer: Self-pay | Admitting: Internal Medicine

## 2020-12-29 ENCOUNTER — Telehealth: Payer: Self-pay | Admitting: Internal Medicine

## 2020-12-29 DIAGNOSIS — E034 Atrophy of thyroid (acquired): Secondary | ICD-10-CM

## 2020-12-29 NOTE — Telephone Encounter (Signed)
Refilled the requested medication for patient.

## 2020-12-29 NOTE — Telephone Encounter (Signed)
Pt called to follow up on refill for EUTHYROX 100 MCG tablet Pt stated that when she was called for labs she requested this

## 2020-12-30 ENCOUNTER — Other Ambulatory Visit: Payer: Self-pay

## 2021-01-03 ENCOUNTER — Other Ambulatory Visit: Payer: Self-pay | Admitting: Internal Medicine

## 2021-01-11 ENCOUNTER — Ambulatory Visit (INDEPENDENT_AMBULATORY_CARE_PROVIDER_SITE_OTHER): Payer: Medicare HMO | Admitting: Internal Medicine

## 2021-01-11 ENCOUNTER — Other Ambulatory Visit: Payer: Self-pay

## 2021-01-11 ENCOUNTER — Encounter: Payer: Self-pay | Admitting: Internal Medicine

## 2021-01-11 VITALS — BP 142/70 | HR 73 | Temp 98.4°F | Ht 65.98 in | Wt 204.8 lb

## 2021-01-11 DIAGNOSIS — I10 Essential (primary) hypertension: Secondary | ICD-10-CM

## 2021-01-11 DIAGNOSIS — E669 Obesity, unspecified: Secondary | ICD-10-CM

## 2021-01-11 DIAGNOSIS — E875 Hyperkalemia: Secondary | ICD-10-CM | POA: Diagnosis not present

## 2021-01-11 DIAGNOSIS — E1121 Type 2 diabetes mellitus with diabetic nephropathy: Secondary | ICD-10-CM

## 2021-01-11 DIAGNOSIS — E782 Mixed hyperlipidemia: Secondary | ICD-10-CM | POA: Diagnosis not present

## 2021-01-11 DIAGNOSIS — E034 Atrophy of thyroid (acquired): Secondary | ICD-10-CM | POA: Diagnosis not present

## 2021-01-11 DIAGNOSIS — K760 Fatty (change of) liver, not elsewhere classified: Secondary | ICD-10-CM | POA: Diagnosis not present

## 2021-01-11 DIAGNOSIS — N1832 Chronic kidney disease, stage 3b: Secondary | ICD-10-CM | POA: Diagnosis not present

## 2021-01-11 DIAGNOSIS — E1122 Type 2 diabetes mellitus with diabetic chronic kidney disease: Secondary | ICD-10-CM

## 2021-01-11 DIAGNOSIS — B354 Tinea corporis: Secondary | ICD-10-CM | POA: Insufficient documentation

## 2021-01-11 LAB — COMPREHENSIVE METABOLIC PANEL WITH GFR
ALT: 40 U/L — ABNORMAL HIGH (ref 0–35)
AST: 42 U/L — ABNORMAL HIGH (ref 0–37)
Albumin: 4.4 g/dL (ref 3.5–5.2)
Alkaline Phosphatase: 47 U/L (ref 39–117)
BUN: 20 mg/dL (ref 6–23)
CO2: 25 meq/L (ref 19–32)
Calcium: 9.9 mg/dL (ref 8.4–10.5)
Chloride: 104 meq/L (ref 96–112)
Creatinine, Ser: 1.29 mg/dL — ABNORMAL HIGH (ref 0.40–1.20)
GFR: 39.61 mL/min — ABNORMAL LOW
Glucose, Bld: 187 mg/dL — ABNORMAL HIGH (ref 70–99)
Potassium: 5.3 meq/L — ABNORMAL HIGH (ref 3.5–5.1)
Sodium: 137 meq/L (ref 135–145)
Total Bilirubin: 0.6 mg/dL (ref 0.2–1.2)
Total Protein: 7.8 g/dL (ref 6.0–8.3)

## 2021-01-11 LAB — HM DIABETES FOOT EXAM: HM Diabetic Foot Exam: NORMAL

## 2021-01-11 MED ORDER — GLIPIZIDE ER 2.5 MG PO TB24: 2.5000 mg | ORAL_TABLET | Freq: Every day | ORAL | 2 refills | Status: DC

## 2021-01-11 NOTE — Assessment & Plan Note (Signed)
lfts are normal; metformin stopped due to CKD/GFR.  Continue statin.  Weight loss and improved glycemic control advised.  Would benefit from ozempic but Tier 3 status pmakes it cost prohibitive

## 2021-01-11 NOTE — Assessment & Plan Note (Signed)
Thyroid function is WNL on current dose.  No current changes needed.   Lab Results  Component Value Date   TSH 1.88 12/21/2020

## 2021-01-11 NOTE — Assessment & Plan Note (Signed)
Tolerating simvastatin,  LDL is < 100.  No changes today

## 2021-01-11 NOTE — Assessment & Plan Note (Deleted)
suggeted by lesion on Left nipple,  Dime sized.  Not present yesterday per patient. No itching,

## 2021-01-11 NOTE — Progress Notes (Addendum)
Subjective:  Patient ID: Marissa Rodriguez, female    DOB: February 04, 1941  Age: 80 y.o. MRN: [id number]  CC: The primary encounter diagnosis was Type II diabetes mellitus with nephropathy ([place]). Diagnoses of Primary hypertension, Stage 3b chronic kidney disease ([place]), Ringworm, body, Hypothyroidism due to acquired atrophy of thyroid, Mixed hyperlipidemia, NAFLD (nonalcoholic fatty liver disease), Type 2 diabetes mellitus with stage 3b chronic kidney disease, without long-term current use of insulin ([place]), Obesity (BMI 30-39.9), and Hyperkalemia were also pertinent to this visit.  HPI Marissa Rodriguez presents for follow up on type 2 DM with CKD .and retinopathy  Last seen over one year ago as a virtual visit for follow up on COVID 19 infection Jan 2021  This visit occurred during the SARS-CoV-2 public health emergency.  Safety protocols were in place, including screening questions prior to the visit, additional usage of staff PPE, and extensive cleaning of exam room while observing appropriate contact time as indicated for disinfecting solutions.   1) history of COVID infection: weakness and anorexia resolved eventually .. received infusion..  refuses vaccination  2) Type 2 DM with CKD and retinopathy:  CKD has  progressed,  metformin was stopped after recent labs reviewed.,but she admits that she was not  NOT TAKING  the higher dose t regularly because of diarrhea   Nephrology referral made:  uncontrolled by June 29 labs   . Fasting  sugars have been ranging from 150 to 200 since stoppig the metformin .  Using the one touch glucometer   3)intermittent swelling of ankles:  occurs after sitting for prolonged periods of time.  Relieve with leg elevation. No orthopnea , DOE or history of CAD   Lab Results  Component Value Date   HGBA1C 8.1 (H) 12/21/2020   Lab Results  Component Value Date   CREATININE 1.29 (H) 01/11/2021   Lab Results  Component Value Date   TSH 1.88 12/21/2020     Outpatient  Medications Prior to Visit  Medication Sig Dispense Refill   aspirin EC 81 MG tablet Take 81 mg by mouth as needed.     blood glucose meter kit and supplies Dispense based on patient and insurance preference. Use to check blood sugars twice daily. (FOR ICD-10 E11.69). 1 each 0   EUTHYROX 100 MCG tablet TAKE 1 TABLET BY MOUTH ONCE DAILY BEFORE BREAKFAST 90 tablet 0   Lancets (ONETOUCH DELICA PLUS [id number]) MISC USE TO TEST BLOOD SUGAR TWICE DAILY     metoprolol succinate (TOPROL-XL) 25 MG 24 hr tablet TAKE 1 TABLET BY MOUTH AT BEDTIME 90 tablet 0   Omega-3 Fatty Acids (FISH OIL) 1000 MG CAPS Take by mouth as needed.     simvastatin (ZOCOR) 40 MG tablet Take 1 tablet by mouth once daily 90 tablet 0   sitaGLIPtin (JANUVIA) 50 MG tablet Take 1 tablet (50 mg total) by mouth daily. 30 tablet 5   telmisartan (MICARDIS) 80 MG tablet Take 1 tablet by mouth once daily 90 tablet 0   guaiFENesin-codeine (CHERATUSSIN AC) 100-10 MG/5ML syrup Take 5 mLs by mouth 3 (three) times daily as needed for cough. 120 mL 0   ONETOUCH VERIO test strip USE TO TEST BLOOD SUGAR UP TO TWICE DAILY 100 each 0   No facility-administered medications prior to visit.    Review of Systems;  Patient denies headache, fevers, malaise, unintentional weight loss, skin rash, eye pain, sinus congestion and sinus pain, sore throat, dysphagia,  hemoptysis , cough, dyspnea, wheezing, chest pain,  palpitations, orthopnea, edema, abdominal pain, nausea, melena, diarrhea, constipation, flank pain, dysuria, hematuria, urinary  Frequency, nocturia, numbness, tingling, seizures,  Focal weakness, Loss of consciousness,  Tremor, insomnia, depression, anxiety, and suicidal ideation.      Objective:  BP (!) 142/70   Pulse 73   Temp 98.4 F (36.9 C)   Ht 5' 5.98" (1.676 m)   Wt 204 lb 12.8 oz (92.9 kg)   SpO2 97%   BMI 33.07 kg/m   BP Readings from Last 3 Encounters:  01/11/21 (!) 142/70  07/08/19 (!) 144/61  03/30/19 (!) 160/71     Wt Readings from Last 3 Encounters:  01/11/21 204 lb 12.8 oz (92.9 kg)  03/11/20 180 lb (81.6 kg)  07/17/19 180 lb (81.6 kg)    General appearance: alert, cooperative and appears stated age Ears: normal TM's and external ear canals both ears Throat: lips, mucosa, and tongue normal; teeth and gums normal Neck: no adenopathy, no carotid bruit, supple, symmetrical, trachea midline and thyroid not enlarged, symmetric, no tenderness/mass/nodules Back: symmetric, no curvature. ROM normal. No CVA tenderness. Lungs: clear to auscultation bilaterally Heart: regular rate and rhythm, S1, S2 normal, no murmur, click, rub or gallop Abdomen: soft, non-tender; bowel sounds normal; no masses,  no organomegaly Pulses: 2+ and symmetric Skin: Skin color, texture, turgor normal. No rashes or lesions Lymph nodes: Cervical, supraclavicular, and axillary nodes normal.  Lab Results  Component Value Date   HGBA1C 8.1 (H) 12/21/2020   HGBA1C 7.9 (H) 04/06/2019   HGBA1C 7.7 (H) 02/19/2019    Lab Results  Component Value Date   CREATININE 1.29 (H) 01/11/2021   CREATININE 1.30 (H) 12/21/2020   CREATININE 1.06 04/06/2019    Lab Results  Component Value Date   WBC 9.4 02/19/2019   HGB 12.7 02/19/2019   HCT 38.6 02/19/2019   PLT 262.0 02/19/2019   GLUCOSE 187 (H) 01/11/2021   CHOL 158 12/21/2020   TRIG 183.0 (H) 12/21/2020   HDL 38.00 (L) 12/21/2020   LDLDIRECT 113.0 09/22/2015   LDLCALC 83 12/21/2020   ALT 40 (H) 01/11/2021   AST 42 (H) 01/11/2021   NA 137 01/11/2021   K 5.3 No hemolysis seen (H) 01/11/2021   CL 104 01/11/2021   CREATININE 1.29 (H) 01/11/2021   BUN 20 01/11/2021   CO2 25 01/11/2021   TSH 1.88 12/21/2020   HGBA1C 8.1 (H) 12/21/2020   MICROALBUR 34.3 (H) 12/21/2020    MM 3D SCREEN BREAST BILATERAL  Result Date: 02/23/2020 CLINICAL DATA:  Screening. EXAM: DIGITAL SCREENING BILATERAL MAMMOGRAM WITH TOMO AND CAD COMPARISON:  Previous exam(s). ACR Breast Density Category  b: There are scattered areas of fibroglandular density. FINDINGS: There are no findings suspicious for malignancy. Images were processed with CAD. IMPRESSION: No mammographic evidence of malignancy. A result letter of this screening mammogram will be mailed directly to the patient. RECOMMENDATION: Screening mammogram in one year. (Code:[id number]-B-01Y) BI-RADS CATEGORY  1: Negative. Electronically Signed   By: Claudie Revering M.D.   On: 02/23/2020 11:21    Assessment & Plan:   Problem List Items Addressed This Visit       Unprioritized   Hyperlipidemia    Tolerating simvastatin,  LDL is < 100.  No changes today       Relevant Medications   amLODipine (NORVASC) 5 MG tablet   Essential hypertension    Home readings have not been done lately..  She is taking telmisartan at maximal dose  and she has NAFLD with esophageal varices,  So Toprol XL 25 mg daily was added at last visit.   Will add hctz to telmisartan pending repeat evaluation of GFR        Relevant Medications   amLODipine (NORVASC) 5 MG tablet   Hypothyroidism    Thyroid function is WNL on current dose.  No current changes needed.   Lab Results  Component Value Date   TSH 1.88 12/21/2020         Obesity (BMI 30-39.9)   Relevant Medications   glipiZIDE (GLUCOTROL XL) 2.5 MG 24 hr tablet   NAFLD (nonalcoholic fatty liver disease)    lfts are normal; metformin stopped due to CKD/GFR.  Continue statin.  Weight loss and improved glycemic control advised.  Would benefit from ozempic but Tier 3 status pmakes it cost prohibitive       Type 2 diabetes mellitus with stage 3 chronic kidney disease ([place]) - Primary    Uncontrolled due to being lost to follow up for over one year.  Fasting sugars have been 150 to 200 , often on the 200 side. Diet reviewed; no concentrated sweets. Suspending metformin due to GFR  .  Adding low dose  XR glipizide until she can be started on ozempic.   Referral to Nephrology in process        Relevant  Medications   glipiZIDE (GLUCOTROL XL) 2.5 MG 24 hr tablet   Hyperkalemia    New onset.  Suspending telmisartan , repeat BMET 2 weeks       Relevant Orders   Basic metabolic panel   RESOLVED: Ringworm, body   Other Visit Diagnoses     Stage 3b chronic kidney disease ([place])       Relevant Orders   AMB Referral to [facility]   Basic metabolic panel     A total of 40 minutes was spent with patient more than half of which was spent in counseling patient on her CKD and diabetes,  reviewing and explaining recent labs and imaging studies done, and coordination of care.   I am having Marissa Rodriguez start on glipiZIDE and amLODipine. I am also having her maintain her blood glucose meter kit and supplies, OneTouch Delica Plus [id number], Fish Oil, aspirin EC, sitaGLIPtin, metoprolol succinate, telmisartan, simvastatin, and Euthyrox.  Meds ordered this encounter  Medications   glipiZIDE (GLUCOTROL XL) 2.5 MG 24 hr tablet    Sig: Take 1 tablet (2.5 mg total) by mouth daily with breakfast.    Dispense:  30 tablet    Refill:  2   amLODipine (NORVASC) 5 MG tablet    Sig: Take 1 tablet (5 mg total) by mouth daily.    Dispense:  30 tablet    Refill:  2    There are no discontinued medications.  Follow-up: No follow-ups on file.   Crecencio Mc, MD

## 2021-01-11 NOTE — Assessment & Plan Note (Addendum)
Uncontrolled due to being lost to follow up for over one year.  Fasting sugars have been 150 to 200 , often on the 200 side. Diet reviewed; no concentrated sweets. Suspending metformin due to GFR  .  Adding low dose  XR glipizide until she can be started on ozempic.   Referral to Nephrology in process

## 2021-01-11 NOTE — Patient Instructions (Addendum)
Starting you on glipizide  XL  one tablet daily for your diabetes:  The most informative times to check blood sugars are either 1) fasting or 2) 2 hours after a meal (post prandial).  Since we already know your fasting sugars are high  change your testing time to postprandial,  and target a given meal for 3 days in a row before switching to another time.    I am letting you know that I am referring to our clinical pharmacist ,  [name] Darnelle Maffucci.  [name] helps me provide additional services to my patients who are on Medicare and dealing with  chronic diseases , like diabetes.  I do not expect this referral to cost you anything out of pocket, but I do think [name] will be able to help you get your diabetes under control and maximize your drug benefits.  She will make contact with  You by phone in the next week

## 2021-01-11 NOTE — Assessment & Plan Note (Addendum)
Home readings have not been done lately..  She is taking telmisartan at maximal dose  and she has NAFLD with esophageal varices,  So Toprol XL 25 mg daily was added at last visit.   Will add hctz to telmisartan pending repeat evaluation of GFR

## 2021-01-12 ENCOUNTER — Telehealth: Payer: Self-pay

## 2021-01-12 NOTE — Chronic Care Management (AMB) (Signed)
  Chronic Care Management   Note  01/12/2021 Name: Marissa Rodriguez MRN: [id number] DOB: [birth year]  [name] is a 80 y.o. year old female who is a primary care patient of Derrel Nip, Aris Everts, MD. I reached out to Sharis W Rodriguez by phone today in response to a referral sent by Ms. Tor Netters Cafarella's PCP, Crecencio Mc, MD      Ms. Rodriguez was given information about Chronic Care Management services today including:  CCM service includes personalized support from designated clinical staff supervised by her physician, including individualized plan of care and coordination with other care providers 24/7 contact phone numbers for assistance for urgent and routine care needs. Service will only be billed when office clinical staff spend 20 minutes or more in a month to coordinate care. Only one practitioner may furnish and bill the service in a calendar month. The patient may stop CCM services at any time (effective at the end of the month) by phone call to the office staff. The patient will be responsible for cost sharing (co-pay) of up to 20% of the service fee (after annual deductible is met).  Patient agreed to services and verbal consent obtained.   Follow up plan: Telephone appointment with care management team member scheduled for:02/01/2021  Noreene Larsson, [place], [place], [place] [address] Direct Dial: [phone] [email or web address] Website: [email or web address]

## 2021-01-16 ENCOUNTER — Encounter: Payer: Self-pay | Admitting: Internal Medicine

## 2021-01-16 ENCOUNTER — Other Ambulatory Visit: Payer: Self-pay | Admitting: Internal Medicine

## 2021-01-16 DIAGNOSIS — E875 Hyperkalemia: Secondary | ICD-10-CM

## 2021-01-16 HISTORY — DX: Hyperkalemia: E87.5

## 2021-01-16 MED ORDER — AMLODIPINE BESYLATE 5 MG PO TABS: 5.0000 mg | ORAL_TABLET | Freq: Every day | ORAL | 2 refills | Status: DC

## 2021-01-16 NOTE — Addendum Note (Signed)
Addended by: Crecencio Mc on: 01/16/2021 02:22 PM   Modules accepted: Orders

## 2021-01-16 NOTE — Assessment & Plan Note (Signed)
New onset.  Suspending telmisartan , repeat BMET 2 weeks

## 2021-01-19 ENCOUNTER — Ambulatory Visit (INDEPENDENT_AMBULATORY_CARE_PROVIDER_SITE_OTHER): Payer: Medicare HMO | Admitting: Pharmacist

## 2021-01-19 DIAGNOSIS — I10 Essential (primary) hypertension: Secondary | ICD-10-CM | POA: Diagnosis not present

## 2021-01-19 DIAGNOSIS — E034 Atrophy of thyroid (acquired): Secondary | ICD-10-CM

## 2021-01-19 DIAGNOSIS — N1832 Chronic kidney disease, stage 3b: Secondary | ICD-10-CM

## 2021-01-19 DIAGNOSIS — K760 Fatty (change of) liver, not elsewhere classified: Secondary | ICD-10-CM

## 2021-01-19 DIAGNOSIS — E1121 Type 2 diabetes mellitus with diabetic nephropathy: Secondary | ICD-10-CM | POA: Diagnosis not present

## 2021-01-19 DIAGNOSIS — E782 Mixed hyperlipidemia: Secondary | ICD-10-CM | POA: Diagnosis not present

## 2021-01-19 MED ORDER — OZEMPIC (0.25 OR 0.5 MG/DOSE) 2 MG/1.5ML ~~LOC~~ SOPN: PEN_INJECTOR | SUBCUTANEOUS | 0 refills | Status: DC

## 2021-01-19 NOTE — Patient Instructions (Signed)
Marissa Rodriguez,   It was great talking to you today!  We will start Ozempic. This medication may cause stomach upset, queasiness, or constipation, especially when first starting. This generally improves over time. Call our office if these symptoms occur and worsen, or if you have severe symptoms such as vomiting, diarrhea, or stomach pain.   You will start Ozempic 0.25 mg once weekly for 4 weeks, then on week 5, increase to Ozempic 0.5 mg weekly. They will teach you this and provide you a sample at your nurse visit in 2 weeks. For now, continue glipizide 2.5 mg every morning. We will stop this moving forward as the Ozempic takes effect.   We will apply for patient assistance from the manufacturer. Please bring proof of your 2022 income with you to the appointment. Also make sure the front desk scans in your [employer] card.   Make sure your nephrologist knows that you are holding the telmisartan because of the high potassium level on your last lab work. If they decide to recheck your renal function and electrolytes at that appointment and give you instruction about the telmisartan, you may not need the lab work at our office - give me a call if so and we can decide.  Take care!  [name] Darnelle Maffucci, PharmD [phone]    Visit Information   PATIENT GOALS:   Goals Addressed               This Visit's Progress     Patient Stated     Medication Monitoring (pt-stated)        Patient Goals/Self-Care Activities Over the next 90 days, patient will:  - take medications as prescribed check glucose twice daily, document, and provide at future appointments check blood pressure periodically, document, and provide at future appointments collaborate with provider on medication access solutions        Consent to CCM Services: Marissa Rodriguez was given information about Chronic Care Management services today including:  CCM service includes personalized support from designated clinical staff  supervised by her physician, including individualized plan of care and coordination with other care providers 24/7 contact phone numbers for assistance for urgent and routine care needs. Service will only be billed when office clinical staff spend 20 minutes or more in a month to coordinate care. Only one practitioner may furnish and bill the service in a calendar month. The patient may stop CCM services at any time (effective at the end of the month) by phone call to the office staff. The patient will be responsible for cost sharing (co-pay) of up to 20% of the service fee (after annual deductible is met).  Patient agreed to services and verbal consent obtained.   The patient verbalized understanding of instructions, educational materials, and care plan provided today and agreed to receive a mailed copy of patient instructions, educational materials, and care plan.    Plan: Telephone follow up appointment with care management team member scheduled for:  ~ 6 weeks  [name] Darnelle Maffucci, PharmD, [place], CPP Clinical Pharmacist [place] [facility]: Patient Care Plan: Medication Management     Problem Identified: Diabetes, HTN, HLD      Long-Range Goal: Disease Progression Prevention   Start Date: 01/19/2021  This Visit's Progress: On track  Priority: High  Note:   Current Barriers:  Unable to independently afford treatment regimen Unable to achieve control of diabetes   Pharmacist Clinical Goal(s):  Over the next 90 days, patient  will achieve control of diabetes as evidenced by A1c  through collaboration with PharmD and provider.   Interventions: 1:1 collaboration with Crecencio Mc, MD regarding development and update of comprehensive plan of care as evidenced by provider attestation and co-signature Inter-disciplinary care team collaboration (see longitudinal plan of care) Comprehensive medication review performed; medication list  updated in electronic medical record  SDOH: Widowed. Sons. Lives alone  Diabetes: Uncontrolled; current treatment: glipizide XL 2.5 mg QAM Hx metformin - stopped d/t diarrhea, kidney function Hx Januvia - does not remember ever taking Current glucose readings: fasting glucose: not checking, post prandial glucose: 145-200 Current meal patterns: breakfast: often skips, sometimes protein bar, water; lunch: vegetables, salad, sometimes proteins, trying to eat more chicken than lean meals, minimizes carbs; dinner: sometimes skips if lunch is a bigger meal; sometimes fruits; drinks: water w/ no sugar flavoring Current exercise: not a lot lately; but is thinking about joining the gym Extensive dietary education. Discussed focusing on lean proteins, fruits, vegetables, lower carbs. Discussed protein sources with each meal to help stabilize sugars.  Extensive education regarding benefit of regular physical therapy. Discussed setting attainable goals, such as 10-15 minutes 2-3 days per week, and working up from there.  Discussed that long term, sulfonlyureas are not preferred due to risks of hypoglycemia, weight gain, beta cell burn out. Counseled on GLP1 agonists, including mechanism of action, side effects, and benefits. No personal or family history of medullary thyroid cancer, personal history of pancreatitis or gallbladder disease. Counseled on potential side effects of nausea, stomach upset, queasiness, constipation, and that these generally improve over time. Advised to contact our office with more severe symptoms, including nausea, diarrhea, stomach pain. Patient verbalized understanding. Start Ozempic 0.25 mg weekly for 4 weeks then increase to 0.5 mg weekly. Scheduled for nurse visit with upcoming lab work for administration education. Continue glipizide at this time, and will discontinue moving forward as Ozempic is titrated.  Discussed income. She is just over the income limit (pre-deduction  income) and is over the asset limit for Medicare Extra Help. She does meet income requirements for [employer] assistance. Will prepare patient assistance application for her to sign while in the office for teaching in 2 weeks.  Considered SGLT2, however, may not be able to get to A1c goal. Additionally, patient has fatty liver disease and would benefit from weight loss associated w/ GLP1. Can consider SGLT2 in the future for nephroprotection, especially if ACEi/ARB is unable to be used.   Hypertension: Moderately well controlled; current treatment: metoprolol succinate 25 mg daily, amlodipine 5 mg daily Telmisartan 80 mg daily ON HOLD due to hyperkalemia. Repeat BMP in 2 weeks Current home readings: 137/69 most recent reading. Denies any LEE since starting amlodipine Recommend to continue current regimen at this time. Advised to be sure to communicate with nephrology that telmisartan is on hold until result of repeat BMP. Could consider restarting at lower dose moving forward.   Hyperlipidemia: Controlled at goal <100 current treatment: simvastatin 40 mg daily;  Medications previously tried: none If higher doses of amlodipine required moving forward, recommend discontinuing simvastatin and starting atorvastatin (given renal function, dosing of rosuvastatin would be limited) to avoid drug interaction with simvastatin and high dose amlodipine Recommended to continue current regimen at this time  Hypothyroidism: Controlled per last lab work; current regimen: levothyroxine 100 mcg daily Continue current regimen at this time  Patient Goals/Self-Care Activities Over the next 90 days, patient will:  - take medications as prescribed check glucose  twice daily, document, and provide at future appointments check blood pressure periodically, document, and provide at future appointments collaborate with provider on medication access solutions  Follow Up Plan: Telephone follow up appointment with care  management team member scheduled for: ~ 6 weeks

## 2021-01-19 NOTE — Chronic Care Management (AMB) (Signed)
Chronic Care Management Pharmacy Note  01/19/2021 Name:  Marissa Rodriguez MRN:  [id number] DOB:  July 27, 1940  Subjective: Marissa Rodriguez is an 80 y.o. year old female who is a primary patient of Derrel Nip, Aris Everts, MD.  The CCM team was consulted for assistance with disease management and care coordination needs.    Engaged with patient by telephone for initial visit in response to provider referral for pharmacy case management and/or care coordination services.   Consent to Services:  The patient was given the following information about Chronic Care Management services today, agreed to services, and gave verbal consent: 1. CCM service includes personalized support from designated clinical staff supervised by the primary care provider, including individualized plan of care and coordination with other care providers 2. 24/7 contact phone numbers for assistance for urgent and routine care needs. 3. Service will only be billed when office clinical staff spend 20 minutes or more in a month to coordinate care. 4. Only one practitioner may furnish and bill the service in a calendar month. 5.The patient may stop CCM services at any time (effective at the end of the month) by phone call to the office staff. 6. The patient will be responsible for cost sharing (co-pay) of up to 20% of the service fee (after annual deductible is met). Patient agreed to services and consent obtained.  Patient Care Team: Crecencio Mc, MD as PCP - General (Internal Medicine) De Hollingshead, RPH-CPP (Pharmacist)  Recent office visits: 7/20 - PCP - patient stopped metformin due to diarrhea; hyperkalemia, held telmisartan, added amlodipine, A1c 8.1%, LDL 83, eGFR 39  Objective:  Lab Results  Component Value Date   CREATININE 1.29 (H) 01/11/2021   CREATININE 1.30 (H) 12/21/2020   CREATININE 1.06 04/06/2019    Lab Results  Component Value Date   HGBA1C 8.1 (H) 12/21/2020   Last diabetic Eye exam:  Lab Results   Component Value Date/Time   HMDIABEYEEXA Retinopathy (A) 07/20/2020 12:00 AM    Last diabetic Foot exam:  Lab Results  Component Value Date/Time   HMDIABFOOTEX normal 01/11/2021 12:00 AM        Component Value Date/Time   CHOL 158 12/21/2020 0854   TRIG 183.0 (H) 12/21/2020 0854   HDL 38.00 (L) 12/21/2020 0854   CHOLHDL 4 12/21/2020 0854   VLDL 36.6 12/21/2020 0854   LDLCALC 83 12/21/2020 0854   LDLDIRECT 113.0 09/22/2015 1137    Hepatic Function Latest Ref Rng & Units 01/11/2021 12/21/2020 04/06/2019  Total Protein 6.0 - 8.3 g/dL 7.8 7.4 7.2  Albumin 3.5 - 5.2 g/dL 4.4 4.4 4.3  AST 0 - 37 U/L 42(H) 38(H) 21  ALT 0 - 35 U/L 40(H) 33 21  Alk Phosphatase 39 - 117 U/L 47 47 46  Total Bilirubin 0.2 - 1.2 mg/dL 0.6 0.8 0.6    Lab Results  Component Value Date/Time   TSH 1.88 12/21/2020 08:54 AM   TSH 2.18 04/06/2019 10:06 AM    CBC Latest Ref Rng & Units 02/19/2019 09/09/2012 10/02/2011  WBC 4.0 - 10.5 K/uL 9.4 8.1 9.7  Hemoglobin 12.0 - 15.0 g/dL 12.7 14.0 12.5  Hematocrit 36.0 - 46.0 % 38.6 41.2 37.4  Platelets 150.0 - 400.0 K/uL 262.0 266.0 388    Lab Results  Component Value Date/Time   VD25OH 32.30 11/07/2016 09:10 AM   VD25OH 24.01 (L) 09/22/2015 11:37 AM    Clinical ASCVD: No  The 10-year ASCVD risk score Mikey Bussing DC Jr., et al., 2013)  is: 57%   Values used to calculate the score:     Age: 54 years     Sex: Female     Is Non-Hispanic African American: No     Diabetic: Yes     Tobacco smoker: No     Systolic Blood Pressure: 080 mmHg     Is BP treated: Yes     HDL Cholesterol: 38 mg/dL     Total Cholesterol: 158 mg/dL     Social History   Tobacco Use  Smoking Status Never  Smokeless Tobacco Never   BP Readings from Last 3 Encounters:  01/11/21 (!) 142/70  07/08/19 (!) 144/61  03/30/19 (!) 160/71   Pulse Readings from Last 3 Encounters:  01/11/21 73  07/08/19 84  03/30/19 71   Wt Readings from Last 3 Encounters:  01/11/21 204 lb 12.8 oz (92.9 kg)   03/11/20 180 lb (81.6 kg)  07/17/19 180 lb (81.6 kg)    Assessment: Review of patient past medical history, allergies, medications, health status, including review of consultants reports, laboratory and other test data, was performed as part of comprehensive evaluation and provision of chronic care management services.   SDOH:  (Social Determinants of Health) assessments and interventions performed:  SDOH Interventions    Flowsheet Row Most Recent Value  SDOH Interventions   Financial Strain Interventions Other (Comment)  [manufacturer assistance]       CCM Care Plan  No Known Allergies  Medications Reviewed Today     Reviewed by De Hollingshead, RPH-CPP (Pharmacist) on 01/19/21 at 16  Med List Status: <None>   Medication Order Taking? Sig Documenting Provider Last Dose Status Informant  amLODipine (NORVASC) 5 MG tablet [id number] Yes Take 1 tablet (5 mg total) by mouth daily. Crecencio Mc, MD Taking Active   blood glucose meter kit and supplies [id number] Yes Dispense based on patient and insurance preference. Use to check blood sugars twice daily. (FOR ICD-10 E11.69). Crecencio Mc, MD Taking Active   EUTHYROX 100 MCG tablet [id number] Yes TAKE 1 TABLET BY MOUTH ONCE DAILY BEFORE BREAKFAST Crecencio Mc, MD Taking Active   glipiZIDE (GLUCOTROL XL) 2.5 MG 24 hr tablet [id number] Yes Take 1 tablet (2.5 mg total) by mouth daily with breakfast. Crecencio Mc, MD Taking Active   Lancets (ONETOUCH DELICA PLUS DTHYHO88L) [place] [id number]  USE TO TEST BLOOD SUGAR TWICE DAILY [provider]  Active   metoprolol succinate (TOPROL-XL) 25 MG 24 hr tablet [id number] Yes TAKE 1 TABLET BY MOUTH AT BEDTIME Crecencio Mc, MD Taking Active   Omega-3 Fatty Acids (FISH OIL) 1000 MG CAPS [id number] Yes Take 1 capsule by mouth daily. [provider] Taking Active   [facility] VERIO test strip [id number] Yes USE  TO CHECK GLUCOSE UP TO TWICE DAILY Crecencio Mc, MD Taking  Active   simvastatin (ZOCOR) 40 MG tablet [id number] Yes Take 1 tablet by mouth once daily Crecencio Mc, MD Taking Active   telmisartan (MICARDIS) 80 MG tablet [id number] No Take 1 tablet by mouth once daily  Patient not taking: Reported on 01/19/2021   Crecencio Mc, MD Not Taking Active             Patient Active Problem List   Diagnosis Date Noted   Hyperkalemia 01/16/2021   Personal history of colonic polyps    Polyp of transverse colon    Vitamin D deficiency 09/24/2015   S/P TAH-BSO (total abdominal hysterectomy and bilateral salpingo-oophorectomy) 03/17/2014  NAFLD (nonalcoholic fatty liver disease) 12/10/2012   Type 2 diabetes mellitus with stage 3 chronic kidney disease ([place]) 12/10/2012   Encounter for preventive health examination 09/09/2012   History of pneumonia 10/14/2011   Obesity (BMI 30-39.9) 10/14/2011   Secondary esophageal varices without bleeding ([place]) 10/14/2011   Tubular adenoma of colon    Essential hypertension 09/24/2011   Hypothyroidism 09/24/2011   Hyperlipidemia     Immunization History  Administered Date(s) Administered   Influenza, High Dose Seasonal PF 03/24/2015, 03/05/2018   Influenza,inj,Quad PF,6+ Mos 07/01/2013   Pneumococcal Conjugate-13 03/17/2014   Pneumococcal Polysaccharide-23 07/02/2011   Tdap 10/10/2012    Conditions to be addressed/monitored: HTN, HLD, and DMII  Care Plan : Medication Management  Updates made by De Hollingshead, RPH-CPP since 01/19/2021 12:00 AM     Problem: Diabetes, HTN, HLD      Long-Range Goal: Disease Progression Prevention   Start Date: 01/19/2021  This Visit's Progress: On track  Priority: High  Note:   Current Barriers:  Unable to independently afford treatment regimen Unable to achieve control of diabetes   Pharmacist Clinical Goal(s):  Over the next 90 days, patient will achieve control of diabetes as evidenced by A1c  through collaboration with PharmD and provider.    Interventions: 1:1 collaboration with Crecencio Mc, MD regarding development and update of comprehensive plan of care as evidenced by provider attestation and co-signature Inter-disciplinary care team collaboration (see longitudinal plan of care) Comprehensive medication review performed; medication list updated in electronic medical record  SDOH: Widowed. Sons. Lives alone  Diabetes: Uncontrolled; current treatment: glipizide XL 2.5 mg QAM Hx metformin - stopped d/t diarrhea, kidney function Hx Januvia - does not remember ever taking Current glucose readings: fasting glucose: not checking, post prandial glucose: 145-200 Current meal patterns: breakfast: often skips, sometimes protein bar, water; lunch: vegetables, salad, sometimes proteins, trying to eat more chicken than lean meals, minimizes carbs; dinner: sometimes skips if lunch is a bigger meal; sometimes fruits; drinks: water w/ no sugar flavoring Current exercise: not a lot lately; but is thinking about joining the gym Extensive dietary education. Discussed focusing on lean proteins, fruits, vegetables, lower carbs. Discussed protein sources with each meal to help stabilize sugars.  Extensive education regarding benefit of regular physical therapy. Discussed setting attainable goals, such as 10-15 minutes 2-3 days per week, and working up from there.  Discussed that long term, sulfonlyureas are not preferred due to risks of hypoglycemia, weight gain, beta cell burn out. Counseled on GLP1 agonists, including mechanism of action, side effects, and benefits. No personal or family history of medullary thyroid cancer, personal history of pancreatitis or gallbladder disease. Counseled on potential side effects of nausea, stomach upset, queasiness, constipation, and that these generally improve over time. Advised to contact our office with more severe symptoms, including nausea, diarrhea, stomach pain. Patient verbalized  understanding. Start Ozempic 0.25 mg weekly for 4 weeks then increase to 0.5 mg weekly. Scheduled for nurse visit with upcoming lab work for administration education. Continue glipizide at this time, and will discontinue moving forward as Ozempic is titrated.  Discussed income. She is just over the income limit (pre-deduction income) and is over the asset limit for Medicare Extra Help. She does meet income requirements for [employer] assistance. Will prepare patient assistance application for her to sign while in the office for teaching in 2 weeks.  Considered SGLT2, however, may not be able to get to A1c goal. Additionally, patient has fatty liver disease and  would benefit from weight loss associated w/ GLP1. Can consider SGLT2 in the future for nephroprotection, especially if ACEi/ARB is unable to be used.   Hypertension: Moderately well controlled; current treatment: metoprolol succinate 25 mg daily, amlodipine 5 mg daily Telmisartan 80 mg daily ON HOLD due to hyperkalemia. Repeat BMP in 2 weeks Current home readings: 137/69 most recent reading. Denies any LEE since starting amlodipine Recommend to continue current regimen at this time. Advised to be sure to communicate with nephrology that telmisartan is on hold until result of repeat BMP. Could consider restarting at lower dose moving forward.   Hyperlipidemia: Controlled at goal <100 current treatment: simvastatin 40 mg daily;  Medications previously tried: none If higher doses of amlodipine required moving forward, recommend discontinuing simvastatin and starting atorvastatin (given renal function, dosing of rosuvastatin would be limited) to avoid drug interaction with simvastatin and high dose amlodipine Recommended to continue current regimen at this time  Hypothyroidism: Controlled per last lab work; current regimen: levothyroxine 100 mcg daily Continue current regimen at this time  Patient Goals/Self-Care Activities Over the next  90 days, patient will:  - take medications as prescribed check glucose twice daily, document, and provide at future appointments check blood pressure periodically, document, and provide at future appointments collaborate with provider on medication access solutions  Follow Up Plan: Telephone follow up appointment with care management team member scheduled for: ~ 6 weeks      Medication Assistance: Application for Ozempic  medication assistance program. in process.  Anticipated assistance start date TBD.  See plan of care for additional detail.  Patient's preferred pharmacy is:  [facility] [address], [place] - [place] [address] Phone: [phone] Fax: [phone]  [facility] [place], [place] [address] Phone: [phone] Fax: [phone]    Follow Up:  Patient agrees to Care Plan and Follow-up.  Plan: Telephone follow up appointment with care management team member scheduled for:  ~ 6 weeks  [name] Darnelle Maffucci, PharmD, [place], CPP Clinical Pharmacist [place] at [facility] [phone]  Medication Samples have been labeled and logged for the patient. She will pick up at her 94/10 nurse visit.  Drug name: Ozempic       Strength: 0.25/0.5 mg        Qty: 1.5 mL  LOT: [id number]  Exp.Date: 07/25/2022

## 2021-01-26 DIAGNOSIS — E1122 Type 2 diabetes mellitus with diabetic chronic kidney disease: Secondary | ICD-10-CM | POA: Diagnosis not present

## 2021-01-26 DIAGNOSIS — N1832 Chronic kidney disease, stage 3b: Secondary | ICD-10-CM | POA: Diagnosis not present

## 2021-01-26 DIAGNOSIS — I10 Essential (primary) hypertension: Secondary | ICD-10-CM | POA: Diagnosis not present

## 2021-01-26 DIAGNOSIS — E875 Hyperkalemia: Secondary | ICD-10-CM | POA: Diagnosis not present

## 2021-01-27 ENCOUNTER — Other Ambulatory Visit: Payer: Self-pay | Admitting: Nephrology

## 2021-01-27 ENCOUNTER — Other Ambulatory Visit (HOSPITAL_COMMUNITY): Payer: Self-pay | Admitting: Nephrology

## 2021-01-27 DIAGNOSIS — N1832 Chronic kidney disease, stage 3b: Secondary | ICD-10-CM

## 2021-01-27 DIAGNOSIS — E1122 Type 2 diabetes mellitus with diabetic chronic kidney disease: Secondary | ICD-10-CM

## 2021-01-30 ENCOUNTER — Other Ambulatory Visit: Payer: Self-pay | Admitting: Internal Medicine

## 2021-02-01 ENCOUNTER — Other Ambulatory Visit: Payer: Self-pay

## 2021-02-01 ENCOUNTER — Other Ambulatory Visit (INDEPENDENT_AMBULATORY_CARE_PROVIDER_SITE_OTHER): Payer: Medicare HMO

## 2021-02-01 ENCOUNTER — Telehealth: Payer: Medicare HMO

## 2021-02-01 ENCOUNTER — Ambulatory Visit (INDEPENDENT_AMBULATORY_CARE_PROVIDER_SITE_OTHER): Payer: Medicare HMO | Admitting: Pharmacist

## 2021-02-01 ENCOUNTER — Ambulatory Visit (INDEPENDENT_AMBULATORY_CARE_PROVIDER_SITE_OTHER): Payer: Medicare HMO

## 2021-02-01 DIAGNOSIS — E875 Hyperkalemia: Secondary | ICD-10-CM

## 2021-02-01 DIAGNOSIS — I10 Essential (primary) hypertension: Secondary | ICD-10-CM

## 2021-02-01 DIAGNOSIS — E782 Mixed hyperlipidemia: Secondary | ICD-10-CM | POA: Diagnosis not present

## 2021-02-01 DIAGNOSIS — E1121 Type 2 diabetes mellitus with diabetic nephropathy: Secondary | ICD-10-CM

## 2021-02-01 DIAGNOSIS — N1832 Chronic kidney disease, stage 3b: Secondary | ICD-10-CM

## 2021-02-01 LAB — BASIC METABOLIC PANEL WITH GFR
BUN: 26 mg/dL — ABNORMAL HIGH (ref 6–23)
CO2: 23 meq/L (ref 19–32)
Calcium: 9.8 mg/dL (ref 8.4–10.5)
Chloride: 104 meq/L (ref 96–112)
Creatinine, Ser: 1.12 mg/dL (ref 0.40–1.20)
GFR: 46.91 mL/min — ABNORMAL LOW
Glucose, Bld: 183 mg/dL — ABNORMAL HIGH (ref 70–99)
Potassium: 4.8 meq/L (ref 3.5–5.1)
Sodium: 138 meq/L (ref 135–145)

## 2021-02-01 NOTE — Patient Instructions (Signed)
Visit Information  PATIENT GOALS:  Goals Addressed               This Visit's Progress     Patient Stated     Medication Monitoring (pt-stated)        Patient Goals/Self-Care Activities Over the next 90 days, patient will:  - take medications as prescribed check glucose twice daily, document, and provide at future appointments check blood pressure periodically, document, and provide at future appointments collaborate with provider on medication access solutions         Patient verbalizes understanding of instructions provided today and agrees to view in [place].  Plan: Telephone follow up appointment with care management team member scheduled for:  ~4 weeks  [name] Darnelle Maffucci, PharmD, [place], [place] Clinical Pharmacist [employer] at [employer] [phone]

## 2021-02-01 NOTE — Progress Notes (Signed)
Patient presented for [facility] teaching. Per Providers order from 01-19-21. Patient voiced no concerns nor showed signs of distress during injection. Patient was instructed on how to give herself the ozempic injection. Instructed patient that if further questions, they can call clinic to be given further direction.

## 2021-02-01 NOTE — Chronic Care Management (AMB) (Signed)
Chronic Care Management Pharmacy Note  02/01/2021 Name:  Marissa Rodriguez MRN:  [id number] DOB:  [birth year]   Subjective: Marissa Rodriguez is an 80 y.o. year old female who is a primary patient of Derrel Nip, Aris Everts, MD.  The CCM team was consulted for assistance with disease management and care coordination needs.    Care coordination  for  medication access  in response to provider referral for pharmacy case management and/or care coordination services.   Consent to Services:  The patient was given information about Chronic Care Management services, agreed to services, and gave verbal consent prior to initiation of services.  Please see initial visit note for detailed documentation.   Patient Care Team: Crecencio Mc, MD as PCP - General (Internal Medicine) De Hollingshead, RPH-CPP (Pharmacist)  Objective:  Lab Results  Component Value Date   CREATININE 1.12 02/01/2021   CREATININE 1.29 (H) 01/11/2021   CREATININE 1.30 (H) 12/21/2020    Lab Results  Component Value Date   HGBA1C 8.1 (H) 12/21/2020   Last diabetic Eye exam:  Lab Results  Component Value Date/Time   HMDIABEYEEXA Retinopathy (A) 07/20/2020 12:00 AM    Last diabetic Foot exam:  Lab Results  Component Value Date/Time   HMDIABFOOTEX normal 01/11/2021 12:00 AM        Component Value Date/Time   CHOL 158 12/21/2020 0854   TRIG 183.0 (H) 12/21/2020 0854   HDL 38.00 (L) 12/21/2020 0854   CHOLHDL 4 12/21/2020 0854   VLDL 36.6 12/21/2020 0854   LDLCALC 83 12/21/2020 0854   LDLDIRECT 113.0 09/22/2015 1137    Hepatic Function Latest Ref Rng & Units 01/11/2021 12/21/2020 04/06/2019  Total Protein 6.0 - 8.3 g/dL 7.8 7.4 7.2  Albumin 3.5 - 5.2 g/dL 4.4 4.4 4.3  AST 0 - 37 U/L 42(H) 38(H) 21  ALT 0 - 35 U/L 40(H) 33 21  Alk Phosphatase 39 - 117 U/L 47 47 46  Total Bilirubin 0.2 - 1.2 mg/dL 0.6 0.8 0.6    Lab Results  Component Value Date/Time   TSH 1.88 12/21/2020 08:54 AM   TSH 2.18 04/06/2019 10:06  AM    CBC Latest Ref Rng & Units 02/19/2019 09/09/2012 10/02/2011  WBC 4.0 - 10.5 K/uL 9.4 8.1 9.7  Hemoglobin 12.0 - 15.0 g/dL 12.7 14.0 12.5  Hematocrit 36.0 - 46.0 % 38.6 41.2 37.4  Platelets 150.0 - 400.0 K/uL 262.0 266.0 388    Lab Results  Component Value Date/Time   VD25OH 32.30 11/07/2016 09:10 AM   VD25OH 24.01 (L) 09/22/2015 11:37 AM    Clinical ASCVD: Yes  The 10-year ASCVD risk score Mikey Bussing DC Jr., et al., 2013) is: 57.6%   Values used to calculate the score:     Age: 77 years     Sex: Female     Is Non-Hispanic African American: No     Diabetic: Yes     Tobacco smoker: No     Systolic Blood Pressure: 060 mmHg     Is BP treated: Yes     HDL Cholesterol: 38 mg/dL     Total Cholesterol: 158 mg/dL      Social History   Tobacco Use  Smoking Status Never  Smokeless Tobacco Never   BP Readings from Last 3 Encounters:  01/11/21 (!) 142/70  07/08/19 (!) 144/61  03/30/19 (!) 160/71   Pulse Readings from Last 3 Encounters:  01/11/21 73  07/08/19 84  03/30/19 71   Wt Readings from Last  3 Encounters:  01/11/21 204 lb 12.8 oz (92.9 kg)  03/11/20 180 lb (81.6 kg)  07/17/19 180 lb (81.6 kg)    Assessment: Review of patient past medical history, allergies, medications, health status, including review of consultants reports, laboratory and other test data, was performed as part of comprehensive evaluation and provision of chronic care management services.   SDOH:  (Social Determinants of Health) assessments and interventions performed:    CCM Care Plan  No Known Allergies  Medications Reviewed Today     Reviewed by De Hollingshead, RPH-CPP (Pharmacist) on 01/19/21 at 3  Med List Status: <None>   Medication Order Taking? Sig Documenting Provider Last Dose Status Informant  amLODipine (NORVASC) 5 MG tablet [id number] Yes Take 1 tablet (5 mg total) by mouth daily. Crecencio Mc, MD Taking Active   blood glucose meter kit and supplies [id number] Yes  Dispense based on patient and insurance preference. Use to check blood sugars twice daily. (FOR ICD-10 E11.69). Crecencio Mc, MD Taking Active   EUTHYROX 100 MCG tablet [id number] Yes TAKE 1 TABLET BY MOUTH ONCE DAILY BEFORE BREAKFAST Crecencio Mc, MD Taking Active   glipiZIDE (GLUCOTROL XL) 2.5 MG 24 hr tablet [id number] Yes Take 1 tablet (2.5 mg total) by mouth daily with breakfast. Crecencio Mc, MD Taking Active   Lancets (ONETOUCH DELICA PLUS TJQZES92Z) [place] [id number]  USE TO TEST BLOOD SUGAR TWICE DAILY [provider]  Active   metoprolol succinate (TOPROL-XL) 25 MG 24 hr tablet [id number] Yes TAKE 1 TABLET BY MOUTH AT BEDTIME Crecencio Mc, MD Taking Active   Omega-3 Fatty Acids (FISH OIL) 1000 MG CAPS [id number] Yes Take 1 capsule by mouth daily. [provider] Taking Active   [facility] VERIO test strip [id number] Yes USE  TO CHECK GLUCOSE UP TO TWICE DAILY Crecencio Mc, MD Taking Active   simvastatin (ZOCOR) 40 MG tablet [id number] Yes Take 1 tablet by mouth once daily Crecencio Mc, MD Taking Active   telmisartan (MICARDIS) 80 MG tablet [id number] No Take 1 tablet by mouth once daily  Patient not taking: Reported on 01/19/2021   Crecencio Mc, MD Not Taking Active             Patient Active Problem List   Diagnosis Date Noted   Hyperkalemia 01/16/2021   Personal history of colonic polyps    Polyp of transverse colon    Vitamin D deficiency 09/24/2015   S/P TAH-BSO (total abdominal hysterectomy and bilateral salpingo-oophorectomy) 03/17/2014   NAFLD (nonalcoholic fatty liver disease) 12/10/2012   Type 2 diabetes mellitus with stage 3 chronic kidney disease ([place]) 12/10/2012   Encounter for preventive health examination 09/09/2012   History of pneumonia 10/14/2011   Obesity (BMI 30-39.9) 10/14/2011   Secondary esophageal varices without bleeding ([place]) 10/14/2011   Tubular adenoma of colon    Essential hypertension 09/24/2011   Hypothyroidism  09/24/2011   Hyperlipidemia     Immunization History  Administered Date(s) Administered   Influenza, High Dose Seasonal PF 03/24/2015, 03/05/2018   Influenza,inj,Quad PF,6+ Mos 07/01/2013   Pneumococcal Conjugate-13 03/17/2014   Pneumococcal Polysaccharide-23 07/02/2011   Tdap 10/10/2012    Conditions to be addressed/monitored: HTN, HLD, and DMII  Care Plan : Medication Management  Updates made by De Hollingshead, RPH-CPP since 02/01/2021 12:00 AM     Problem: Diabetes, HTN, HLD      Long-Range Goal: Disease Progression Prevention   Start Date: 01/19/2021  Recent Progress: On track  Priority:  High  Note:   Current Barriers:  Unable to independently afford treatment regimen Unable to achieve control of diabetes   Pharmacist Clinical Goal(s):  Over the next 90 days, patient will achieve control of diabetes as evidenced by A1c  through collaboration with PharmD and provider.   Interventions: 1:1 collaboration with Crecencio Mc, MD regarding development and update of comprehensive plan of care as evidenced by provider attestation and co-signature Inter-disciplinary care team collaboration (see longitudinal plan of care) Comprehensive medication review performed; medication list updated in electronic medical record  SDOH: Widowed. Sons. Lives alone  Diabetes: Uncontrolled; current treatment: glipizide XL 2.5 mg QAM, Ozempic 0.25 mg daily - starting today Hx metformin - stopped d/t diarrhea, kidney function Hx Januvia - does not remember ever taking Current glucose readings: fasting glucose: not checking, post prandial glucose: 145-200 Current meal patterns: breakfast: often skips, sometimes protein bar, water; lunch: vegetables, salad, sometimes proteins, trying to eat more chicken than lean meals, minimizes carbs; dinner: sometimes skips if lunch is a bigger meal; sometimes fruits; drinks: water w/ no sugar flavoring Current exercise: not a lot lately; but is  thinking about joining the gym Received patient portion of Ozempic application. Submitted to [employer]  Hypertension: Moderately well controlled; current treatment: metoprolol succinate 25 mg daily, amlodipine 5 mg daily Telmisartan 80 mg daily ON HOLD due to hyperkalemia. Current home readings: 137/69 most recent reading. Denies any LEE since starting amlodipine Previously recommend to continue current regimen at this time. Follow BMP results for ability to re-initiate lower telmisartan dose  Hyperlipidemia: Controlled at goal <100 current treatment: simvastatin 40 mg daily;  Medications previously tried: none If higher doses of amlodipine required moving forward, recommend discontinuing simvastatin and starting atorvastatin (given renal function, dosing of rosuvastatin would be limited) to avoid drug interaction with simvastatin and high dose amlodipine Recommended to continue current regimen at this time  Hypothyroidism: Controlled per last lab work; current regimen: levothyroxine 100 mcg daily Continue current regimen at this time  Patient Goals/Self-Care Activities Over the next 90 days, patient will:  - take medications as prescribed check glucose twice daily, document, and provide at future appointments check blood pressure periodically, document, and provide at future appointments collaborate with provider on medication access solutions  Follow Up Plan: Telephone follow up appointment with care management team member scheduled for: ~4 weeks      Medication Assistance: Application for Ozempic  medication assistance program. in process.  Anticipated assistance start date TBD.  See plan of care for additional detail.  Patient's preferred pharmacy is:  [facility] [address], [place] - [place] [address] Phone: [phone] Fax: [phone]  [facility] [place], [place] [address] Phone: [phone] Fax: [phone]   Follow Up:  Patient agrees to Care Plan and Follow-up.  Plan: Telephone follow up appointment with care management team member scheduled for:  ~4 weeks  [name] Darnelle Maffucci, PharmD, [place], [place] Clinical Pharmacist [employer] at [employer] [phone]

## 2021-02-02 ENCOUNTER — Telehealth: Payer: Self-pay | Admitting: Pharmacy Technician

## 2021-02-02 DIAGNOSIS — Z596 Low income: Secondary | ICD-10-CM

## 2021-02-02 DIAGNOSIS — Z59868 Other specified financial insecurity: Secondary | ICD-10-CM

## 2021-02-02 NOTE — Progress Notes (Signed)
[facility])                                            [place] Team    02/02/2021  Marissa Rodriguez [birth year] [id number]                                      Medication Assistance Referral  Referral From: [place] RPh [name] T.   Medication/Company: [name] Patient application portion:  N/A patient signed in clinic Provider application portion:  N/A provider signed in clinic  to Dr. Deborra Medina Provider address/fax verified via: Office website  Received both patient and provider portion(s) of patient assistance application(s) for Ozempic. Faxed completed application and required documents into [employer].   Marissa Rodriguez, [place]  [phone]

## 2021-02-03 ENCOUNTER — Other Ambulatory Visit: Payer: Self-pay | Admitting: Internal Medicine

## 2021-02-03 DIAGNOSIS — Z1231 Encounter for screening mammogram for malignant neoplasm of breast: Secondary | ICD-10-CM

## 2021-02-06 ENCOUNTER — Telehealth: Payer: Self-pay | Admitting: Pharmacy Technician

## 2021-02-06 DIAGNOSIS — Z596 Low income: Secondary | ICD-10-CM

## 2021-02-06 DIAGNOSIS — Z59868 Other specified financial insecurity: Secondary | ICD-10-CM

## 2021-02-06 NOTE — Progress Notes (Signed)
[facility])                                            [place] Team    02/06/2021  Marissa Rodriguez 1941-03-29 [id number]  Care coordination call placed to [place] in regards to patient's application for Ozempic.  Spoke to [place] who informed patient was APPROVED 02/03/21-06/24/21. She informed a 120 days supply would be delivered to the provider's office in the next 10-15 business days.  Shannel Zahm P. Hodaya Curto, [place]  [phone]

## 2021-02-19 ENCOUNTER — Other Ambulatory Visit: Payer: Self-pay | Admitting: Internal Medicine

## 2021-02-19 DIAGNOSIS — E782 Mixed hyperlipidemia: Secondary | ICD-10-CM

## 2021-02-20 ENCOUNTER — Other Ambulatory Visit: Payer: Self-pay | Admitting: Internal Medicine

## 2021-02-20 DIAGNOSIS — E034 Atrophy of thyroid (acquired): Secondary | ICD-10-CM

## 2021-02-21 ENCOUNTER — Other Ambulatory Visit: Payer: Self-pay

## 2021-02-21 ENCOUNTER — Ambulatory Visit
Admission: RE | Admit: 2021-02-21 | Discharge: 2021-02-21 | Disposition: A | Discharge status: Home / Self Care | Payer: Medicare HMO | Source: Ambulatory Visit | Attending: Nephrology | Admitting: Nephrology

## 2021-02-21 DIAGNOSIS — N1832 Chronic kidney disease, stage 3b: Secondary | ICD-10-CM | POA: Diagnosis not present

## 2021-02-21 DIAGNOSIS — E1122 Type 2 diabetes mellitus with diabetic chronic kidney disease: Secondary | ICD-10-CM | POA: Diagnosis present

## 2021-02-23 ENCOUNTER — Other Ambulatory Visit: Payer: Self-pay

## 2021-02-23 ENCOUNTER — Ambulatory Visit
Admission: RE | Admit: 2021-02-23 | Discharge: 2021-02-23 | Disposition: A | Discharge status: Home / Self Care | Payer: Medicare HMO | Source: Ambulatory Visit | Attending: Internal Medicine | Admitting: Internal Medicine

## 2021-02-23 DIAGNOSIS — R809 Proteinuria, unspecified: Secondary | ICD-10-CM | POA: Diagnosis not present

## 2021-02-23 DIAGNOSIS — Z1231 Encounter for screening mammogram for malignant neoplasm of breast: Secondary | ICD-10-CM | POA: Diagnosis present

## 2021-02-23 DIAGNOSIS — I10 Essential (primary) hypertension: Secondary | ICD-10-CM | POA: Diagnosis not present

## 2021-02-23 DIAGNOSIS — E875 Hyperkalemia: Secondary | ICD-10-CM | POA: Diagnosis not present

## 2021-02-23 DIAGNOSIS — N1831 Chronic kidney disease, stage 3a: Secondary | ICD-10-CM | POA: Diagnosis not present

## 2021-02-23 DIAGNOSIS — E1122 Type 2 diabetes mellitus with diabetic chronic kidney disease: Secondary | ICD-10-CM | POA: Diagnosis not present

## 2021-03-01 DIAGNOSIS — N1831 Chronic kidney disease, stage 3a: Secondary | ICD-10-CM | POA: Diagnosis not present

## 2021-03-01 DIAGNOSIS — E875 Hyperkalemia: Secondary | ICD-10-CM | POA: Diagnosis not present

## 2021-03-08 ENCOUNTER — Telehealth: Payer: Self-pay | Admitting: Pharmacist

## 2021-03-08 ENCOUNTER — Ambulatory Visit (INDEPENDENT_AMBULATORY_CARE_PROVIDER_SITE_OTHER): Payer: Medicare HMO | Admitting: Pharmacist

## 2021-03-08 DIAGNOSIS — E034 Atrophy of thyroid (acquired): Secondary | ICD-10-CM

## 2021-03-08 DIAGNOSIS — E782 Mixed hyperlipidemia: Secondary | ICD-10-CM

## 2021-03-08 DIAGNOSIS — N1832 Chronic kidney disease, stage 3b: Secondary | ICD-10-CM

## 2021-03-08 DIAGNOSIS — E1121 Type 2 diabetes mellitus with diabetic nephropathy: Secondary | ICD-10-CM

## 2021-03-08 DIAGNOSIS — I10 Essential (primary) hypertension: Secondary | ICD-10-CM

## 2021-03-08 MED ORDER — ONETOUCH VERIO VI STRP: ORAL_STRIP | 3 refills | Status: DC

## 2021-03-08 MED ORDER — OZEMPIC (0.25 OR 0.5 MG/DOSE) 2 MG/1.5ML ~~LOC~~ SOPN: 0.5000 mg | PEN_INJECTOR | SUBCUTANEOUS | 0 refills | Status: DC

## 2021-03-08 NOTE — Chronic Care Management (AMB) (Signed)
Chronic Care Management Pharmacy Note  03/08/2021 Name:  Marissa Rodriguez MRN:  [id number] DOB:  1940/11/07   Subjective: Marissa Rodriguez is an 80 y.o. year old female who is a primary patient of Derrel Nip, Aris Everts, MD.  The CCM team was consulted for assistance with disease management and care coordination needs.    Engaged with patient by telephone for follow up visit in response to provider referral for pharmacy case management and/or care coordination services.   Consent to Services:  The patient was given information about Chronic Care Management services, agreed to services, and gave verbal consent prior to initiation of services.  Please see initial visit note for detailed documentation.   Patient Care Team: Crecencio Mc, MD as PCP - General (Internal Medicine) De Hollingshead, RPH-CPP (Pharmacist)   Objective:  Lab Results  Component Value Date   CREATININE 1.12 02/01/2021   CREATININE 1.29 (H) 01/11/2021   CREATININE 1.30 (H) 12/21/2020    Lab Results  Component Value Date   HGBA1C 8.1 (H) 12/21/2020   Last diabetic Eye exam:  Lab Results  Component Value Date/Time   HMDIABEYEEXA Retinopathy (A) 07/20/2020 12:00 AM    Last diabetic Foot exam:  Lab Results  Component Value Date/Time   HMDIABFOOTEX normal 01/11/2021 12:00 AM        Component Value Date/Time   CHOL 158 12/21/2020 0854   TRIG 183.0 (H) 12/21/2020 0854   HDL 38.00 (L) 12/21/2020 0854   CHOLHDL 4 12/21/2020 0854   VLDL 36.6 12/21/2020 0854   LDLCALC 83 12/21/2020 0854   LDLDIRECT 113.0 09/22/2015 1137    Hepatic Function Latest Ref Rng & Units 01/11/2021 12/21/2020 04/06/2019  Total Protein 6.0 - 8.3 g/dL 7.8 7.4 7.2  Albumin 3.5 - 5.2 g/dL 4.4 4.4 4.3  AST 0 - 37 U/L 42(H) 38(H) 21  ALT 0 - 35 U/L 40(H) 33 21  Alk Phosphatase 39 - 117 U/L 47 47 46  Total Bilirubin 0.2 - 1.2 mg/dL 0.6 0.8 0.6    Lab Results  Component Value Date/Time   TSH 1.88 12/21/2020 08:54 AM   TSH 2.18  04/06/2019 10:06 AM    CBC Latest Ref Rng & Units 02/19/2019 09/09/2012 10/02/2011  WBC 4.0 - 10.5 K/uL 9.4 8.1 9.7  Hemoglobin 12.0 - 15.0 g/dL 12.7 14.0 12.5  Hematocrit 36.0 - 46.0 % 38.6 41.2 37.4  Platelets 150.0 - 400.0 K/uL 262.0 266.0 388    Lab Results  Component Value Date/Time   VD25OH 32.30 11/07/2016 09:10 AM   VD25OH 24.01 (L) 09/22/2015 11:37 AM    Clinical ASCVD: No  The 10-year ASCVD risk score (Arnett DK, et al., 2019) is: 65.4%   Values used to calculate the score:     Age: 73 years     Sex: Female     Is Non-Hispanic African American: No     Diabetic: Yes     Tobacco smoker: No     Systolic Blood Pressure: 008 mmHg     Is BP treated: Yes     HDL Cholesterol: 38 mg/dL     Total Cholesterol: 158 mg/dL    Social History   Tobacco Use  Smoking Status Never  Smokeless Tobacco Never   BP Readings from Last 3 Encounters:  01/11/21 (!) 142/70  07/08/19 (!) 144/61  03/30/19 (!) 160/71   Pulse Readings from Last 3 Encounters:  01/11/21 73  07/08/19 84  03/30/19 71   Wt Readings from Last 3  Encounters:  01/11/21 204 lb 12.8 oz (92.9 kg)  03/11/20 180 lb (81.6 kg)  07/17/19 180 lb (81.6 kg)    Assessment: Review of patient past medical history, allergies, medications, health status, including review of consultants reports, laboratory and other test data, was performed as part of comprehensive evaluation and provision of chronic care management services.   SDOH:  (Social Determinants of Health) assessments and interventions performed:  SDOH Interventions    Flowsheet Row Most Recent Value  SDOH Interventions   Financial Strain Interventions Other (Comment)  [manufacturer assistance]       CCM Care Plan  No Known Allergies  Medications Reviewed Today     Reviewed by De Hollingshead, RPH-CPP (Pharmacist) on 03/08/21 at 0905  Med List Status: <None>   Medication Order Taking? Sig Documenting Provider Last Dose Status Informant  amLODipine  (NORVASC) 5 MG tablet [id number] Yes Take 1 tablet (5 mg total) by mouth daily. Crecencio Mc, MD Taking Active   blood glucose meter kit and supplies [id number] Yes Dispense based on patient and insurance preference. Use to check blood sugars twice daily. (FOR ICD-10 E11.69). Crecencio Mc, MD Taking Active   EUTHYROX 100 MCG tablet [id number] Yes TAKE 1 TABLET BY MOUTH ONCE DAILY BEFORE BREAKFAST Crecencio Mc, MD Taking Active   glipiZIDE (GLUCOTROL XL) 2.5 MG 24 hr tablet [id number] Yes Take 1 tablet (2.5 mg total) by mouth daily with breakfast. Crecencio Mc, MD Taking Active   Lancets (ONETOUCH DELICA PLUS [id number]) [place] [id number] Yes USE TO TEST BLOOD SUGAR TWICE DAILY [provider] Taking Active   metoprolol succinate (TOPROL-XL) 25 MG 24 hr tablet [id number] Yes TAKE 1 TABLET BY MOUTH AT BEDTIME Crecencio Mc, MD Taking Active   Omega-3 Fatty Acids (FISH OIL) 1000 MG CAPS [id number] Yes Take 1 capsule by mouth daily. [provider] Taking Active   [facility] VERIO test strip [id number] Yes USE  TO CHECK GLUCOSE UP TO TWICE DAILY Crecencio Mc, MD Taking Active   Semaglutide,0.25 or 0.5MG/DOS, (OZEMPIC, 0.25 OR 0.5 MG/DOSE,) 2 MG/1.5ML SOPN [id number] Yes Inject 0.25 mg weekly for 4 weeks then increase to 0.5 mg weekly Crecencio Mc, MD Taking Active   simvastatin (ZOCOR) 40 MG tablet [id number] Yes Take 1 tablet by mouth once daily Crecencio Mc, MD Taking Active   telmisartan (MICARDIS) 80 MG tablet [id number] Yes Take 1 tablet by mouth once daily Crecencio Mc, MD Taking Active             Patient Active Problem List   Diagnosis Date Noted   Hyperkalemia 01/16/2021   Personal history of colonic polyps    Polyp of transverse colon    Vitamin D deficiency 09/24/2015   S/P TAH-BSO (total abdominal hysterectomy and bilateral salpingo-oophorectomy) 03/17/2014   NAFLD (nonalcoholic fatty liver disease) 12/10/2012   Type 2 diabetes mellitus with stage 3  chronic kidney disease ([place]) 12/10/2012   Encounter for preventive health examination 09/09/2012   History of pneumonia 10/14/2011   Obesity (BMI 30-39.9) 10/14/2011   Secondary esophageal varices without bleeding ([place]) 10/14/2011   Tubular adenoma of colon    Essential hypertension 09/24/2011   Hypothyroidism 09/24/2011   Hyperlipidemia     Immunization History  Administered Date(s) Administered   Influenza, High Dose Seasonal PF 03/24/2015, 03/05/2018   Influenza,inj,Quad PF,6+ Mos 07/01/2013   Pneumococcal Conjugate-13 03/17/2014   Pneumococcal Polysaccharide-23 07/02/2011   Tdap 10/10/2012    Conditions to be addressed/monitored: HTN, HLD,  and DMII  Care Plan : Medication Management  Updates made by De Hollingshead, RPH-CPP since 03/08/2021 12:00 AM     Problem: Diabetes, HTN, HLD      Long-Range Goal: Disease Progression Prevention   Start Date: 01/19/2021  This Visit's Progress: On track  Recent Progress: On track  Priority: High  Note:   Current Barriers:  Unable to independently afford treatment regimen Unable to achieve control of diabetes   Pharmacist Clinical Goal(s):  Over the next 90 days, patient will achieve control of diabetes as evidenced by A1c  through collaboration with PharmD and provider.   Interventions: 1:1 collaboration with Crecencio Mc, MD regarding development and update of comprehensive plan of care as evidenced by provider attestation and co-signature Inter-disciplinary care team collaboration (see longitudinal plan of care) Comprehensive medication review performed; medication list updated in electronic medical record  SDOH: Widowed. Sons. Lives alone  Health Maintenance Yearly diabetic eye exam: up to date Yearly diabetic foot exam: up to date Urine microalbumin: up to date Yearly influenza vaccination: due - recommended that she pursue at her local pharmacy Td/Tdap vaccination: up to date Pneumonia vaccination: up to  date COVID vaccinations: due - 0 doses. Recommended she consider, and let me know if she would like to discuss moving forward  Shingrix vaccinations: due - notes that she plans to get at her local pharmacy moving forward Colonoscopy: up to date Bone density scan: up to date Mammogram: up to date Will be due for PCP f/u at the end of October. Assisted patient in scheduling f/u in November .  Diabetes: Uncontrolled; current treatment: glipizide XL 2.5 mg QAM, Ozempic 0.5 mg weekly APPROVED for Ozempic assistance through [employer] through 05/24/21 Hx metformin - stopped d/t diarrhea, kidney function Hx Januvia - does not remember ever taking Current glucose readings: fasting glucose: 96-130 post prandial glucose: has checked at night sometimes; usually ~120-130 Does report an episode of low blood sugars - was in the 50s mid-morning Current meal patterns: Ozempic has helped cut back on late night snacking.  Stop glipizide due to hypoglycemia. Continue Ozempic 0.5 mg weekly. Her order from [employer] is delayed due to back order. Will prepare sample for her to come pick up.  Recommend addition of Farxiga moving forward for CKD benefit. Patient would qualify for assistance from [employer].   Hypertension: Moderately well controlled; current treatment: metoprolol succinate 25 mg daily, amlodipine 5 mg daily, telmisartan 80 mg daily Lab work s/p telmisartan showed Scr WNL, potassium WNL Current home readings: 138/67, HR 63 Recommended to continue current regimen at this time. Reviewed goal BP <130/80  Hyperlipidemia: Controlled at goal <100, though a more stringent goal <70 could be considered given DM + risk factors of HTN, CKD; current treatment: simvastatin 40 mg daily;  Medications previously tried: none Drug interaction with simvastatin and amlodipine increasing risk of statin associated side effects. Doses of simvastatin >20 mg at not recommended to be used with amlodipine. Recommend  changing to rosuvastatin to avoid drug interactions. If eGFR drops to <30, rosuvastatin dose would need to be limited to 10 mg or changed to atorvastatin.  Discussed with patient today. She declined to make any changes. Encouraged to discuss w/ PCP moving forward  Recommended to continue current regimen at this time.   Hypothyroidism: Controlled per last lab work; current regimen: levothyroxine 100 mcg daily Continue current regimen at this time  Patient Goals/Self-Care Activities Over the next 90 days, patient will:  - take  medications as prescribed check glucose twice daily, document, and provide at future appointments check blood pressure periodically, document, and provide at future appointments collaborate with provider on medication access solutions  Follow Up Plan: Telephone follow up appointment with care management team member scheduled for: ~10 weeks      Medication Assistance:  Ozempic obtained through [employer] medication assistance program.  Enrollment ends 05/24/21  Patient's preferred pharmacy is:  [facility], [place] - [place] [address] Phone: [phone] Fax: [phone]  [facility] ([place]) [place], [place] [address] Phone: [phone] Fax: [phone]    Follow Up:  Patient agrees to Care Plan and Follow-up.  Plan: Telephone follow up appointment with care management team member scheduled for:  ~ 10 weeks  [name] Darnelle Maffucci, PharmD, [place], [place] Clinical Pharmacist [employer] at [employer] [phone]

## 2021-03-08 NOTE — Patient Instructions (Signed)
[place],   It was great talking to you today!  Stop glipizide. Continue Ozempic 0.5 mg weekly. Check your blood sugars twice daily:  1) Fasting, first thing in the morning before breakfast and  2) 2 hours after a meal, alternating between different meals.   For a goal A1c of less than 7%, goal fasting readings are less than 130 and goal 2 hour after meal readings are less than 180.   Goal blood pressure is less than 130/80. I would recommend continuing to check blood pressures periodically at home.   I recommend you change to a different statin medication that does not interact with amlodipine. I will let Dr. Derrel Nip know this recommendation.   Let us know when you get your Shingles vaccines and your influenza vaccine.   Thanks!  [name] Darnelle Maffucci, PharmD [phone]  Visit Information  PATIENT GOALS:  Goals Addressed               This Visit's Progress     Patient Stated     Medication Monitoring (pt-stated)        Patient Goals/Self-Care Activities Over the next 90 days, patient will:  - take medications as prescribed check glucose twice daily, document, and provide at future appointments check blood pressure periodically, document, and provide at future appointments collaborate with provider on medication access solutions         Print copy of patient instructions, educational materials, and care plan provided in person.   Plan: Telephone follow up appointment with care management team member scheduled for:  ~ 10 weeks  [name] Darnelle Maffucci, PharmD, [place], [place] Clinical Pharmacist [employer] at [employer] [phone]

## 2021-03-08 NOTE — Telephone Encounter (Signed)
Patient picked up sample 

## 2021-03-08 NOTE — Telephone Encounter (Signed)
Medication Samples have been labeled and logged for the patient. She plans to come pick up today or tomorrow  Drug name: Ozempic       Strength: 2 mg/1.5 mL        Qty: 1 pen  LOT: [id number]  Exp.Date: 08/23/23  Dosing instructions: Inject 0.5 mg weekly  The patient has been instructed regarding the correct time, dose, and frequency of taking this medication, including desired effects and most common side effects.   De Hollingshead 9:39 AM 03/08/2021

## 2021-03-09 ENCOUNTER — Other Ambulatory Visit: Payer: Self-pay | Admitting: Internal Medicine

## 2021-03-09 DIAGNOSIS — E1121 Type 2 diabetes mellitus with diabetic nephropathy: Secondary | ICD-10-CM

## 2021-03-14 ENCOUNTER — Ambulatory Visit (INDEPENDENT_AMBULATORY_CARE_PROVIDER_SITE_OTHER): Payer: Medicare HMO

## 2021-03-14 VITALS — Ht 65.98 in | Wt 204.0 lb

## 2021-03-14 DIAGNOSIS — Z0000 Encounter for general adult medical examination without abnormal findings: Secondary | ICD-10-CM | POA: Diagnosis not present

## 2021-03-14 NOTE — Progress Notes (Signed)
Subjective:   Joslin W Martinique is a 80 y.o. female who presents for Medicare Annual (Subsequent) preventive examination.  Review of Systems    No ROS.  Medicare Wellness Virtual Visit.  Visual/audio telehealth visit, UTA vital signs.   See social history for additional risk factors.   Cardiac Risk Factors include: advanced age (>77mn, >>1women);diabetes mellitus;hypertension     Objective:    Today's Vitals   03/14/21 0949  Weight: 204 lb (92.5 kg)  Height: 5' 5.98" (1.676 m)   Body mass index is 32.95 kg/m.  Advanced Directives 03/14/2021 03/11/2020 03/19/2019 03/09/2019 03/05/2018 07/22/2015  Does Patient Have a Medical Advance Directive? No No No No No No  Would patient like information on creating a medical advance directive? No - Patient declined No - Patient declined No - Patient declined No - Patient declined Yes (MAU/Ambulatory/Procedural Areas - Information given) No - patient declined information    Current Medications (verified) Outpatient Encounter Medications as of 03/14/2021  Medication Sig   amLODipine (NORVASC) 5 MG tablet Take 1 tablet (5 mg total) by mouth daily.   blood glucose meter kit and supplies Dispense based on patient and insurance preference. Use to check blood sugars twice daily. (FOR ICD-10 E11.69).   EUTHYROX 100 MCG tablet TAKE 1 TABLET BY MOUTH ONCE DAILY BEFORE BREAKFAST   glucose blood (ONETOUCH VERIO) test strip USE TO TEST BLOOD SUGAR UP TO TWICE DAILY   Lancets (ONETOUCH DELICA PLUS [id number] MISC USE TO TEST BLOOD SUGAR TWICE DAILY   metoprolol succinate (TOPROL-XL) 25 MG 24 hr tablet TAKE 1 TABLET BY MOUTH AT BEDTIME   Omega-3 Fatty Acids (FISH OIL) 1000 MG CAPS Take 1 capsule by mouth daily.   Semaglutide,0.25 or 0.5MG/DOS, (OZEMPIC, 0.25 OR 0.5 MG/DOSE,) 2 MG/1.5ML SOPN Inject 0.5 mg into the skin once a week. Inject 0.25 mg weekly for 4 weeks then increase to 0.5 mg weekly   simvastatin (ZOCOR) 40 MG tablet Take 1 tablet by mouth once  daily   telmisartan (MICARDIS) 80 MG tablet Take 1 tablet by mouth once daily   No facility-administered encounter medications on file as of 03/14/2021.    Allergies (verified) Patient has no known allergies.   History: Past Medical History:  Diagnosis Date   Closed fracture of part of upper end of humerus 09/24/2015   Diabetes mellitus without complication ([facility]    History of MRI of brain and brain stem May 2012   normal MRI/MRA done to rule out CVA   Hyperlipidemia    Hypertension    Hypothyroidism    Tubular adenoma of colon Nov 2010   repeat due 2015   Past Surgical History:  Procedure Laterality Date   ABDOMINAL HYSTERECTOMY  1975   TAH/LSO, wedge resection of right   APPENDECTOMY  1975   incidental, done during TAH   COLONOSCOPY WITH PROPOFOL N/A 03/19/2019   Procedure: COLONOSCOPY WITH BIOPSIES;  Surgeon: WLucilla Lame MD;  Location: [facility]  Service: Endoscopy;  Laterality: N/A;  Diabetic   POLYPECTOMY N/A 03/19/2019   Procedure: POLYPECTOMY;  Surgeon: WLucilla Lame MD;  Location[facility]  Service: Endoscopy;  Laterality: N/A;   Family History  Problem Relation Age of Onset   Cancer Father        Lung    Breast cancer Sister 759  Social History   Socioeconomic History   Marital status: Widowed    Spouse name: Not on file   Number of children: Not on file  Years of education: Not on file   Highest education level: Not on file  Occupational History   Not on file  Tobacco Use   Smoking status: Never   Smokeless tobacco: Never  Vaping Use   Vaping Use: Never used  Substance and Sexual Activity   Alcohol use: No   Drug use: No   Sexual activity: Not on file  Other Topics Concern   Not on file  Social History Narrative   Not on file   Social Determinants of Health   Financial Resource Strain: Medium Risk   Difficulty of Paying Living Expenses: Somewhat hard  Food Insecurity: No Food Insecurity   Worried About Running Out of  Food in the Last Year: Never true   Ran Out of Food in the Last Year: Never true  Transportation Needs: No Transportation Needs   Lack of Transportation (Medical): No   Lack of Transportation (Non-Medical): No  Physical Activity: Not on file  Stress: No Stress Concern Present   Feeling of Stress : Not at all  Social Connections: Not on file    Tobacco Counseling Counseling given: Not Answered   Clinical Intake:  Pre-visit preparation completed: Yes        Diabetes: Yes (Followed by pcp)  How often do you need to have someone help you when you read instructions, pamphlets, or other written materials from your doctor or pharmacy?: 1 - Never  Financial Strains and Diabetes Management: Are you having any financial strains with the device, your supplies or your medication? No .  Is the patient be seen by Chronic Care Management for management of their diabetes?  Yes  Reports FBS 135-140. BS 2 hours after meals <180.   Activities of Daily Living In your present state of health, do you have any difficulty performing the following activities: 03/14/2021  Hearing? N  Vision? N  Difficulty concentrating or making decisions? N  Walking or climbing stairs? N  Dressing or bathing? N  Doing errands, shopping? N  Preparing Food and eating ? N  Using the Toilet? N  In the past six months, have you accidently leaked urine? N  Do you have problems with loss of bowel control? N  Managing your Medications? N  Managing your Finances? N  Housekeeping or managing your Housekeeping? N  Some recent data might be hidden    Patient Care Team: Crecencio Mc, MD as PCP - General (Internal Medicine) De Hollingshead, RPH-CPP (Pharmacist)  Indicate any recent Medical Services you may have received from other than [facility] providers in the past year (date may be approximate).     Assessment:   This is a routine wellness examination for Carlton.  I connected with Laylaa today by telephone and  verified that I am speaking with the correct person using two identifiers. Location patient: home Location provider: work Persons participating in the virtual visit: patient, Marine scientist.    I discussed the limitations, risks, security and privacy concerns of performing an evaluation and management service by telephone and the availability of in person appointments. The patient expressed understanding and verbally consented to this telephonic visit.    Interactive audio and video telecommunications were attempted between this provider and patient, however failed, due to patient having technical difficulties OR patient did not have access to video capability.  We continued and completed visit with audio only.  Some vital signs may be absent or patient reported.   Hearing/Vision screen Hearing Screening - Comments:: Patient is able to  hear conversational tones without difficulty.  No issues reported. Vision Screening - Comments:: Followed by Dr. Zigmund Daniel No retinopathy reported  Cataract extraction, bilateral   Dietary issues and exercise activities discussed: Current Exercise Habits: Home exercise routine, Intensity: Mild Healthy diet   Goals Addressed             This Visit's Progress    Increase physical activity       I would like to start walking again for exercise        Depression Screen PHQ 2/9 Scores 03/14/2021 01/11/2021 03/11/2020 07/17/2019 03/09/2019 03/05/2018 01/22/2018  PHQ - 2 Score 0 0 0 0 0 0 0  PHQ- 9 Score - - - 0 - 0 1    Fall Risk Fall Risk  03/14/2021 01/11/2021 03/11/2020 07/17/2019 03/30/2019  Falls in the past year? 0 1 0 0 0  Number falls in past yr: - 1 0 0 -  Injury with Fall? 0 0 - 0 -  Follow up Falls evaluation completed Falls evaluation completed Falls evaluation completed Falls evaluation completed Falls evaluation completed    Gabbs: Adequate lighting in your home to reduce risk of falls? Yes   ASSISTIVE DEVICES  UTILIZED TO PREVENT FALLS: Use of a cane, walker or w/c? No   TIMED UP AND GO: Was the test performed? No .   Cognitive Function: Patient is alert and oriented x3.  Manages own finances, likes to read and occasionally complete cross word puzzles.  MMSE - Mini Mental State Exam 03/05/2018  Orientation to time 5  Orientation to Place 5  Registration 3  Recall 3  Language- repeat 1  Language- read & follow direction 1  Copy design 1     6CIT Screen 03/09/2019  What Year? 0 points  What month? 0 points  What time? 0 points  Count back from 20 0 points  Months in reverse 0 points    Immunizations Immunization History  Administered Date(s) Administered   Influenza, High Dose Seasonal PF 03/24/2015, 03/05/2018   Influenza,inj,Quad PF,6+ Mos 07/01/2013   Pneumococcal Conjugate-13 03/17/2014   Pneumococcal Polysaccharide-23 07/02/2011   Tdap 10/10/2012   Shingrix vaccine- Due, Education has been provided regarding the importance of this vaccine. Advised may receive this vaccine at local pharmacy or Health Dept. Aware to provide a copy of the vaccination record if obtained from local pharmacy or Health Dept. Verbalized acceptance and understanding. Deferred.   Flu Vaccine status: Due, Education has been provided regarding the importance of this vaccine. Advised may receive this vaccine at local pharmacy or Health Dept. Aware to provide a copy of the vaccination record if obtained from local pharmacy or Health Dept. Verbalized acceptance and understanding.  Covid vaccine- discontinued per patient.  Health Maintenance There are no preventive care reminders to display for this patient. Health Maintenance  Topic Date Due   Zoster Vaccines- Shingrix (1 of 2) 06/13/2021 (Originally 01/01/1991)   INFLUENZA VACCINE  09/22/2021 (Originally 01/23/2021)   HEMOGLOBIN A1C  06/22/2021   OPHTHALMOLOGY EXAM  07/20/2021   FOOT EXAM  01/11/2022   MAMMOGRAM  02/23/2022   TETANUS/TDAP  10/11/2022    COLONOSCOPY (Pts 45-34yr Insurance coverage will need to be confirmed)  03/18/2024   DEXA SCAN  Completed   Hepatitis C Screening  Completed   HPV VACCINES  Aged Out   COVID-19 Vaccine  Discontinued   Lung Cancer Screening: (Low Dose CT Chest recommended if Age 79-80years, 30 pack-year currently smoking  OR have quit w/in 15years.) does not qualify.   Vision Screening: Recommended annual ophthalmology exams for early detection of glaucoma and other disorders of the eye.  Dental Screening: Recommended annual dental exams for proper oral hygiene.  Community Resource Referral / Chronic Care Management: CRR required this visit?  No   CCM required this visit?  No      Plan:   Keep all routine maintenance appointments.   I have personally reviewed and noted the following in the patient's chart:   Medical and social history Use of alcohol, tobacco or illicit drugs  Current medications and supplements including opioid prescriptions. Not taking opioid.  Functional ability and status Nutritional status Physical activity Advanced directives List of other physicians Hospitalizations, surgeries, and ER visits in previous 12 months Vitals Screenings to include cognitive, depression, and falls Referrals and appointments  In addition, I have reviewed and discussed with patient certain preventive protocols, quality metrics, and best practice recommendations. A written personalized care plan for preventive services as well as general preventive health recommendations were provided to patient via mychart.     Varney Biles, LPN   0/60/1561

## 2021-03-14 NOTE — Patient Instructions (Addendum)
Marissa Rodriguez , Thank you for taking time to come for your Medicare Wellness Visit. I appreciate your ongoing commitment to your health goals. Please review the following plan we discussed and let me know if I can assist you in the future.   These are the goals we discussed:  Goals       Patient Stated     Healthy diet (pt-stated)      Medication Monitoring (pt-stated)      Patient Goals/Self-Care Activities Over the next 90 days, patient will:  - take medications as prescribed check glucose twice daily, document, and provide at future appointments check blood pressure periodically, document, and provide at future appointments collaborate with provider on medication access solutions       Other     Increase physical activity      I would like to start walking again for exercise         This is a list of the screening recommended for you and due dates:  Health Maintenance  Topic Date Due   Zoster (Shingles) Vaccine (1 of 2) 06/13/2021*   Flu Shot  09/22/2021*   Hemoglobin A1C  06/22/2021   Eye exam for diabetics  07/20/2021   Complete foot exam   01/11/2022   Mammogram  02/23/2022   Tetanus Vaccine  10/11/2022   Colon Cancer Screening  03/18/2024   DEXA scan (bone density measurement)  Completed   Hepatitis C Screening: USPSTF Recommendation to screen - Ages 44-79 yo.  Completed   HPV Vaccine  Aged Out   COVID-19 Vaccine  Discontinued  *Topic was postponed. The date shown is not the original due date.    Advanced directives: End of life planning; Advance aging; Advanced directives discussed.  Copy of current HCPOA/Living Will requested.    Conditions/risks identified: none new  Follow up in one year for your annual wellness visit    Preventive Care 65 Years and Older, Female Preventive care refers to lifestyle choices and visits with your health care provider that can promote health and wellness. What does preventive care include? A yearly physical exam. This is  also called an annual well check. Dental exams once or twice a year. Routine eye exams. Ask your health care provider how often you should have your eyes checked. Personal lifestyle choices, including: Daily care of your teeth and gums. Regular physical activity. Eating a healthy diet. Avoiding tobacco and drug use. Limiting alcohol use. Practicing safe sex. Taking low-dose aspirin every day. Taking vitamin and mineral supplements as recommended by your health care provider. What happens during an annual well check? The services and screenings done by your health care provider during your annual well check will depend on your age, overall health, lifestyle risk factors, and family history of disease. Counseling  Your health care provider may ask you questions about your: Alcohol use. Tobacco use. Drug use. Emotional well-being. Home and relationship well-being. Sexual activity. Eating habits. History of falls. Memory and ability to understand (cognition). Work and work Statistician. Reproductive health. Screening  You may have the following tests or measurements: Height, weight, and BMI. Blood pressure. Lipid and cholesterol levels. These may be checked every 5 years, or more frequently if you are over 24 years old. Skin check. Lung cancer screening. You may have this screening every year starting at age 68 if you have a 30-pack-year history of smoking and currently smoke or have quit within the past 15 years. Fecal occult blood test (FOBT) of the stool.  You may have this test every year starting at age 82. Flexible sigmoidoscopy or colonoscopy. You may have a sigmoidoscopy every 5 years or a colonoscopy every 10 years starting at age 29. Hepatitis C blood test. Hepatitis B blood test. Sexually transmitted disease (STD) testing. Diabetes screening. This is done by checking your blood sugar (glucose) after you have not eaten for a while (fasting). You may have this done every 1-3  years. Bone density scan. This is done to screen for osteoporosis. You may have this done starting at age 80. Mammogram. This may be done every 1-2 years. Talk to your health care provider about how often you should have regular mammograms. Talk with your health care provider about your test results, treatment options, and if necessary, the need for more tests. Vaccines  Your health care provider may recommend certain vaccines, such as: Influenza vaccine. This is recommended every year. Tetanus, diphtheria, and acellular pertussis (Tdap, Td) vaccine. You may need a Td booster every 10 years. Zoster vaccine. You may need this after age 56. Pneumococcal 13-valent conjugate (PCV13) vaccine. One dose is recommended after age 44. Pneumococcal polysaccharide (PPSV23) vaccine. One dose is recommended after age 65. Talk to your health care provider about which screenings and vaccines you need and how often you need them. This information is not intended to replace advice given to you by your health care provider. Make sure you discuss any questions you have with your health care provider. Document Released: 07/08/2015 Document Revised: 02/29/2016 Document Reviewed: 04/12/2015 Elsevier Interactive Patient Education  2017 [employer] Prevention in the Home Falls can cause injuries. They can happen to people of all ages. There are many things you can do to make your home safe and to help prevent falls. What can I do on the outside of my home? Regularly fix the edges of walkways and driveways and fix any cracks. Remove anything that might make you trip as you walk through a door, such as a raised step or threshold. Trim any bushes or trees on the path to your home. Use bright outdoor lighting. Clear any walking paths of anything that might make someone trip, such as rocks or tools. Regularly check to see if handrails are loose or broken. Make sure that both sides of any steps have  handrails. Any raised decks and porches should have guardrails on the edges. Have any leaves, snow, or ice cleared regularly. Use sand or salt on walking paths during winter. Clean up any spills in your garage right away. This includes oil or grease spills. What can I do in the bathroom? Use night lights. Install grab bars by the toilet and in the tub and shower. Do not use towel bars as grab bars. Use non-skid mats or decals in the tub or shower. If you need to sit down in the shower, use a plastic, non-slip stool. Keep the floor dry. Clean up any water that spills on the floor as soon as it happens. Remove soap buildup in the tub or shower regularly. Attach bath mats securely with double-sided non-slip rug tape. Do not have throw rugs and other things on the floor that can make you trip. What can I do in the bedroom? Use night lights. Make sure that you have a light by your bed that is easy to reach. Do not use any sheets or blankets that are too big for your bed. They should not hang down onto the floor. Have a firm chair that has  side arms. You can use this for support while you get dressed. Do not have throw rugs and other things on the floor that can make you trip. What can I do in the kitchen? Clean up any spills right away. Avoid walking on wet floors. Keep items that you use a lot in easy-to-reach places. If you need to reach something above you, use a strong step stool that has a grab bar. Keep electrical cords out of the way. Do not use floor polish or wax that makes floors slippery. If you must use wax, use non-skid floor wax. Do not have throw rugs and other things on the floor that can make you trip. What can I do with my stairs? Do not leave any items on the stairs. Make sure that there are handrails on both sides of the stairs and use them. Fix handrails that are broken or loose. Make sure that handrails are as long as the stairways. Check any carpeting to make sure  that it is firmly attached to the stairs. Fix any carpet that is loose or worn. Avoid having throw rugs at the top or bottom of the stairs. If you do have throw rugs, attach them to the floor with carpet tape. Make sure that you have a light switch at the top of the stairs and the bottom of the stairs. If you do not have them, ask someone to add them for you. What else can I do to help prevent falls? Wear shoes that: Do not have high heels. Have rubber bottoms. Are comfortable and fit you well. Are closed at the toe. Do not wear sandals. If you use a stepladder: Make sure that it is fully opened. Do not climb a closed stepladder. Make sure that both sides of the stepladder are locked into place. Ask someone to hold it for you, if possible. Clearly mark and make sure that you can see: Any grab bars or handrails. First and last steps. Where the edge of each step is. Use tools that help you move around (mobility aids) if they are needed. These include: Canes. Walkers. Scooters. Crutches. Turn on the lights when you go into a dark area. Replace any light bulbs as soon as they burn out. Set up your furniture so you have a clear path. Avoid moving your furniture around. If any of your floors are uneven, fix them. If there are any pets around you, be aware of where they are. Review your medicines with your doctor. Some medicines can make you feel dizzy. This can increase your chance of falling. Ask your doctor what other things that you can do to help prevent falls. This information is not intended to replace advice given to you by your health care provider. Make sure you discuss any questions you have with your health care provider. Document Released: 04/07/2009 Document Revised: 11/17/2015 Document Reviewed: 07/16/2014 Elsevier Interactive Patient Education  2017 [employer].

## 2021-03-22 ENCOUNTER — Telehealth: Payer: Self-pay

## 2021-03-22 NOTE — Telephone Encounter (Signed)
Received 5 boxes of ozempic for patient. She has been notified for pick up.

## 2021-03-24 DIAGNOSIS — E1121 Type 2 diabetes mellitus with diabetic nephropathy: Secondary | ICD-10-CM | POA: Diagnosis not present

## 2021-03-24 DIAGNOSIS — I10 Essential (primary) hypertension: Secondary | ICD-10-CM

## 2021-03-24 DIAGNOSIS — E782 Mixed hyperlipidemia: Secondary | ICD-10-CM | POA: Diagnosis not present

## 2021-03-24 DIAGNOSIS — E034 Atrophy of thyroid (acquired): Secondary | ICD-10-CM

## 2021-03-24 NOTE — Telephone Encounter (Signed)
Called patient again to remind of need to pick up. Asked to come by this morning, advised that we would be fully remote this afternoon. She notes she will try to come by this morning

## 2021-04-18 ENCOUNTER — Other Ambulatory Visit: Payer: Self-pay | Admitting: Internal Medicine

## 2021-04-26 ENCOUNTER — Ambulatory Visit (INDEPENDENT_AMBULATORY_CARE_PROVIDER_SITE_OTHER): Payer: Medicare HMO | Admitting: Internal Medicine

## 2021-04-26 ENCOUNTER — Encounter: Payer: Self-pay | Admitting: Internal Medicine

## 2021-04-26 ENCOUNTER — Other Ambulatory Visit: Payer: Self-pay

## 2021-04-26 ENCOUNTER — Ambulatory Visit: Payer: Medicare HMO | Admitting: Pharmacist

## 2021-04-26 VITALS — BP 124/66 | HR 76 | Temp 95.9°F | Ht 65.0 in | Wt 197.4 lb

## 2021-04-26 DIAGNOSIS — E1121 Type 2 diabetes mellitus with diabetic nephropathy: Secondary | ICD-10-CM

## 2021-04-26 DIAGNOSIS — E1122 Type 2 diabetes mellitus with diabetic chronic kidney disease: Secondary | ICD-10-CM | POA: Diagnosis not present

## 2021-04-26 DIAGNOSIS — N1832 Chronic kidney disease, stage 3b: Secondary | ICD-10-CM | POA: Diagnosis not present

## 2021-04-26 DIAGNOSIS — I10 Essential (primary) hypertension: Secondary | ICD-10-CM

## 2021-04-26 DIAGNOSIS — E782 Mixed hyperlipidemia: Secondary | ICD-10-CM

## 2021-04-26 DIAGNOSIS — K760 Fatty (change of) liver, not elsewhere classified: Secondary | ICD-10-CM | POA: Diagnosis not present

## 2021-04-26 LAB — COMPREHENSIVE METABOLIC PANEL WITH GFR
ALT: 19 U/L (ref 0–35)
AST: 21 U/L (ref 0–37)
Albumin: 4.6 g/dL (ref 3.5–5.2)
Alkaline Phosphatase: 50 U/L (ref 39–117)
BUN: 23 mg/dL (ref 6–23)
CO2: 26 meq/L (ref 19–32)
Calcium: 10.1 mg/dL (ref 8.4–10.5)
Chloride: 101 meq/L (ref 96–112)
Creatinine, Ser: 1.26 mg/dL — ABNORMAL HIGH (ref 0.40–1.20)
GFR: 40.66 mL/min — ABNORMAL LOW
Glucose, Bld: 121 mg/dL — ABNORMAL HIGH (ref 70–99)
Potassium: 4.6 meq/L (ref 3.5–5.1)
Sodium: 137 meq/L (ref 135–145)
Total Bilirubin: 0.7 mg/dL (ref 0.2–1.2)
Total Protein: 7.7 g/dL (ref 6.0–8.3)

## 2021-04-26 LAB — LIPID PANEL
Cholesterol: 152 mg/dL (ref 0–200)
HDL: 36.6 mg/dL — ABNORMAL LOW
LDL Cholesterol: 89 mg/dL (ref 0–99)
NonHDL: 115.17
Total CHOL/HDL Ratio: 4
Triglycerides: 133 mg/dL (ref 0.0–149.0)
VLDL: 26.6 mg/dL (ref 0.0–40.0)

## 2021-04-26 LAB — HEMOGLOBIN A1C: Hgb A1c MFr Bld: 6.6 % — ABNORMAL HIGH (ref 4.6–6.5)

## 2021-04-26 MED ORDER — OZEMPIC (0.25 OR 0.5 MG/DOSE) 2 MG/1.5ML ~~LOC~~ SOPN: 0.5000 mg | PEN_INJECTOR | SUBCUTANEOUS | 0 refills | Status: DC

## 2021-04-26 MED ORDER — TELMISARTAN 80 MG PO TABS: 80.0000 mg | ORAL_TABLET | Freq: Every day | ORAL | 3 refills | Status: DC

## 2021-04-26 NOTE — Progress Notes (Addendum)
Subjective:  Patient ID: Marissa Rodriguez, female    DOB: June 11, 1941  Age: 80 y.o. MRN: [id number]  CC: The primary encounter diagnosis was Type 2 diabetes mellitus with stage 3b chronic kidney disease, without long-term current use of insulin ([employer]). Diagnoses of Mixed hyperlipidemia, Type II diabetes mellitus with nephropathy ([place]), Essential hypertension, and NAFLD (nonalcoholic fatty liver disease) were also pertinent to this visit.  HPI Marissa Rodriguez presents for follow up on type 2 DM, with obesity and hypertension  Chief Complaint  Patient presents with   Follow-up    3 month follow up on diabetes   This visit occurred during the SARS-CoV-2 public health emergency.  Safety protocols were in place, including screening questions prior to the visit, additional usage of staff PPE, and extensive cleaning of exam room while observing appropriate contact time as indicated for disinfecting solutions.   DM:  cbg has not been  146 since starting ozempic and hse has lost 7 lbs.   [place] morning higher than evening but not by much.  And has not had any  afternoon  hypoglycemic episodes   Outpatient Medications Prior to Visit  Medication Sig Dispense Refill   amLODipine (NORVASC) 5 MG tablet Take 1 tablet by mouth once daily 30 tablet 0   blood glucose meter kit and supplies Dispense based on patient and insurance preference. Use to check blood sugars twice daily. (FOR ICD-10 E11.69). 1 each 0   EUTHYROX 100 MCG tablet TAKE 1 TABLET BY MOUTH ONCE DAILY BEFORE BREAKFAST 90 tablet 0   glucose blood (ONETOUCH VERIO) test strip USE TO TEST BLOOD SUGAR UP TO TWICE DAILY 100 each 0   Lancets (ONETOUCH DELICA PLUS [id number]) MISC USE TO TEST BLOOD SUGAR TWICE DAILY     metoprolol succinate (TOPROL-XL) 25 MG 24 hr tablet TAKE 1 TABLET BY MOUTH AT BEDTIME 90 tablet 0   Omega-3 Fatty Acids (FISH OIL) 1000 MG CAPS Take 1 capsule by mouth daily.     Semaglutide,0.25 or 0.5MG/DOS, (OZEMPIC, 0.25 OR 0.5  MG/DOSE,) 2 MG/1.5ML SOPN Inject 0.5 mg into the skin once a week. Inject 0.25 mg weekly for 4 weeks then increase to 0.5 mg weekly 1.5 mL 0   simvastatin (ZOCOR) 40 MG tablet Take 1 tablet by mouth once daily 90 tablet 0   telmisartan (MICARDIS) 80 MG tablet Take 1 tablet by mouth once daily 90 tablet 0   No facility-administered medications prior to visit.    Review of Systems;  Patient denies headache, fevers, malaise, unintentional weight loss, skin rash, eye pain, sinus congestion and sinus pain, sore throat, dysphagia,  hemoptysis , cough, dyspnea, wheezing, chest pain, palpitations, orthopnea, edema, abdominal pain, nausea, melena, diarrhea, constipation, flank pain, dysuria, hematuria, urinary  Frequency, nocturia, numbness, tingling, seizures,  Focal weakness, Loss of consciousness,  Tremor, insomnia, depression, anxiety, and suicidal ideation.      Objective:  BP 124/66 (BP Location: Left Arm, Patient Position: Sitting, Cuff Size: Large)   Pulse 76   Temp (!) 95.9 F (35.5 C) (Temporal)   Ht '5\' 5"'  (1.651 m)   Wt 197 lb 6.4 oz (89.5 kg)   SpO2 98%   BMI 32.85 kg/m   BP Readings from Last 3 Encounters:  04/26/21 124/66  01/11/21 (!) 142/70  07/08/19 (!) 144/61    Wt Readings from Last 3 Encounters:  04/26/21 197 lb 6.4 oz (89.5 kg)  03/14/21 204 lb (92.5 kg)  01/11/21 204 lb 12.8 oz (92.9 kg)  General appearance: alert, cooperative and appears stated age Ears: normal TM's and external ear canals both ears Throat: lips, mucosa, and tongue normal; teeth and gums normal Neck: no adenopathy, no carotid bruit, supple, symmetrical, trachea midline and thyroid not enlarged, symmetric, no tenderness/mass/nodules Back: symmetric, no curvature. ROM normal. No CVA tenderness. Lungs: clear to auscultation bilaterally Heart: regular rate and rhythm, S1, S2 normal, no murmur, click, rub or gallop Abdomen: soft, non-tender; bowel sounds normal; no masses,  no  organomegaly Pulses: 2+ and symmetric Skin: Skin color, texture, turgor normal. No rashes or lesions Lymph nodes: Cervical, supraclavicular, and axillary nodes normal.  Lab Results  Component Value Date   HGBA1C 6.6 (H) 04/26/2021   HGBA1C 8.1 (H) 12/21/2020   HGBA1C 7.9 (H) 04/06/2019    Lab Results  Component Value Date   CREATININE 1.26 (H) 04/26/2021   CREATININE 1.12 02/01/2021   CREATININE 1.29 (H) 01/11/2021    Lab Results  Component Value Date   WBC 9.4 02/19/2019   HGB 12.7 02/19/2019   HCT 38.6 02/19/2019   PLT 262.0 02/19/2019   GLUCOSE 121 (H) 04/26/2021   CHOL 152 04/26/2021   TRIG 133.0 04/26/2021   HDL 36.60 (L) 04/26/2021   LDLDIRECT 113.0 09/22/2015   LDLCALC 89 04/26/2021   ALT 19 04/26/2021   AST 21 04/26/2021   NA 137 04/26/2021   K 4.6 04/26/2021   CL 101 04/26/2021   CREATININE 1.26 (H) 04/26/2021   BUN 23 04/26/2021   CO2 26 04/26/2021   TSH 1.88 12/21/2020   HGBA1C 6.6 (H) 04/26/2021   MICROALBUR 34.3 (H) 12/21/2020    MM 3D SCREEN BREAST BILATERAL  Result Date: 02/26/2021 CLINICAL DATA:  Screening. EXAM: DIGITAL SCREENING BILATERAL MAMMOGRAM WITH TOMOSYNTHESIS AND CAD TECHNIQUE: Bilateral screening digital craniocaudal and mediolateral oblique mammograms were obtained. Bilateral screening digital breast tomosynthesis was performed. The images were evaluated with computer-aided detection. COMPARISON:  Previous exam(s). ACR Breast Density Category b: There are scattered areas of fibroglandular density. FINDINGS: There are no findings suspicious for malignancy. IMPRESSION: No mammographic evidence of malignancy. A result letter of this screening mammogram will be mailed directly to the patient. RECOMMENDATION: Screening mammogram in one year. (Code:[id number]-B-01Y) BI-RADS CATEGORY  1: Negative. Electronically Signed   By: Abelardo Diesel M.D.   On: 02/26/2021 09:21   Assessment & Plan:   Problem List Items Addressed This Visit     Essential  hypertension    Well controlled on current regimen of amlodipine 5 mg   telmisartan 80 mg and metoprolol succinate 25 mg. Renal function stable, no changes today.      Relevant Medications   telmisartan (MICARDIS) 80 MG tablet   atorvastatin (LIPITOR) 20 MG tablet   Hyperlipidemia    Changing zocor  To atorvastatin for  Goal LDL 70  Given presence of carotid placque on 2012 ultrasound.       Relevant Medications   telmisartan (MICARDIS) 80 MG tablet   atorvastatin (LIPITOR) 20 MG tablet   Other Relevant Orders   Lipid panel (Completed)   NAFLD (nonalcoholic fatty liver disease)    Repeat liver enzymes today,  Should improve with weight loss       Type 2 diabetes mellitus with stage 3 chronic kidney disease (HCC) - Primary    And diabetic retinopathy.  Improved control with ozempic start and glipzide stop . Dose increased to 1.0 mg weekly based on today's exam   .      Relevant Medications   telmisartan (  MICARDIS) 80 MG tablet   Semaglutide,0.25 or 0.5MG/DOS, (OZEMPIC, 0.25 OR 0.5 MG/DOSE,) 2 MG/1.5ML SOPN   atorvastatin (LIPITOR) 20 MG tablet   Other Relevant Orders   Comprehensive metabolic panel (Completed)   Hemoglobin A1c (Completed)   Other Visit Diagnoses     Type II diabetes mellitus with nephropathy (HCC)       Relevant Medications   telmisartan (MICARDIS) 80 MG tablet   Semaglutide,0.25 or 0.5MG/DOS, (OZEMPIC, 0.25 OR 0.5 MG/DOSE,) 2 MG/1.5ML SOPN   atorvastatin (LIPITOR) 20 MG tablet       I have discontinued Porscha W. Bobeck's simvastatin. I have also changed her telmisartan. Additionally, I am having her start on atorvastatin. Lastly, I am having her maintain her blood glucose meter kit and supplies, OneTouch Delica Plus [id number], Fish Oil, metoprolol succinate, Euthyrox, OneTouch Verio, amLODipine, and Ozempic (0.25 or 0.5 MG/DOSE).  Meds ordered this encounter  Medications   telmisartan (MICARDIS) 80 MG tablet    Sig: Take 1 tablet (80 mg total) by  mouth daily.    Dispense:  90 tablet    Refill:  3   Semaglutide,0.25 or 0.5MG/DOS, (OZEMPIC, 0.25 OR 0.5 MG/DOSE,) 2 MG/1.5ML SOPN    Sig: Inject 0.5 mg into the skin once a week. Inject 0.25 mg weekly for 4 weeks then increase to 0.5 mg weekly    Dispense:  1.5 mL    Refill:  0   atorvastatin (LIPITOR) 20 MG tablet    Sig: Take 1 tablet (20 mg total) by mouth daily.    Dispense:  90 tablet    Refill:  3    Medications Discontinued During This Encounter  Medication Reason   telmisartan (MICARDIS) 80 MG tablet    Semaglutide,0.25 or 0.5MG/DOS, (OZEMPIC, 0.25 OR 0.5 MG/DOSE,) 2 MG/1.5ML SOPN    simvastatin (ZOCOR) 40 MG tablet     Follow-up: No follow-ups on file.   Crecencio Mc, MD

## 2021-04-26 NOTE — Patient Instructions (Addendum)
You have lost 7 lbs so far!  Since your weight has plateaued,  I want you to Increase your dose of ozempic to 1 mg weekly using the 0.5 mg pens that you have  (if you tolerate it)

## 2021-04-26 NOTE — Chronic Care Management (AMB) (Signed)
Chronic Care Management CCM Pharmacy Note  04/26/2021 Name:  Marissa Rodriguez MRN:  [id number] DOB:  Sep 19, 1940  Subjective: Marissa Rodriguez is an 80 y.o. year old female who is a primary patient of Derrel Nip, Aris Everts, MD.  The CCM team was consulted for assistance with disease management and care coordination needs.    Care coordination for  medication access  for pharmacy case management and/or care coordination services.   Objective:  Medications Reviewed Today     Reviewed by Adair Laundry, [place] (Certified Medical Assistant) on 04/26/21 at [phone]  Med List Status: <None>   Medication Order Taking? Sig Documenting Provider Last Dose Status Informant  amLODipine (NORVASC) 5 MG tablet [id number] Yes Take 1 tablet by mouth once daily Crecencio Mc, MD Taking Active   blood glucose meter kit and supplies [id number] Yes Dispense based on patient and insurance preference. Use to check blood sugars twice daily. (FOR ICD-10 E11.69). Crecencio Mc, MD Taking Active   EUTHYROX 100 MCG tablet [id number] Yes TAKE 1 TABLET BY MOUTH ONCE DAILY BEFORE BREAKFAST Crecencio Mc, MD Taking Active   glucose blood [facility] VERIO) test strip [id number] Yes USE TO TEST BLOOD SUGAR UP TO TWICE DAILY Crecencio Mc, MD Taking Active   Lancets (ONETOUCH DELICA PLUS [id number]) [place] [id number] Yes USE TO TEST BLOOD SUGAR TWICE DAILY [provider] Taking Active   metoprolol succinate (TOPROL-XL) 25 MG 24 hr tablet [id number] Yes TAKE 1 TABLET BY MOUTH AT BEDTIME Crecencio Mc, MD Taking Active   Omega-3 Fatty Acids (FISH OIL) 1000 MG CAPS [id number] Yes Take 1 capsule by mouth daily. [provider] Taking Active   Semaglutide,0.25 or 0.5MG/DOS, (OZEMPIC, 0.25 OR 0.5 MG/DOSE,) 2 MG/1.5ML SOPN [id number] Yes Inject 0.5 mg into the skin once a week. Inject 0.25 mg weekly for 4 weeks then increase to 0.5 mg weekly Crecencio Mc, MD Taking Active   simvastatin (ZOCOR) 40 MG tablet [id number] Yes Take 1  tablet by mouth once daily Crecencio Mc, MD Taking Active   telmisartan (MICARDIS) 80 MG tablet [id number]  Take 1 tablet (80 mg total) by mouth daily. Crecencio Mc, MD  Active             Pertinent Labs:   Lab Results  Component Value Date   HGBA1C 8.1 (H) 12/21/2020   Lab Results  Component Value Date   CHOL 158 12/21/2020   HDL 38.00 (L) 12/21/2020   LDLCALC 83 12/21/2020   LDLDIRECT 113.0 09/22/2015   TRIG 183.0 (H) 12/21/2020   CHOLHDL 4 12/21/2020   Lab Results  Component Value Date   CREATININE 1.12 02/01/2021   BUN 26 (H) 02/01/2021   NA 138 02/01/2021   K 4.8 02/01/2021   CL 104 02/01/2021   CO2 23 02/01/2021     SDOH:  (Social Determinants of Health) assessments and interventions performed: Yes SDOH Interventions    Flowsheet Row Most Recent Value  SDOH Interventions   Financial Strain Interventions Other (Comment)  [manufacturer assistance]       CCM Care Plan  Review of patient past medical history, allergies, medications, health status, including review of consultants reports, laboratory and other test data, was performed as part of comprehensive evaluation and provision of chronic care management services.   Conditions to be addressed/monitored:  Hypertension, Hyperlipidemia, and Diabetes  Care Plan : Medication Management  Updates made by De Hollingshead, RPH-CPP since 04/26/2021 12:00 AM     Problem:  Diabetes, HTN, HLD      Long-Range Goal: Disease Progression Prevention   Start Date: 01/19/2021  Recent Progress: On track  Priority: High  Note:   Current Barriers:  Unable to independently afford treatment regimen Unable to achieve control of diabetes   Pharmacist Clinical Goal(s):  Over the next 90 days, patient will achieve control of diabetes as evidenced by A1c  through collaboration with PharmD and provider.   Interventions: 1:1 collaboration with Crecencio Mc, MD regarding development and update of comprehensive  plan of care as evidenced by provider attestation and co-signature Inter-disciplinary care team collaboration (see longitudinal plan of care) Comprehensive medication review performed; medication list updated in electronic medical record   Diabetes: Uncontrolled; current treatment: Ozempic 0.5 mg weekly APPROVED for Ozempic assistance through [employer] through 05/24/21 Hx metformin - stopped d/t diarrhea, kidney function Hx Januvia - does not remember ever taking PCP increasing Ozempic to 1 mg weekly. Refill script sent to [employer] today  Hypertension: Moderately well controlled; current treatment: metoprolol succinate 25 mg daily, amlodipine 5 mg daily, telmisartan 80 mg daily Previously recommended to continue current regimen at this time. Reviewed goal BP <130/80  Hyperlipidemia: Controlled at goal <100, though a more stringent goal <70 could be considered given DM + risk factors of HTN, CKD; current treatment: simvastatin 40 mg daily;  Medications previously tried: none Discussed DDI w/ simvastatin and amlodipine w/ PCP. Follow lipid results. Consider atorvastatin vs rosuvastatin. If eGFR drops to <30, rosuvastatin dose would need to be limited to 10 mg or changed to atorvastatin.   Hypothyroidism: Controlled per last lab work; current regimen: levothyroxine 100 mcg daily Previously recommended to continue current regimen at this time  Patient Goals/Self-Care Activities Over the next 90 days, patient will:  - take medications as prescribed check glucose twice daily, document, and provide at future appointments check blood pressure periodically, document, and provide at future appointments collaborate with provider on medication access solutions      Plan: Telephone follow up appointment with care management team member scheduled for:  4 weeks  [name] Darnelle Maffucci, PharmD, [place], [place] Pharmacist [employer] at [employer] [phone]

## 2021-04-26 NOTE — Patient Instructions (Signed)
Visit Information  The patient verbalized understanding of instructions, educational materials, and care plan provided today and declined offer to receive copy of patient instructions, educational materials, and care plan.   Plan: Telephone follow up appointment with care management team member scheduled for:  4 weeks  [name] Darnelle Maffucci, PharmD, [place], [place] Clinical Pharmacist [employer] at [employer] [phone]

## 2021-04-26 NOTE — Assessment & Plan Note (Signed)
And diabetic retinopathy.  Improved control with ozempic start and glipzide stop . Dose increased to 1.0 mg weekly based on today's exam   .

## 2021-04-26 NOTE — Assessment & Plan Note (Signed)
Repeat liver enzymes today,  Should improve with weight loss

## 2021-04-26 NOTE — Assessment & Plan Note (Signed)
Well controlled on current regimen of amlodipine 5 mg   telmisartan 80 mg and metoprolol succinate 25 mg. Renal function stable, no changes today.

## 2021-04-26 NOTE — Assessment & Plan Note (Addendum)
Changing zocor  To atorvastatin for  Goal LDL 70  Given presence of carotid placque on 2012 ultrasound.

## 2021-04-27 MED ORDER — ATORVASTATIN CALCIUM 20 MG PO TABS: 20.0000 mg | ORAL_TABLET | Freq: Every day | ORAL | 3 refills | Status: DC

## 2021-04-27 NOTE — Addendum Note (Signed)
Addended by: Crecencio Mc on: 04/27/2021 09:24 PM   Modules accepted: Orders

## 2021-05-04 ENCOUNTER — Other Ambulatory Visit: Payer: Self-pay | Admitting: Internal Medicine

## 2021-05-16 ENCOUNTER — Telehealth: Payer: Self-pay | Admitting: Pharmacy Technician

## 2021-05-16 ENCOUNTER — Ambulatory Visit (INDEPENDENT_AMBULATORY_CARE_PROVIDER_SITE_OTHER): Payer: Medicare HMO | Admitting: Pharmacist

## 2021-05-16 VITALS — Wt 190.0 lb

## 2021-05-16 DIAGNOSIS — I10 Essential (primary) hypertension: Secondary | ICD-10-CM

## 2021-05-16 DIAGNOSIS — N1832 Chronic kidney disease, stage 3b: Secondary | ICD-10-CM

## 2021-05-16 DIAGNOSIS — E1122 Type 2 diabetes mellitus with diabetic chronic kidney disease: Secondary | ICD-10-CM

## 2021-05-16 DIAGNOSIS — Z596 Low income: Secondary | ICD-10-CM

## 2021-05-16 DIAGNOSIS — Z59868 Other specified financial insecurity: Secondary | ICD-10-CM

## 2021-05-16 DIAGNOSIS — E782 Mixed hyperlipidemia: Secondary | ICD-10-CM

## 2021-05-16 MED ORDER — OZEMPIC (1 MG/DOSE) 4 MG/3ML ~~LOC~~ SOPN: 1.0000 mg | PEN_INJECTOR | SUBCUTANEOUS | 0 refills | Status: DC

## 2021-05-16 MED ORDER — DAPAGLIFLOZIN PROPANEDIOL 10 MG PO TABS: 10.0000 mg | ORAL_TABLET | Freq: Every day | ORAL | 0 refills | Status: DC

## 2021-05-16 NOTE — Chronic Care Management (AMB) (Signed)
Chronic Care Management CCM Pharmacy Note  05/16/2021 Name:  Marissa Rodriguez MRN:  [id number] DOB:  [birth year]  Summary: - A1c controlled. Tolerating Ozempic 1 mg well - CKD. No history SGLT2  Recommendations/Changes made from today's visit: - Start Farxiga 10 mg daily. Applying for patient assistance through [place]: Marissa Rodriguez is an 80 y.o. year old female who is a primary patient of [place], Aris Everts, MD.  The CCM team was consulted for assistance with disease management and care coordination needs.    Engaged with patient by telephone for follow up visit for pharmacy case management and/or care coordination services.   Objective:  Medications Reviewed Today     Reviewed by De Hollingshead, RPH-CPP (Pharmacist) on 05/16/21 at [phone]  Med List Status: <None>   Medication Order Taking? Sig Documenting Provider Last Dose Status Informant  amLODipine (NORVASC) 5 MG tablet [id number] Yes Take 1 tablet by mouth once daily Crecencio Mc, MD Taking Active   atorvastatin (LIPITOR) 20 MG tablet [id number] Yes Take 1 tablet (20 mg total) by mouth daily. Crecencio Mc, MD Taking Active   blood glucose meter kit and supplies [id number]  Dispense based on patient and insurance preference. Use to check blood sugars twice daily. (FOR ICD-10 E11.69). Crecencio Mc, MD  Active   EUTHYROX 100 MCG tablet [id number] Yes TAKE 1 TABLET BY MOUTH ONCE DAILY BEFORE BREAKFAST Crecencio Mc, MD Taking Active   glucose blood [facility] VERIO) test strip [id number]  USE TO TEST BLOOD SUGAR UP TO TWICE DAILY Crecencio Mc, MD  Active   Lancets (ONETOUCH DELICA PLUS [id number]) [place] [id number]  USE TO TEST BLOOD SUGAR TWICE DAILY [provider]  Active   metoprolol succinate (TOPROL-XL) 25 MG 24 hr tablet [id number] Yes TAKE 1 TABLET BY MOUTH AT BEDTIME Crecencio Mc, MD Taking Active   Omega-3 Fatty Acids (FISH OIL) 1000 MG CAPS [id number] Yes Take 1 capsule by mouth daily.  [provider] Taking Active   Semaglutide, 1 MG/DOSE, (OZEMPIC, 1 MG/DOSE,) 4 MG/3ML SOPN [id number] Yes Inject 1 mg into the skin once a week. Crecencio Mc, MD  Active   telmisartan (MICARDIS) 80 MG tablet [id number] Yes Take 1 tablet (80 mg total) by mouth daily. Crecencio Mc, MD Taking Active             Pertinent Labs:   Lab Results  Component Value Date   HGBA1C 6.6 (H) 04/26/2021   Lab Results  Component Value Date   CHOL 152 04/26/2021   HDL 36.60 (L) 04/26/2021   LDLCALC 89 04/26/2021   LDLDIRECT 113.0 09/22/2015   TRIG 133.0 04/26/2021   CHOLHDL 4 04/26/2021   Lab Results  Component Value Date   CREATININE 1.26 (H) 04/26/2021   BUN 23 04/26/2021   NA 137 04/26/2021   K 4.6 04/26/2021   CL 101 04/26/2021   CO2 26 04/26/2021    SDOH:  (Social Determinants of Health) assessments and interventions performed:  SDOH Interventions    Flowsheet Row Most Recent Value  SDOH Interventions   Financial Strain Interventions Other (Comment)  [manufacturer assistance]       CCM Care Plan  Review of patient past medical history, allergies, medications, health status, including review of consultants reports, laboratory and other test data, was performed as part of comprehensive evaluation and provision of chronic care management services.   Care Plan : Medication Management  Updates made by De Hollingshead,  RPH-CPP since 05/16/2021 12:00 AM     Problem: Diabetes, HTN, HLD      Long-Range Goal: Disease Progression Prevention   Start Date: 01/19/2021  Recent Progress: On track  Priority: High  Note:   Current Barriers:  Unable to independently afford treatment regimen Unable to achieve control of diabetes   Pharmacist Clinical Goal(s):  Over the next 90 days, patient will achieve control of diabetes as evidenced by A1c  through collaboration with PharmD and provider.   Interventions: 1:1 collaboration with Crecencio Mc, MD regarding  development and update of comprehensive plan of care as evidenced by provider attestation and co-signature Inter-disciplinary care team collaboration (see longitudinal plan of care) Comprehensive medication review performed; medication list updated in electronic medical record   Diabetes: Controlled; current treatment: Ozempic 1 mg weekly Denies GI upset since increasing Ozempic APPROVED for Ozempic assistance through [employer] through 05/24/21 Hx metformin - stopped d/t diarrhea, kidney function Hx Januvia - does not remember ever taking [place] for improvement in glycemic control. Discussed addition of SGLT2 for CKD benefit Counseled on SGLT2, including mechanism of action, side effects, and benefits. Discussed potential side effects of dehydration, genitourinary infections. Encouraged adequate hydration and genital hygiene. Advised on sick day rules (if a day with significantly reduced oral intake, serious vomiting, or diarrhea, hold SGLT2). Patient verbalized understanding. Start Farxiga 10 mg daily (CKD dosing).  Will collaborate w/ CPhT, patient, and providers to reapply for patient assistance for Franne Grip for 2023.   Hypertension: Moderately well controlled; current treatment: metoprolol succinate 25 mg daily, amlodipine 5 mg daily, telmisartan 80 mg daily Previously recommended to continue current regimen at this time. Reviewed goal BP <130/80  Hyperlipidemia: Uncontrolled; current treatment: atorvastatin 20 mg daily Medications previously tried: none Denies side effects since starting atorvastatin. Will check lipids in ~ 3 months.   Hypothyroidism: Controlled per last lab work; current regimen: levothyroxine 100 mcg daily Previously recommended to continue current regimen at this time  Patient Goals/Self-Care Activities Over the next 90 days, patient will:  - take medications as prescribed check glucose twice daily, document, and provide at future  appointments check blood pressure periodically, document, and provide at future appointments collaborate with provider on medication access solutions      Plan: Telephone follow up appointment with care management team member scheduled for:  8 weeks  [name] Darnelle Maffucci, PharmD, [place], [place] Pharmacist [employer] at [employer] [phone]

## 2021-05-16 NOTE — Progress Notes (Signed)
[facility])                                            [place] Team    05/16/2021  Marissa Rodriguez Oct 09, 1940 [id number]  FOR 2023 RE ENROLLMENT                                      Medication Assistance Referral  Referral From: [facility] Embedded RPh [name] T.   Medication/Company: [name] Patient application portion:  Education officer, museum portion: Interoffice Mailed to Dr. Derrel Nip Provider address/fax verified via: Office website     [employer]. [place], [place]  [phone]

## 2021-05-16 NOTE — Patient Instructions (Signed)
Marissa Rodriguez,   Keep up the great work!  Start Farxiga 10 mg daily. Take this medication in the morning, as it can cause more frequent urination with more sugar in the urine. It may worsen risk for dehydration or genital infections. Focus on staying well hydrated and using good genital hygiene. Stop the medication and call our office if you develop any symptoms of genital infections, such as burning, itching, or pain while urinating or itching with redness that could be a yeast infection. If you have a day that you are vomiting or having diarrhea and you are very dehydrated, please hold this medication until you feel better.   Take this card to the pharmacy to get the first 30 day supply for free while we work on assistance from the manufacturer, [employer].   Call Novo Nord[facility] to follow up on the status of the shipment of your Ozempic 1 mg order. [phone]  Take care!  [name] Darnelle Maffucci, PharmD  Visit Information  Following are the goals we discussed today:  Patient Goals/Self-Care Activities Over the next 90 days, patient will:  - take medications as prescribed check glucose twice daily, document, and provide at future appointments check blood pressure periodically, document, and provide at future appointments collaborate with provider on medication access solutions        Plan: Telephone follow up appointment with care management team member scheduled for:  8 weeks   [name] Darnelle Maffucci, PharmD, [place], CPP Clinical Pharmacist [place] at [facility] [phone]       Please call the care guide team at [phone] if you need to cancel or reschedule your appointment.   Patient verbalizes understanding of instructions provided today and agrees to view in [place].

## 2021-05-17 ENCOUNTER — Ambulatory Visit: Payer: Medicare HMO | Admitting: Pharmacist

## 2021-05-17 DIAGNOSIS — E782 Mixed hyperlipidemia: Secondary | ICD-10-CM

## 2021-05-17 DIAGNOSIS — N1832 Chronic kidney disease, stage 3b: Secondary | ICD-10-CM

## 2021-05-17 DIAGNOSIS — E1122 Type 2 diabetes mellitus with diabetic chronic kidney disease: Secondary | ICD-10-CM

## 2021-05-17 NOTE — Patient Instructions (Signed)
Visit Information  Following are the goals we discussed today:    Patient Goals/Self-Care Activities Over the next 90 days, patient will:  - take medications as prescribed check glucose twice daily, document, and provide at future appointments check blood pressure periodically, document, and provide at future appointments collaborate with provider on medication access solutions        Plan: Telephone follow up appointment with care management team member scheduled for:  8 weeks as previously scheduled   [name] Darnelle Maffucci, PharmD, [place], CPP Clinical Pharmacist [place] at [facility] [phone]  Please call the care guide team at [phone] if you need to cancel or reschedule your appointment.   Patient verbalizes understanding of instructions provided today and agrees to view in [place].

## 2021-05-17 NOTE — Chronic Care Management (AMB) (Signed)
Chronic Care Management CCM Pharmacy Note  11/23/20222 Name:  Marissa Rodriguez MRN:  [id number] DOB:  Nov 18, 1940  Summary: - Contacted [facility] to follow up on shipping status of Ozempic 1 mg  Recommendations/Changes made from today's visit: - Order should arrive to our office by next week. Will follow up if not received  Subjective: Marissa Rodriguez is an 80 y.o. year old female who is a primary patient of Tullo, Aris Everts, MD.  The CCM team was consulted for assistance with disease management and care coordination needs.    Care coordination  for  medication access  for pharmacy case management and/or care coordination services.   Objective:  Medications Reviewed Today     Reviewed by De Hollingshead, RPH-CPP (Pharmacist) on 05/16/21 at [phone]  Med List Status: <None>   Medication Order Taking? Sig Documenting Provider Last Dose Status Informant  amLODipine (NORVASC) 5 MG tablet [id number] Yes Take 1 tablet by mouth once daily Crecencio Mc, MD Taking Active   atorvastatin (LIPITOR) 20 MG tablet [id number] Yes Take 1 tablet (20 mg total) by mouth daily. Crecencio Mc, MD Taking Active   blood glucose meter kit and supplies [id number]  Dispense based on patient and insurance preference. Use to check blood sugars twice daily. (FOR ICD-10 E11.69). Crecencio Mc, MD  Active   EUTHYROX 100 MCG tablet [id number] Yes TAKE 1 TABLET BY MOUTH ONCE DAILY BEFORE BREAKFAST Crecencio Mc, MD Taking Active   glucose blood [facility] VERIO) test strip [id number]  USE TO TEST BLOOD SUGAR UP TO TWICE DAILY Crecencio Mc, MD  Active   Lancets (ONETOUCH DELICA PLUS [id number]) [place] [id number]  USE TO TEST BLOOD SUGAR TWICE DAILY [provider]  Active   metoprolol succinate (TOPROL-XL) 25 MG 24 hr tablet [id number] Yes TAKE 1 TABLET BY MOUTH AT BEDTIME Crecencio Mc, MD Taking Active   Omega-3 Fatty Acids (FISH OIL) 1000 MG CAPS [id number] Yes Take 1 capsule by mouth daily. [provider] Taking Active   Semaglutide, 1 MG/DOSE, (OZEMPIC, 1 MG/DOSE,) 4 MG/3ML SOPN [id number] Yes Inject 1 mg into the skin once a week. Crecencio Mc, MD  Active   telmisartan (MICARDIS) 80 MG tablet [id number] Yes Take 1 tablet (80 mg total) by mouth daily. Crecencio Mc, MD Taking Active             Pertinent Labs:   Lab Results  Component Value Date   HGBA1C 6.6 (H) 04/26/2021   Lab Results  Component Value Date   CHOL 152 04/26/2021   HDL 36.60 (L) 04/26/2021   LDLCALC 89 04/26/2021   LDLDIRECT 113.0 09/22/2015   TRIG 133.0 04/26/2021   CHOLHDL 4 04/26/2021   Lab Results  Component Value Date   CREATININE 1.26 (H) 04/26/2021   BUN 23 04/26/2021   NA 137 04/26/2021   K 4.6 04/26/2021   CL 101 04/26/2021   CO2 26 04/26/2021    SDOH:  (Social Determinants of Health) assessments and interventions performed:  SDOH Interventions    Flowsheet Row Most Recent Value  SDOH Interventions   Financial Strain Interventions Other (Comment)  [manufacturer assistance]       CCM Care Plan  Review of patient past medical history, allergies, medications, health status, including review of consultants reports, laboratory and other test data, was performed as part of comprehensive evaluation and provision of chronic care management services.   Care Plan : Medication Management  Updates made by  De Hollingshead, RPH-CPP since 05/17/2021 12:00 AM     Problem: Diabetes, HTN, HLD      Long-Range Goal: Disease Progression Prevention   Start Date: 01/19/2021  Recent Progress: On track  Priority: High  Note:   Current Barriers:  Unable to independently afford treatment regimen Unable to achieve control of diabetes   Pharmacist Clinical Goal(s):  Over the next 90 days, patient will achieve control of diabetes as evidenced by A1c  through collaboration with PharmD and provider.   Interventions: 1:1 collaboration with Crecencio Mc, MD regarding development and  update of comprehensive plan of care as evidenced by provider attestation and co-signature Inter-disciplinary care team collaboration (see longitudinal plan of care) Comprehensive medication review performed; medication list updated in electronic medical record   Diabetes: Controlled; current treatment: Ozempic 1 mg weekly, Farxiga 10 mg daily Denies GI upset since increasing Ozempic APPROVED for Ozempic assistance through [employer] through 05/24/21 Hx metformin - stopped d/t diarrhea, kidney function Hx Januvia - does not remember ever taking [employer] to follow up on status of Ozempic 1 mg refill order. Order processed 04/30/21, it can take up to 14 business days for shipping. Will follow up next week if we have not received the medication.   Hypertension: Moderately well controlled; current treatment: metoprolol succinate 25 mg daily, amlodipine 5 mg daily, telmisartan 80 mg daily Previously recommended to continue current regimen at this time. Reviewed goal BP <130/80  Hyperlipidemia: Uncontrolled; current treatment: atorvastatin 20 mg daily Medications previously tried: none Denies side effects since starting atorvastatin. Will check lipids in ~ 3 months.   Hypothyroidism: Controlled per last lab work; current regimen: levothyroxine 100 mcg daily Previously recommended to continue current regimen at this time  Patient Goals/Self-Care Activities Over the next 90 days, patient will:  - take medications as prescribed check glucose twice daily, document, and provide at future appointments check blood pressure periodically, document, and provide at future appointments collaborate with provider on medication access solutions      Plan: Telephone follow up appointment with care management team member scheduled for:  8 weeks as previously scheduled  [name] Darnelle Maffucci, PharmD, [place], [place] Pharmacist [employer] at [employer] [phone]

## 2021-05-23 ENCOUNTER — Ambulatory Visit: Payer: Medicare HMO | Admitting: Pharmacist

## 2021-05-23 ENCOUNTER — Telehealth: Payer: Self-pay | Admitting: Pharmacy Technician

## 2021-05-23 DIAGNOSIS — N1832 Chronic kidney disease, stage 3b: Secondary | ICD-10-CM

## 2021-05-23 DIAGNOSIS — Z596 Low income: Secondary | ICD-10-CM

## 2021-05-23 DIAGNOSIS — E1122 Type 2 diabetes mellitus with diabetic chronic kidney disease: Secondary | ICD-10-CM

## 2021-05-23 DIAGNOSIS — Z59868 Other specified financial insecurity: Secondary | ICD-10-CM

## 2021-05-23 NOTE — Chronic Care Management (AMB) (Signed)
Chronic Care Management CCM Pharmacy Note  05/23/2021 Name:  Marissa Rodriguez MRN:  [id number] DOB:  1940-10-29  Summary: - Received patient and provider portions of Farxiga application  Recommendations/Changes made from today's visit: - Submitted to [place] today.  Subjective: Marissa Rodriguez is an 80 y.o. year old female who is a primary patient of Tullo, Aris Everts, MD.  The CCM team was consulted for assistance with disease management and care coordination needs.    Care coordination  for  medication access  for pharmacy case management and/or care coordination services.   Objective:  Medications Reviewed Today     Reviewed by De Hollingshead, RPH-CPP (Pharmacist) on 05/16/21 at [phone]  Med List Status: <None>   Medication Order Taking? Sig Documenting Provider Last Dose Status Informant  amLODipine (NORVASC) 5 MG tablet [id number] Yes Take 1 tablet by mouth once daily Crecencio Mc, MD Taking Active   atorvastatin (LIPITOR) 20 MG tablet [id number] Yes Take 1 tablet (20 mg total) by mouth daily. Crecencio Mc, MD Taking Active   blood glucose meter kit and supplies [id number]  Dispense based on patient and insurance preference. Use to check blood sugars twice daily. (FOR ICD-10 E11.69). Crecencio Mc, MD  Active   EUTHYROX 100 MCG tablet [id number] Yes TAKE 1 TABLET BY MOUTH ONCE DAILY BEFORE BREAKFAST Crecencio Mc, MD Taking Active   glucose blood [facility] VERIO) test strip [id number]  USE TO TEST BLOOD SUGAR UP TO TWICE DAILY Crecencio Mc, MD  Active   Lancets (ONETOUCH DELICA PLUS [id number]) [place] [id number]  USE TO TEST BLOOD SUGAR TWICE DAILY [provider]  Active   metoprolol succinate (TOPROL-XL) 25 MG 24 hr tablet [id number] Yes TAKE 1 TABLET BY MOUTH AT BEDTIME Crecencio Mc, MD Taking Active   Omega-3 Fatty Acids (FISH OIL) 1000 MG CAPS [id number] Yes Take 1 capsule by mouth daily. [provider] Taking Active   Semaglutide, 1 MG/DOSE, (OZEMPIC, 1  MG/DOSE,) 4 MG/3ML SOPN [id number] Yes Inject 1 mg into the skin once a week. Crecencio Mc, MD  Active   telmisartan (MICARDIS) 80 MG tablet [id number] Yes Take 1 tablet (80 mg total) by mouth daily. Crecencio Mc, MD Taking Active             Pertinent Labs:   Lab Results  Component Value Date   HGBA1C 6.6 (H) 04/26/2021   Lab Results  Component Value Date   CHOL 152 04/26/2021   HDL 36.60 (L) 04/26/2021   LDLCALC 89 04/26/2021   LDLDIRECT 113.0 09/22/2015   TRIG 133.0 04/26/2021   CHOLHDL 4 04/26/2021   Lab Results  Component Value Date   CREATININE 1.26 (H) 04/26/2021   BUN 23 04/26/2021   NA 137 04/26/2021   K 4.6 04/26/2021   CL 101 04/26/2021   CO2 26 04/26/2021    SDOH:  (Social Determinants of Health) assessments and interventions performed:  SDOH Interventions    Flowsheet Row Most Recent Value  SDOH Interventions   Financial Strain Interventions Other (Comment)  [manufacturer assistance]       CCM Care Plan  Review of patient past medical history, allergies, medications, health status, including review of consultants reports, laboratory and other test data, was performed as part of comprehensive evaluation and provision of chronic care management services.   Care Plan : Medication Management  Updates made by De Hollingshead, RPH-CPP since 05/23/2021 12:00 AM     Problem: Diabetes, HTN, HLD  Long-Range Goal: Disease Progression Prevention   Start Date: 01/19/2021  Recent Progress: On track  Priority: High  Note:   Current Barriers:  Unable to independently afford treatment regimen Unable to achieve control of diabetes   Pharmacist Clinical Goal(s):  Over the next 90 days, patient will achieve control of diabetes as evidenced by A1c  through collaboration with PharmD and provider.   Interventions: 1:1 collaboration with Crecencio Mc, MD regarding development and update of comprehensive plan of care as evidenced by provider  attestation and co-signature Inter-disciplinary care team collaboration (see longitudinal plan of care) Comprehensive medication review performed; medication list updated in electronic medical record   Diabetes: Controlled; current treatment: Ozempic 1 mg weekly, Farxiga 10 mg daily Denies GI upset since increasing Ozempic APPROVED for Ozempic assistance through [employer] through 05/24/21 Hx metformin - stopped d/t diarrhea, kidney function Hx Januvia - does not remember ever taking [employer] to follow up on status of Ozempic 1 mg refill order. Order processed 04/30/21, it can take up to 14 business days for shipping. Will follow up next week if we have not received the medication. Received patient and provider portions of Farxiga application. Submitted to [place]. Will collaborate w/ CPhT to follow up  Hypertension: Moderately well controlled; current treatment: metoprolol succinate 25 mg daily, amlodipine 5 mg daily, telmisartan 80 mg daily Previously recommended to continue current regimen at this time. Reviewed goal BP <130/80  Hyperlipidemia: Uncontrolled; current treatment: atorvastatin 20 mg daily Medications previously tried: none Denies side effects since starting atorvastatin. Will check lipids in ~ 3 months.   Hypothyroidism: Controlled per last lab work; current regimen: levothyroxine 100 mcg daily Previously recommended to continue current regimen at this time  Patient Goals/Self-Care Activities Over the next 90 days, patient will:  - take medications as prescribed check glucose twice daily, document, and provide at future appointments check blood pressure periodically, document, and provide at future appointments collaborate with provider on medication access solutions      Plan: Telephone follow up appointment with care management team member scheduled for:  8 weeks as previously scheduled  [name] Darnelle Maffucci, PharmD, [place], [place] Pharmacist [place] at [employer] [phone]

## 2021-05-23 NOTE — Progress Notes (Signed)
[facility])                                            [place] Team    05/23/2021  Marissa Rodriguez 09-24-1940 [id number]                                      Medication Assistance Referral  Referral From: [facility] Embedded RPh [name] T.   Medication/Company: [name] Patient application portion: Interoffice Mailed Provider application portion: Interoffice Mailed to Dr. Derrel Nip Provider address/fax verified via: Office website   Received both patient and provider portion(s) of patient assistance application(s) for [place]. Faxed completed application and required documents into [place].    Marissa Rodriguez, [place]  [phone]

## 2021-05-23 NOTE — Patient Instructions (Signed)
Visit Information  Following are the goals we discussed today:  Patient Goals/Self-Care Activities Over the next 90 days, patient will:  - take medications as prescribed check glucose twice daily, document, and provide at future appointments check blood pressure periodically, document, and provide at future appointments collaborate with provider on medication access solutions        Plan: Telephone follow up appointment with care management team member scheduled for:  8 weeks as previously scheduled   [name] Darnelle Maffucci, PharmD, [place], CPP Clinical Pharmacist [place] at [facility] [phone]   Please call the care guide team at [phone] if you need to cancel or reschedule your appointment.   Patient verbalizes understanding of instructions provided today and agrees to view in [place].

## 2021-05-24 DIAGNOSIS — E1122 Type 2 diabetes mellitus with diabetic chronic kidney disease: Secondary | ICD-10-CM | POA: Diagnosis not present

## 2021-05-24 DIAGNOSIS — I129 Hypertensive chronic kidney disease with stage 1 through stage 4 chronic kidney disease, or unspecified chronic kidney disease: Secondary | ICD-10-CM

## 2021-05-24 DIAGNOSIS — E782 Mixed hyperlipidemia: Secondary | ICD-10-CM | POA: Diagnosis not present

## 2021-05-24 DIAGNOSIS — Z7985 Long-term (current) use of injectable non-insulin antidiabetic drugs: Secondary | ICD-10-CM

## 2021-05-24 DIAGNOSIS — N1832 Chronic kidney disease, stage 3b: Secondary | ICD-10-CM | POA: Diagnosis not present

## 2021-06-01 ENCOUNTER — Telehealth: Payer: Self-pay | Admitting: Pharmacy Technician

## 2021-06-01 DIAGNOSIS — Z59868 Other specified financial insecurity: Secondary | ICD-10-CM

## 2021-06-01 DIAGNOSIS — Z596 Low income: Secondary | ICD-10-CM

## 2021-06-01 NOTE — Progress Notes (Signed)
[place] [facility])                                            [place] Team    06/01/2021  Marissa Rodriguez 1941-01-19 [id number]  Care coordination call placed to [employer] in regard to [facility] application.  Spoke to [place] who informed patient was APPROVED 05/26/21-06/24/22. She informs the prescription is processing at the pharmacy and should have the medication delivered to her home in about a month.  [name], [place]  [phone]

## 2021-06-06 ENCOUNTER — Telehealth: Payer: Self-pay | Admitting: Pharmacy Technician

## 2021-06-06 ENCOUNTER — Telehealth: Payer: Self-pay

## 2021-06-06 DIAGNOSIS — Z596 Low income: Secondary | ICD-10-CM

## 2021-06-06 DIAGNOSIS — Z59868 Other specified financial insecurity: Secondary | ICD-10-CM

## 2021-06-06 NOTE — Progress Notes (Signed)
[place] [facility])                                            [address] Team    06/06/2021  Marissa Rodriguez 21-Feb-1941 [id number]  Received both patient and provider portion(s) of patient assistance application(s) for Ozempic. Faxed completed application and required documents into [employer].   [name] [name], [place]  [phone]

## 2021-06-06 NOTE — Telephone Encounter (Signed)
Received pt's patient assistance medication. Pt is aware and stated that she would be by to pick up tomorrow.   Ozempic: 2 boxes

## 2021-06-07 NOTE — Telephone Encounter (Signed)
Patient has picked up medication Ozempic 2 boxes 1 mg .

## 2021-06-08 ENCOUNTER — Other Ambulatory Visit: Payer: Self-pay | Admitting: Internal Medicine

## 2021-06-08 DIAGNOSIS — E034 Atrophy of thyroid (acquired): Secondary | ICD-10-CM

## 2021-06-13 ENCOUNTER — Other Ambulatory Visit: Payer: Self-pay | Admitting: Internal Medicine

## 2021-06-13 DIAGNOSIS — E1122 Type 2 diabetes mellitus with diabetic chronic kidney disease: Secondary | ICD-10-CM

## 2021-06-13 DIAGNOSIS — N1832 Chronic kidney disease, stage 3b: Secondary | ICD-10-CM

## 2021-06-14 ENCOUNTER — Telehealth: Payer: Self-pay | Admitting: Pharmacist

## 2021-06-14 NOTE — Telephone Encounter (Signed)
Patient presented to the office. Has completed free 30 day supply of [place]. Was approved for [place] assistance, medication shipped from [employer] on 06/07/21.   Providing 2 week supply to tide patient over until she receives PAP order.   Medication Samples have been provided to the patient.  Drug name: Wilder Glade       Strength: 10 mg        Qty: 2 boxes  LOT: [id number]  Exp.Date: 01/23/2024  Dosing instructions: Take 1 tablet by mouth daily  The patient has been instructed regarding the correct time, dose, and frequency of taking this medication, including desired effects and most common side effects.   De Hollingshead 2:45 PM 06/14/2021

## 2021-06-27 DIAGNOSIS — I10 Essential (primary) hypertension: Secondary | ICD-10-CM | POA: Diagnosis not present

## 2021-06-27 DIAGNOSIS — R809 Proteinuria, unspecified: Secondary | ICD-10-CM | POA: Diagnosis not present

## 2021-06-27 DIAGNOSIS — E1122 Type 2 diabetes mellitus with diabetic chronic kidney disease: Secondary | ICD-10-CM | POA: Diagnosis not present

## 2021-06-27 DIAGNOSIS — N1831 Chronic kidney disease, stage 3a: Secondary | ICD-10-CM | POA: Diagnosis not present

## 2021-07-13 ENCOUNTER — Telehealth: Payer: Self-pay | Admitting: Pharmacy Technician

## 2021-07-13 ENCOUNTER — Ambulatory Visit (INDEPENDENT_AMBULATORY_CARE_PROVIDER_SITE_OTHER): Payer: Medicare HMO | Admitting: Pharmacist

## 2021-07-13 DIAGNOSIS — N1832 Chronic kidney disease, stage 3b: Secondary | ICD-10-CM

## 2021-07-13 DIAGNOSIS — Z596 Low income: Secondary | ICD-10-CM

## 2021-07-13 DIAGNOSIS — E1121 Type 2 diabetes mellitus with diabetic nephropathy: Secondary | ICD-10-CM

## 2021-07-13 DIAGNOSIS — E782 Mixed hyperlipidemia: Secondary | ICD-10-CM

## 2021-07-13 DIAGNOSIS — E1122 Type 2 diabetes mellitus with diabetic chronic kidney disease: Secondary | ICD-10-CM

## 2021-07-13 DIAGNOSIS — Z59868 Other specified financial insecurity: Secondary | ICD-10-CM

## 2021-07-13 NOTE — Patient Instructions (Signed)
Marissa Rodriguez,   It was great talking to you today!  Let me know if you run low on your Ozempic before the order arrives from Patient Assistance. You can call Novo Nordisk to follow up on your processing and shipment at [phone].   We recommend the Shingrix (shingles) vaccine series for all over age 80. It should have a $0 copay on all Medicare plans this year. You can pursue this without a prescription at your local pharmacy, or feel free to call our [place] at [facility] at [phone].  Take care!  [name] Darnelle Maffucci, PharmD  Visit Information  Following are the goals we discussed today:  Patient Goals/Self-Care Activities Over the next 90 days, patient will:  - take medications as prescribed check glucose twice daily, document, and provide at future appointments check blood pressure periodically, document, and provide at future appointments collaborate with provider on medication access solutions        Plan: Telephone follow up appointment with care management team member scheduled for:  6 months   [name] Darnelle Maffucci, PharmD, [place], CPP Clinical Pharmacist [place] at [facility] [phone]   Please call the care guide team at [phone] if you need to cancel or reschedule your appointment.   The patient verbalized understanding of instructions, educational materials, and care plan provided today and agreed to receive a mailed copy of patient instructions, educational materials, and care plan.

## 2021-07-13 NOTE — Chronic Care Management (AMB) (Signed)
Chronic Care Management CCM Pharmacy Note  07/13/2021 Name:  Marissa Rodriguez MRN:  [id number] DOB:  [birth year]  Summary: - Tolerating current regimen at this time  Recommendations/Changes made from today's visit: - Recommended to continue current regimen - Discussed Shingrix vaccination  Subjective: Marissa Rodriguez is an 81 y.o. year old female who is a primary patient of Derrel Nip, Aris Everts, MD.  The CCM team was consulted for assistance with disease management and care coordination needs.    Engaged with patient by telephone for follow up visit for pharmacy case management and/or care coordination services.   Objective:  Medications Reviewed Today     Reviewed by De Hollingshead, RPH-CPP (Pharmacist) on 07/13/21 at 0904  Med List Status: <None>   Medication Order Taking? Sig Documenting Provider Last Dose Status Informant  amLODipine (NORVASC) 5 MG tablet [id number] Yes Take 1 tablet by mouth once daily Crecencio Mc, MD Taking Active   atorvastatin (LIPITOR) 20 MG tablet [id number] Yes Take 1 tablet (20 mg total) by mouth daily. Crecencio Mc, MD Taking Active   blood glucose meter kit and supplies [id number]  Dispense based on patient and insurance preference. Use to check blood sugars twice daily. (FOR ICD-10 E11.69). Crecencio Mc, MD  Active   FARXIGA 10 MG TABS tablet [id number] Yes TAKE 1 TABLET BY MOUTH ONCE DAILY BEFORE BREAKFAST Crecencio Mc, MD Taking Active   glucose blood [facility] VERIO) test strip [id number] Yes USE TO TEST BLOOD SUGAR UP TO TWICE DAILY Crecencio Mc, MD Taking Active   Lancets (ONETOUCH DELICA PLUS ZESPQZ30Q) [place] [id number] Yes USE TO TEST BLOOD SUGAR TWICE DAILY [provider] Taking Active   levothyroxine (SYNTHROID) 100 MCG tablet [id number] Yes TAKE 1 TABLET BY MOUTH ONCE DAILY BEFORE BREAKFAST Crecencio Mc, MD Taking Active   metoprolol succinate (TOPROL-XL) 25 MG 24 hr tablet [id number] Yes TAKE 1 TABLET BY MOUTH AT BEDTIME  Crecencio Mc, MD Taking Active   Omega-3 Fatty Acids (FISH OIL) 1000 MG CAPS [id number] Yes Take 1 capsule by mouth daily. [provider] Taking Active   Semaglutide, 1 MG/DOSE, (OZEMPIC, 1 MG/DOSE,) 4 MG/3ML SOPN [id number] Yes Inject 1 mg into the skin once a week. Crecencio Mc, MD Taking Active   telmisartan (MICARDIS) 80 MG tablet [id number] Yes Take 1 tablet (80 mg total) by mouth daily. Crecencio Mc, MD Taking Active             Pertinent Labs:   Lab Results  Component Value Date   HGBA1C 6.6 (H) 04/26/2021   Lab Results  Component Value Date   CHOL 152 04/26/2021   HDL 36.60 (L) 04/26/2021   LDLCALC 89 04/26/2021   LDLDIRECT 113.0 09/22/2015   TRIG 133.0 04/26/2021   CHOLHDL 4 04/26/2021   Lab Results  Component Value Date   CREATININE 1.26 (H) 04/26/2021   BUN 23 04/26/2021   NA 137 04/26/2021   K 4.6 04/26/2021   CL 101 04/26/2021   CO2 26 04/26/2021    SDOH:  (Social Determinants of Health) assessments and interventions performed:  SDOH Interventions    Flowsheet Row Most Recent Value  SDOH Interventions   Financial Strain Interventions Other (Comment)  [manufacturer assistance]       CCM Care Plan  Review of patient past medical history, allergies, medications, health status, including review of consultants reports, laboratory and other test data, was performed as part of comprehensive evaluation and provision of chronic care  management services.   Care Plan : Medication Management  Updates made by De Hollingshead, RPH-CPP since 07/13/2021 12:00 AM     Problem: Diabetes, HTN, HLD      Long-Range Goal: Disease Progression Prevention   Start Date: 01/19/2021  Recent Progress: On track  Priority: High  Note:   Current Barriers:  Unable to independently afford treatment regimen Unable to achieve control of diabetes   Pharmacist Clinical Goal(s):  Over the next 90 days, patient will achieve control of diabetes as evidenced by  A1c  through collaboration with PharmD and provider.   Interventions: 1:1 collaboration with Crecencio Mc, MD regarding development and update of comprehensive plan of care as evidenced by provider attestation and co-signature Inter-disciplinary care team collaboration (see longitudinal plan of care) Comprehensive medication review performed; medication list updated in electronic medical record  Health Maintenance   Yearly diabetic eye exam: up to date Yearly diabetic foot exam: up to date Urine microalbumin: up to date Yearly influenza vaccination: up to date Td/Tdap vaccination: up to date Pneumonia vaccination: up to date COVID vaccinations: due - declines Shingrix vaccinations: due - recommended to pursue vaccination at the pharmacy Colonoscopy: up to date Bone density scan: up to date Mammogram: up to date  Diabetes: Controlled; current treatment: Ozempic 1 mg weekly, Farxiga 10 mg daily Denies any s/sx GU infections APPROVED for [place] assistance through [place] through 06/24/22; awaiting Ozempic processing Hx metformin - stopped d/t diarrhea, kidney function Hx Januvia - does not remember ever taking Current glucose readings: fasting: 120-130s; 2 hour post prandial: 110-120s Recommended to continue current regimen at this time  Hypertension: Moderately well controlled; current treatment: metoprolol succinate 25 mg daily, amlodipine 5 mg daily, telmisartan 80 mg daily Home BP readings: ~120-130s/60-70s; denies lightheadedness, dizziness Recommended to continue current regimen at this time.   Hyperlipidemia: Uncontrolled; current treatment: atorvastatin 20 mg daily; denies side effects since starting atorvastatin Medications previously tried: simvastatin Recommended to continue current regimen at this time. Follow up with lipid panel.   Hypothyroidism: Controlled per last lab work; current regimen: levothyroxine 100 mcg daily Previously recommended to continue current  regimen at this time  Patient Goals/Self-Care Activities Over the next 90 days, patient will:  - take medications as prescribed check glucose twice daily, document, and provide at future appointments check blood pressure periodically, document, and provide at future appointments collaborate with provider on medication access solutions      Plan: Telephone follow up appointment with care management team member scheduled for:  6 months  [name] Darnelle Maffucci, PharmD, [place], [place] Pharmacist [employer] at [employer] [phone]

## 2021-07-13 NOTE — Progress Notes (Signed)
[facility] [facility])                                            [place] Team    07/13/2021  Marnette W [place] 01/27/1941 [id number]  Care coordination call placed to [place] in regard to [place] application.  Spoke to [place] who informs patient is APPROVED 06/25/21-06/24/22. She informs medication will auto fill and ship to provider's office based on last fill date in 2022 and going forward.  Collyn Selk P. Radiah Lubinski, [place]  [phone]

## 2021-07-14 ENCOUNTER — Other Ambulatory Visit: Payer: Self-pay | Admitting: Internal Medicine

## 2021-07-25 DIAGNOSIS — E039 Hypothyroidism, unspecified: Secondary | ICD-10-CM

## 2021-07-25 DIAGNOSIS — E782 Mixed hyperlipidemia: Secondary | ICD-10-CM

## 2021-07-25 DIAGNOSIS — E1169 Type 2 diabetes mellitus with other specified complication: Secondary | ICD-10-CM | POA: Diagnosis not present

## 2021-07-25 DIAGNOSIS — Z7984 Long term (current) use of oral hypoglycemic drugs: Secondary | ICD-10-CM

## 2021-07-25 DIAGNOSIS — I10 Essential (primary) hypertension: Secondary | ICD-10-CM | POA: Diagnosis not present

## 2021-07-28 ENCOUNTER — Other Ambulatory Visit: Payer: Self-pay | Admitting: Internal Medicine

## 2021-08-07 ENCOUNTER — Telehealth: Payer: Self-pay | Admitting: Pharmacist

## 2021-08-07 NOTE — Telephone Encounter (Signed)
Patient picked up her Ozempic sample from [name] at 3:34p.

## 2021-08-07 NOTE — Telephone Encounter (Signed)
Medication Samples have been labeled and logged for the patient.  Drug name: Ozempic       Strength: 8 mg/3 mL        Qty: 1 pen  LOT: [id number]  Exp.Date: 12/22/21  Dosing instructions: Inject 1 mg (37 clicks) once weekly  The patient has been instructed regarding the correct time, dose, and frequency of taking this medication, including desired effects and most common side effects.   De Hollingshead 9:46 AM 08/07/2021

## 2021-08-31 ENCOUNTER — Ambulatory Visit: Payer: Self-pay | Admitting: Pharmacist

## 2021-08-31 NOTE — Chronic Care Management (AMB) (Signed)
?  Chronic Care Management  ? ?Note ? ?08/31/2021 ?Name: Marissa Rodriguez MRN: [id number] DOB: September 21, 1940 ? ? ? ?Closing pharmacy CCM case at this time.  Patient has clinic contact information for future questions or concerns.  ? ?[name] Darnelle Maffucci, PharmD, [place], CPP ?Clinical Pharmacist ?Therapist, music at [employer] ?[phone] ? ?

## 2021-08-31 NOTE — Patient Instructions (Signed)
Hi Jalaina,  ? ?I am being asked to quickly transition into another role within the health system, so unfortunately I am unable to keep our next appointment. Please continue to follow up with your primary care provider as scheduled.  ? ?As a reminder -  ? ?The [employer] Patient Assistance Program for [employer] has an auto-refill program where you do not need to call and request a refill each time. You should receive your medication shipment before you run out of your medication. However, if you have any issues or need assistance, you can call them at [phone]. They are available Monday though Friday 8:00 AM - 8:00 PM.  ? ?The [employer] prescription savings program for [place] has an auto-refill program where you do not need to call and request a refill each time. You should receive your medication shipment before you run out of your medication. However, if you have any issues or need assistance, you can call them call [phone]. They are available Monday though Friday 9:00 AM - 6:00 PM.  ? ? ? ?It has been a pleasure working with you! ? ?[name] Darnelle Maffucci, PharmD ? ?

## 2021-09-07 ENCOUNTER — Telehealth: Payer: Self-pay

## 2021-09-07 NOTE — Telephone Encounter (Addendum)
Received patient assistance medication. Pt is aware that medication is ready for pickup. Pt stated that she would be by this afternoon to pick up.  ? ?Ozempic: 4 boxes ?

## 2021-09-07 NOTE — Telephone Encounter (Signed)
Patient picked up Ozempic from patient assistance in our office today@ 12:15 pm ?

## 2021-09-12 ENCOUNTER — Other Ambulatory Visit: Payer: Self-pay | Admitting: Internal Medicine

## 2021-09-12 DIAGNOSIS — E034 Atrophy of thyroid (acquired): Secondary | ICD-10-CM

## 2021-10-26 DIAGNOSIS — R809 Proteinuria, unspecified: Secondary | ICD-10-CM | POA: Diagnosis not present

## 2021-10-26 DIAGNOSIS — I10 Essential (primary) hypertension: Secondary | ICD-10-CM | POA: Diagnosis not present

## 2021-10-26 DIAGNOSIS — E1122 Type 2 diabetes mellitus with diabetic chronic kidney disease: Secondary | ICD-10-CM | POA: Diagnosis not present

## 2021-10-26 DIAGNOSIS — N1831 Chronic kidney disease, stage 3a: Secondary | ICD-10-CM | POA: Diagnosis not present

## 2021-10-30 ENCOUNTER — Telehealth: Payer: Self-pay | Admitting: *Deleted

## 2021-10-30 DIAGNOSIS — E034 Atrophy of thyroid (acquired): Secondary | ICD-10-CM

## 2021-10-30 DIAGNOSIS — E1122 Type 2 diabetes mellitus with diabetic chronic kidney disease: Secondary | ICD-10-CM

## 2021-10-30 DIAGNOSIS — E782 Mixed hyperlipidemia: Secondary | ICD-10-CM

## 2021-10-30 NOTE — Telephone Encounter (Signed)
Please place future orders for lab appt.  

## 2021-11-03 ENCOUNTER — Other Ambulatory Visit (INDEPENDENT_AMBULATORY_CARE_PROVIDER_SITE_OTHER): Payer: Medicare HMO

## 2021-11-03 DIAGNOSIS — E1122 Type 2 diabetes mellitus with diabetic chronic kidney disease: Secondary | ICD-10-CM | POA: Diagnosis not present

## 2021-11-03 DIAGNOSIS — E782 Mixed hyperlipidemia: Secondary | ICD-10-CM

## 2021-11-03 DIAGNOSIS — E034 Atrophy of thyroid (acquired): Secondary | ICD-10-CM | POA: Diagnosis not present

## 2021-11-03 DIAGNOSIS — N1832 Chronic kidney disease, stage 3b: Secondary | ICD-10-CM | POA: Diagnosis not present

## 2021-11-03 LAB — TSH: TSH: 2.12 u[IU]/mL (ref 0.35–5.50)

## 2021-11-03 LAB — HEMOGLOBIN A1C: Hgb A1c MFr Bld: 6.2 % (ref 4.6–6.5)

## 2021-11-03 LAB — LIPID PANEL
Cholesterol: 147 mg/dL (ref 0–200)
HDL: 38.2 mg/dL — ABNORMAL LOW
LDL Cholesterol: 84 mg/dL (ref 0–99)
NonHDL: 108.42
Total CHOL/HDL Ratio: 4
Triglycerides: 120 mg/dL (ref 0.0–149.0)
VLDL: 24 mg/dL (ref 0.0–40.0)

## 2021-11-03 LAB — COMPREHENSIVE METABOLIC PANEL WITH GFR
ALT: 20 U/L (ref 0–35)
AST: 21 U/L (ref 0–37)
Albumin: 4.2 g/dL (ref 3.5–5.2)
Alkaline Phosphatase: 49 U/L (ref 39–117)
BUN: 36 mg/dL — ABNORMAL HIGH (ref 6–23)
CO2: 23 meq/L (ref 19–32)
Calcium: 9.6 mg/dL (ref 8.4–10.5)
Chloride: 106 meq/L (ref 96–112)
Creatinine, Ser: 1.3 mg/dL — ABNORMAL HIGH (ref 0.40–1.20)
GFR: 39.02 mL/min — ABNORMAL LOW
Glucose, Bld: 118 mg/dL — ABNORMAL HIGH (ref 70–99)
Potassium: 4.5 meq/L (ref 3.5–5.1)
Sodium: 138 meq/L (ref 135–145)
Total Bilirubin: 0.4 mg/dL (ref 0.2–1.2)
Total Protein: 7.5 g/dL (ref 6.0–8.3)

## 2021-11-03 LAB — MICROALBUMIN / CREATININE URINE RATIO
Creatinine,U: 99.3 mg/dL
Microalb Creat Ratio: 2.5 mg/g (ref 0.0–30.0)
Microalb, Ur: 2.4 mg/dL — ABNORMAL HIGH (ref 0.0–1.9)

## 2021-11-06 ENCOUNTER — Ambulatory Visit (INDEPENDENT_AMBULATORY_CARE_PROVIDER_SITE_OTHER): Payer: Medicare HMO | Admitting: Internal Medicine

## 2021-11-06 ENCOUNTER — Encounter: Payer: Self-pay | Admitting: Internal Medicine

## 2021-11-06 VITALS — BP 114/64 | HR 72 | Temp 98.2°F | Ht 65.0 in | Wt 192.8 lb

## 2021-11-06 DIAGNOSIS — E669 Obesity, unspecified: Secondary | ICD-10-CM | POA: Diagnosis not present

## 2021-11-06 DIAGNOSIS — I10 Essential (primary) hypertension: Secondary | ICD-10-CM

## 2021-11-06 DIAGNOSIS — E875 Hyperkalemia: Secondary | ICD-10-CM

## 2021-11-06 DIAGNOSIS — R011 Cardiac murmur, unspecified: Secondary | ICD-10-CM

## 2021-11-06 DIAGNOSIS — E782 Mixed hyperlipidemia: Secondary | ICD-10-CM

## 2021-11-06 DIAGNOSIS — N1832 Chronic kidney disease, stage 3b: Secondary | ICD-10-CM | POA: Diagnosis not present

## 2021-11-06 DIAGNOSIS — Z1231 Encounter for screening mammogram for malignant neoplasm of breast: Secondary | ICD-10-CM | POA: Diagnosis not present

## 2021-11-06 DIAGNOSIS — E034 Atrophy of thyroid (acquired): Secondary | ICD-10-CM | POA: Diagnosis not present

## 2021-11-06 DIAGNOSIS — E1122 Type 2 diabetes mellitus with diabetic chronic kidney disease: Secondary | ICD-10-CM

## 2021-11-06 DIAGNOSIS — I8510 Secondary esophageal varices without bleeding: Secondary | ICD-10-CM

## 2021-11-06 DIAGNOSIS — K760 Fatty (change of) liver, not elsewhere classified: Secondary | ICD-10-CM | POA: Diagnosis not present

## 2021-11-06 NOTE — Assessment & Plan Note (Signed)
Thyroid function is WNL on current levothyroxine  dose.  No current changes needed.  ?

## 2021-11-06 NOTE — Assessment & Plan Note (Signed)
Continue metoprolol. 

## 2021-11-06 NOTE — Assessment & Plan Note (Signed)
Repeat liver enzymeshave normalized in spite of stable weight. ? ?Lab Results  ?Component Value Date  ? ALT 20 11/03/2021  ? AST 21 11/03/2021  ? ALKPHOS 49 11/03/2021  ? BILITOT 0.4 11/03/2021  ? ? ?

## 2021-11-06 NOTE — Assessment & Plan Note (Addendum)
.  complicated by proteinuria and  and diabetic retinopathy.  Improved control with ozempic and [place]. . Dose increased to 2.0 mg weekly as a trial, based on today's exam.  Reviewed her visit with Dr Holley Raring her nephrologist; GFR is stable but she was hyperkalemic last week. ? ?Lab Results  ?Component Value Date  ? HGBA1C 6.2 11/03/2021  ? ?Lab Results  ?Component Value Date  ? NA 138 11/03/2021  ? K 4.5 11/03/2021  ? CL 106 11/03/2021  ? CO2 23 11/03/2021  ? ?Lab Results  ?Component Value Date  ? CREATININE 1.30 (H) 11/03/2021  ? ? ? ? ?

## 2021-11-06 NOTE — Assessment & Plan Note (Signed)
Well controlled on current regimen metoprolol telmisartan and amlodipine.   Renal function stable, no changes today. ? ?Lab Results  ?Component Value Date  ? CREATININE 1.30 (H) 11/03/2021  ? ?Lab Results  ?Component Value Date  ? MICROALBUR 2.4 (H) 11/03/2021  ? MICROALBUR 34.3 (H) 12/21/2020  ? ? ? ? ?

## 2021-11-06 NOTE — Assessment & Plan Note (Signed)
encouarged to increase dose of ozempic to 2 mg weekly using the 1 mg pens she has in quantity ?

## 2021-11-06 NOTE — Patient Instructions (Signed)
Your diabetes remains under excellent control  And your thyroid  and other labs are also normal.  ? ?Your  urine test for protein showed less protein than last time which is GOOD !. Please continue your current medications, but try increasing your ozempic dose to 2 mg weekly (give yourself 2 doses )  to see if you can tolerate it . ? ? ?I have  made a referral to [facility] for  your dilated retina exam to monitor for diabetic retinopathy ? ?Your annual mammogram has been ordered and is due in September .  You are encouraged (required) to call to make your appointment at [facility]  ? ?I have ordered an ECHO (ultrasound of the heart) to investigate the heart murmur I and Dr Holley Raring hear.  ? ? ? ?1)  Please do not use the After Hours call service to cancel an appointment if it is less than 24 hours in advance.  Those messages will not be read in time to cancel your appt and fill it with another patient.  You will  be charged a cancellation fee of $50 .  If this scenario happens,  you would be advised to turn your visit into a virtual visit (or telephone,  if virtual is not possible)  ? ?2) Please do not use MyChart to discuss new problems with me.  I have no interest in charging you for a MyChart visit.  You will need to schedule an appointment. ?  ? ?  ?

## 2021-11-06 NOTE — Assessment & Plan Note (Signed)
Resolved with dietary restraint ? ?Lab Results  ?Component Value Date  ? NA 138 11/03/2021  ? K 4.5 11/03/2021  ? CL 106 11/03/2021  ? CO2 23 11/03/2021  ? ? ?

## 2021-11-06 NOTE — Progress Notes (Signed)
 "  Subjective:  Patient ID: Marissa Rodriguez, female    DOB: 1941/04/30  Age: 81 y.o. MRN: [id number]  CC: The primary encounter diagnosis was Essential hypertension. Diagnoses of Hypothyroidism due to acquired atrophy of thyroid , Type 2 diabetes mellitus with stage 3b chronic kidney disease, without long-term current use of insulin (HCC), Mixed hyperlipidemia, Breast cancer screening by mammogram, Systolic murmur, Hyperkalemia, Obesity (BMI 30-39.9), NAFLD (nonalcoholic fatty liver disease), and Secondary esophageal varices without bleeding (HCC) were also pertinent to this visit.   HPI Marissa Rodriguez presents for  Chief Complaint  Patient presents with   Follow-up    6 month follow up    1) T2DM:   T2DM:  She  feels generally well,  But is not  exercising regularly or losing  weight. Checking  blood sugars less than once daily at variable times, usually only if she feels she may be having a hypoglycemic event. .  BS have been under 130 fasting and < 150 post prandially.  Denies any recent hypoglyemic events.  Taking   ozempic  without nausea ,  and farxiga  as directed. Following a carbohydrate modified diet 6 days per week. Denies numbness, burning and tingling of extremities. Appetite is good.   Cataract surgery was done in [place].    2) sleeping fine  without meds   3) CKD:  seeing nephrology  , last week her potassium was 5.4  had reduced intake of melons due to elevation of potassium ; repeat level has improved    4) Systolic murmur:  chronic ,  asymptomatic  no prior evaluation   Outpatient Medications Prior to Visit  Medication Sig Dispense Refill   amLODipine  (NORVASC ) 5 MG tablet Take 1 tablet by mouth once daily 90 tablet 1   atorvastatin  (LIPITOR) 20 MG tablet Take 1 tablet (20 mg total) by mouth daily. 90 tablet 3   blood glucose meter kit and supplies Dispense based on patient and insurance preference. Use to check blood sugars twice daily. (FOR ICD-10 E11.69). 1 each 0   FARXIGA  10  MG TABS tablet TAKE 1 TABLET BY MOUTH ONCE DAILY BEFORE BREAKFAST 30 tablet 0   glucose blood (ONETOUCH VERIO) test strip USE TO TEST BLOOD SUGAR UP TO TWICE DAILY 100 each 0   Lancets (ONETOUCH DELICA PLUS LANCET33G) MISC USE TO TEST BLOOD SUGAR TWICE DAILY     levothyroxine  (SYNTHROID ) 100 MCG tablet TAKE 1 TABLET BY MOUTH ONCE DAILY BEFORE BREAKFAST 90 tablet 0   metoprolol  succinate (TOPROL -XL) 25 MG 24 hr tablet TAKE 1 TABLET BY MOUTH AT BEDTIME 90 tablet 1   Omega-3 Fatty Acids (FISH OIL) 1000 MG CAPS Take 1 capsule by mouth daily.     Semaglutide , 1 MG/DOSE, (OZEMPIC , 1 MG/DOSE,) 4 MG/3ML SOPN Inject 1 mg into the skin once a week. 3 mL 0   telmisartan  (MICARDIS ) 80 MG tablet Take 1 tablet (80 mg total) by mouth daily. 90 tablet 3   No facility-administered medications prior to visit.    Review of Systems;  Patient denies headache, fevers, malaise, unintentional weight loss, skin rash, eye pain, sinus congestion and sinus pain, sore throat, dysphagia,  hemoptysis , cough, dyspnea, wheezing, chest pain, palpitations, orthopnea, edema, abdominal pain, nausea, melena, diarrhea, constipation, flank pain, dysuria, hematuria, urinary  Frequency, nocturia, numbness, tingling, seizures,  Focal weakness, Loss of consciousness,  Tremor, insomnia, depression, anxiety, and suicidal ideation.      Objective:  BP 114/64 (BP Location: Left Arm, Patient Position: Sitting, Cuff  Size: Large)   Pulse 72   Temp 98.2 F (36.8 C) (Oral)   Ht 5' 5 (1.651 m)   Wt 192 lb 12.8 oz (87.5 kg)   SpO2 96%   BMI 32.08 kg/m   BP Readings from Last 3 Encounters:  11/06/21 114/64  04/26/21 124/66  01/11/21 (!) 142/70    Wt Readings from Last 3 Encounters:  11/06/21 192 lb 12.8 oz (87.5 kg)  05/16/21 190 lb (86.2 kg)  04/26/21 197 lb 6.4 oz (89.5 kg)    General appearance: alert, cooperative and appears stated age Ears: normal TM's and external ear canals both ears Throat: lips, mucosa, and tongue  normal; teeth and gums normal Neck: no adenopathy, no carotid bruit, supple, symmetrical, trachea midline and thyroid  not enlarged, symmetric, no tenderness/mass/nodules Back: symmetric, no curvature. ROM normal. No CVA tenderness. Lungs: clear to auscultation bilaterally Heart: regular rate and rhythm, S1, S2 normal, no murmur, click, rub or gallop Abdomen: soft, non-tender; bowel sounds normal; no masses,  no organomegaly Pulses: 2+ and symmetric Skin: Skin color, texture, turgor normal. No rashes or lesions Lymph nodes: Cervical, supraclavicular, and axillary nodes normal.  Lab Results  Component Value Date   HGBA1C 6.2 11/03/2021   HGBA1C 6.6 (H) 04/26/2021   HGBA1C 8.1 (H) 12/21/2020    Lab Results  Component Value Date   CREATININE 1.30 (H) 11/03/2021   CREATININE 1.26 (H) 04/26/2021   CREATININE 1.12 02/01/2021    Lab Results  Component Value Date   WBC 9.4 02/19/2019   HGB 12.7 02/19/2019   HCT 38.6 02/19/2019   PLT 262.0 02/19/2019   GLUCOSE 118 (H) 11/03/2021   CHOL 147 11/03/2021   TRIG 120.0 11/03/2021   HDL 38.20 (L) 11/03/2021   LDLDIRECT 113.0 09/22/2015   LDLCALC 84 11/03/2021   ALT 20 11/03/2021   AST 21 11/03/2021   NA 138 11/03/2021   K 4.5 11/03/2021   CL 106 11/03/2021   CREATININE 1.30 (H) 11/03/2021   BUN 36 (H) 11/03/2021   CO2 23 11/03/2021   TSH 2.12 11/03/2021   HGBA1C 6.2 11/03/2021   MICROALBUR 2.4 (H) 11/03/2021    MM 3D SCREEN BREAST BILATERAL  Result Date: 02/26/2021 CLINICAL DATA:  Screening. EXAM: DIGITAL SCREENING BILATERAL MAMMOGRAM WITH TOMOSYNTHESIS AND CAD TECHNIQUE: Bilateral screening digital craniocaudal and mediolateral oblique mammograms were obtained. Bilateral screening digital breast tomosynthesis was performed. The images were evaluated with computer-aided detection. COMPARISON:  Previous exam(s). ACR Breast Density Category b: There are scattered areas of fibroglandular density. FINDINGS: There are no findings  suspicious for malignancy. IMPRESSION: No mammographic evidence of malignancy. A result letter of this screening mammogram will be mailed directly to the patient. RECOMMENDATION: Screening mammogram in one year. (Code:SM-B-01Y) BI-RADS CATEGORY  1: Negative. Electronically Signed   By: Craig Farr M.D.   On: 02/26/2021 09:21   Assessment & Plan:   Problem List Items Addressed This Visit     Hyperlipidemia   Relevant Orders   Lipid panel   Essential hypertension - Primary    Well controlled on current regimen metoprolol  telmisartan  and amlodipine .   Renal function stable, no changes today.  Lab Results  Component Value Date   CREATININE 1.30 (H) 11/03/2021   Lab Results  Component Value Date   MICROALBUR 2.4 (H) 11/03/2021   MICROALBUR 34.3 (H) 12/21/2020           Relevant Orders   Comprehensive metabolic panel   Hyperkalemia    Resolved with dietary restraint  Lab Results  Component Value Date   NA 138 11/03/2021   K 4.5 11/03/2021   CL 106 11/03/2021   CO2 23 11/03/2021         Hypothyroidism    Thyroid  function is WNL on current levothyroxine   dose.  No current changes needed.        Relevant Orders   TSH   NAFLD (nonalcoholic fatty liver disease)    Repeat liver enzymeshave normalized in spite of stable weight.  Lab Results  Component Value Date   ALT 20 11/03/2021   AST 21 11/03/2021   ALKPHOS 49 11/03/2021   BILITOT 0.4 11/03/2021         Obesity (BMI 30-39.9)    encouarged to increase dose of ozempic  to 2 mg weekly using the 1 mg pens she has in quantity       Secondary esophageal varices without bleeding (HCC)    Continue metoprolol         Systolic murmur    No prior evaluation   No symptoms. [name]  ECHO ordered       Relevant Orders   ECHOCARDIOGRAM COMPLETE   Type 2 diabetes mellitus with stage 3 chronic kidney disease (HCC)    .complicated by proteinuria and  and diabetic retinopathy.  Improved control with ozempic  and Farxiga .  . Dose increased to 2.0 mg weekly as a trial, based on today's exam.  Reviewed her visit with Dr Marcelino her nephrologist; GFR is stable but she was hyperkalemic last week.  Lab Results  Component Value Date   HGBA1C 6.2 11/03/2021   Lab Results  Component Value Date   NA 138 11/03/2021   K 4.5 11/03/2021   CL 106 11/03/2021   CO2 23 11/03/2021   Lab Results  Component Value Date   CREATININE 1.30 (H) 11/03/2021           Relevant Orders   Hemoglobin A1c   Comprehensive metabolic panel   Ambulatory referral to Ophthalmology   Other Visit Diagnoses     Breast cancer screening by mammogram       Relevant Orders   MM 3D SCREEN BREAST BILATERAL       I spent a total of  34   minutes with this patient in a face to face visit on the date of this encounter reviewing the last office visit with me in November,  her  most recent with patient's  nephrologist last week,  .  Her diet and eating habits, home blood pressure readings ,  and pre  visit ordering of testing and therapeutics.    Follow-up: No follow-ups on file.   Verneita LITTIE Kettering, MD "

## 2021-11-06 NOTE — Assessment & Plan Note (Signed)
No prior evaluation   No symptoms. Marland Kitchen  ECHO ordered ?

## 2021-11-13 ENCOUNTER — Telehealth: Payer: Self-pay | Admitting: Internal Medicine

## 2021-11-13 MED ORDER — SEMAGLUTIDE (2 MG/DOSE) 8 MG/3ML ~~LOC~~ SOPN: 2.0000 mg | PEN_INJECTOR | SUBCUTANEOUS | 2 refills | Status: DC

## 2021-11-13 NOTE — Telephone Encounter (Signed)
Pt called stating she can tolerate the '2mg'$  with no side effects for ozempic and she need refill on ozempic

## 2021-11-13 NOTE — Telephone Encounter (Signed)
Form for novo nordisk has been completed and placed in quick sign folder.

## 2021-11-13 NOTE — Telephone Encounter (Signed)
2 mg ozempic dose sent to pharmacy

## 2021-11-13 NOTE — Telephone Encounter (Signed)
The 1 mg pill is the dose in her current medication list. Is it okay to refill at the 2 mg dose?

## 2021-11-14 NOTE — Telephone Encounter (Signed)
faxed

## 2021-12-03 ENCOUNTER — Other Ambulatory Visit: Payer: Self-pay | Admitting: Internal Medicine

## 2021-12-03 DIAGNOSIS — E034 Atrophy of thyroid (acquired): Secondary | ICD-10-CM

## 2021-12-04 ENCOUNTER — Telehealth: Payer: Self-pay

## 2021-12-04 NOTE — Telephone Encounter (Signed)
Received pt's pt assistance medication in the office today. Pt is aware and stated that she would pick it up tomorrow.   Ozempic: 4 boxes

## 2021-12-22 DIAGNOSIS — E119 Type 2 diabetes mellitus without complications: Secondary | ICD-10-CM | POA: Diagnosis not present

## 2021-12-22 DIAGNOSIS — H348112 Central retinal vein occlusion, right eye, stable: Secondary | ICD-10-CM | POA: Diagnosis not present

## 2021-12-22 LAB — HM DIABETES EYE EXAM

## 2021-12-31 ENCOUNTER — Other Ambulatory Visit: Payer: Self-pay | Admitting: Internal Medicine

## 2022-01-10 ENCOUNTER — Telehealth: Payer: Medicare HMO

## 2022-01-12 ENCOUNTER — Other Ambulatory Visit: Payer: Self-pay | Admitting: Internal Medicine

## 2022-01-29 ENCOUNTER — Telehealth: Payer: Self-pay | Admitting: Internal Medicine

## 2022-01-29 NOTE — Telephone Encounter (Signed)
Pt called in requesting to speak with you... Pt stated that she have questions about her medication (Semaglutide, 2 MG/DOSE, 8 MG/3ML SOPN)... Pt requesting callback

## 2022-01-30 NOTE — Telephone Encounter (Signed)
Pt would like to be called regarding her medcation

## 2022-02-01 NOTE — Telephone Encounter (Signed)
Spoke with pt and she stated that she is out of her Ozempic because [employer] keeps sending her the 1 mg pens and she has been increased to 2 mg once weekly. I have spoke with [employer] this morning to figure out what was going on and they stated that the new order we faxed back in may for the 2 mg pen they did not receive the second page that had the dose change on it. I have printed and refaxed that order this morning. However the pt will need samples to get her through. We don not have any 1 mg or 2 mg pens in the office we only have the 0.5 mg dose. She I give the pt enough to equal 2 mgs or have her go back to the 1 mg until we receive the correct dose from [employer].

## 2022-02-01 NOTE — Telephone Encounter (Signed)
Sample is set aside and pt is aware.   Medication Samples have been provided to the patient.  Drug name: Ozempic       Strength: '2mg'$ /62m        Qty: 1 box  LOT: [id number] Exp.Date: 07/26/2023  Dosing instructions: Inject 1 mg into skin once weekly by giving your two injections of 0.5 mg.   The patient has been instructed regarding the correct time, dose, and frequency of taking this medication, including desired effects and most common side effects.   Marissa Rodriguez 5:50 PM 02/01/2022

## 2022-02-01 NOTE — Telephone Encounter (Signed)
Marissa Rodriguez has reached out to the rep this morning to see if they can bring [place] some samples of the 2 mg pens. If they aren't able to or can't get them here before tomorrow is it okay for the pt to skip several weeks of doses because her shipment of the wrong dose will not be here until 8/29/203 so it will take longer for the 2 mg dose to get here from [employer] pt assistance.

## 2022-02-05 ENCOUNTER — Telehealth: Payer: Self-pay

## 2022-02-05 NOTE — Telephone Encounter (Signed)
Patient came in to pick up Sample ozempic on 02/05/22 '@12'$ :36pm

## 2022-02-19 ENCOUNTER — Other Ambulatory Visit: Payer: Self-pay | Admitting: Internal Medicine

## 2022-02-19 DIAGNOSIS — Z1231 Encounter for screening mammogram for malignant neoplasm of breast: Secondary | ICD-10-CM

## 2022-02-25 ENCOUNTER — Other Ambulatory Visit: Payer: Self-pay | Admitting: Internal Medicine

## 2022-02-25 DIAGNOSIS — E034 Atrophy of thyroid (acquired): Secondary | ICD-10-CM

## 2022-03-01 DIAGNOSIS — R809 Proteinuria, unspecified: Secondary | ICD-10-CM | POA: Diagnosis not present

## 2022-03-01 DIAGNOSIS — I10 Essential (primary) hypertension: Secondary | ICD-10-CM | POA: Diagnosis not present

## 2022-03-01 DIAGNOSIS — E1122 Type 2 diabetes mellitus with diabetic chronic kidney disease: Secondary | ICD-10-CM | POA: Diagnosis not present

## 2022-03-01 DIAGNOSIS — N1831 Chronic kidney disease, stage 3a: Secondary | ICD-10-CM | POA: Diagnosis not present

## 2022-03-12 ENCOUNTER — Telehealth: Payer: Self-pay | Admitting: Internal Medicine

## 2022-03-12 NOTE — Telephone Encounter (Signed)
Pt still has not received her refill for the 1 mg dose of Ozempic from [employer]. It is okay to give pt some samples of the 0.5 mg and have her take two at time until we receive her Ozempic here in the office?

## 2022-03-12 NOTE — Telephone Encounter (Signed)
Patient needs a refill on her OZempic. Patient is out and did not have an injection to give herself on Saturday.

## 2022-03-13 ENCOUNTER — Ambulatory Visit
Admission: RE | Admit: 2022-03-13 | Discharge: 2022-03-13 | Disposition: A | Discharge status: Home / Self Care | Payer: Medicare HMO | Source: Ambulatory Visit | Attending: Internal Medicine | Admitting: Internal Medicine

## 2022-03-13 DIAGNOSIS — Z1231 Encounter for screening mammogram for malignant neoplasm of breast: Secondary | ICD-10-CM | POA: Insufficient documentation

## 2022-03-13 NOTE — Telephone Encounter (Signed)
Medication Samples have been provided to the patient.  Drug name: Ozempic       Strength: '2mg'$ /62m        Qty: 1 box  LOT:: [id number] Exp.Date: 07/26/2023  Dosing instructions: Inject 1 mg once weekly  The patient has been instructed regarding the correct time, dose, and frequency of taking this medication, including desired effects and most common side effects.   Charbel Los 11:02 AM 03/13/2022

## 2022-03-13 NOTE — Telephone Encounter (Signed)
Pt has picked up sample.

## 2022-03-13 NOTE — Telephone Encounter (Signed)
Pt is aware sample it ready for pick up.

## 2022-03-14 ENCOUNTER — Telehealth: Payer: Self-pay

## 2022-03-14 NOTE — Telephone Encounter (Signed)
Pt picked up patient assistance Ozempic 4 boxes today at 4:15p.

## 2022-03-14 NOTE — Telephone Encounter (Signed)
Received pt's patient assistance medication in the office. Pt is aware it is ready for pick up.   Ozempic: 4 boxes

## 2022-03-15 NOTE — Telephone Encounter (Signed)
Pt called wanting to talk to the cma about her dosage of ozempic. Pt stated she has taken '1mg'$  but the ozempic she picked up has 2 mg to take

## 2022-03-15 NOTE — Telephone Encounter (Signed)
Spoke with pt and clarified with her that she is supposed to increase to the '2mg'$  dose now. Pt gave a verbal understanding.

## 2022-03-27 ENCOUNTER — Ambulatory Visit (INDEPENDENT_AMBULATORY_CARE_PROVIDER_SITE_OTHER): Payer: Medicare HMO

## 2022-03-27 VITALS — Ht 65.0 in | Wt 192.0 lb

## 2022-03-27 DIAGNOSIS — Z0000 Encounter for general adult medical examination without abnormal findings: Secondary | ICD-10-CM

## 2022-03-27 NOTE — Patient Instructions (Addendum)
Marissa Rodriguez , Thank you for taking time to come for your Medicare Wellness Visit. I appreciate your ongoing commitment to your health goals. Please review the following plan we discussed and let me know if I can assist you in the future.   These are the goals we discussed:  Goals       Patient Stated     Healthy diet (pt-stated)      Other     Increase physical activity      I would like to start walking again for exercise.         This is a list of the screening recommended for you and due dates:  Health Maintenance  Topic Date Due   Zoster (Shingles) Vaccine (1 of 2) 06/27/2022*   Flu Shot  09/23/2022*   Hemoglobin A1C  05/06/2022   Tetanus Vaccine  10/11/2022   Yearly kidney function blood test for diabetes  11/04/2022   Complete foot exam   11/07/2022   Eye exam for diabetics  12/23/2022   Yearly kidney health urinalysis for diabetes  03/02/2023   Mammogram  03/14/2023   Colon Cancer Screening  03/18/2024   Pneumonia Vaccine  Completed   DEXA scan (bone density measurement)  Completed   HPV Vaccine  Aged Out   COVID-19 Vaccine  Discontinued  *Topic was postponed. The date shown is not the original due date.    Advanced directives: not yet completed  Conditions/risks identified: none new  Next appointment: Follow up in one year for your annual wellness visit    Preventive Care 65 Years and Older, Female Preventive care refers to lifestyle choices and visits with your health care provider that can promote health and wellness. What does preventive care include? A yearly physical exam. This is also called an annual well check. Dental exams once or twice a year. Routine eye exams. Ask your health care provider how often you should have your eyes checked. Personal lifestyle choices, including: Daily care of your teeth and gums. Regular physical activity. Eating a healthy diet. Avoiding tobacco and drug use. Limiting alcohol use. Practicing safe sex. Taking  low-dose aspirin every day. Taking vitamin and mineral supplements as recommended by your health care provider. What happens during an annual well check? The services and screenings done by your health care provider during your annual well check will depend on your age, overall health, lifestyle risk factors, and family history of disease. Counseling  Your health care provider may ask you questions about your: Alcohol use. Tobacco use. Drug use. Emotional well-being. Home and relationship well-being. Sexual activity. Eating habits. History of falls. Memory and ability to understand (cognition). Work and work Statistician. Reproductive health. Screening  You may have the following tests or measurements: Height, weight, and BMI. Blood pressure. Lipid and cholesterol levels. These may be checked every 5 years, or more frequently if you are over 35 years old. Skin check. Lung cancer screening. You may have this screening every year starting at age 42 if you have a 30-pack-year history of smoking and currently smoke or have quit within the past 15 years. Fecal occult blood test (FOBT) of the stool. You may have this test every year starting at age 68. Flexible sigmoidoscopy or colonoscopy. You may have a sigmoidoscopy every 5 years or a colonoscopy every 10 years starting at age 76. Hepatitis C blood test. Hepatitis B blood test. Sexually transmitted disease (STD) testing. Diabetes screening. This is done by checking your blood sugar (glucose) after  you have not eaten for a while (fasting). You may have this done every 1-3 years. Bone density scan. This is done to screen for osteoporosis. You may have this done starting at age 83. Mammogram. This may be done every 1-2 years. Talk to your health care provider about how often you should have regular mammograms. Talk with your health care provider about your test results, treatment options, and if necessary, the need for more tests. Vaccines   Your health care provider may recommend certain vaccines, such as: Influenza vaccine. This is recommended every year. Tetanus, diphtheria, and acellular pertussis (Tdap, Td) vaccine. You may need a Td booster every 10 years. Zoster vaccine. You may need this after age 36. Pneumococcal 13-valent conjugate (PCV13) vaccine. One dose is recommended after age 81. Pneumococcal polysaccharide (PPSV23) vaccine. One dose is recommended after age 37. Talk to your health care provider about which screenings and vaccines you need and how often you need them. This information is not intended to replace advice given to you by your health care provider. Make sure you discuss any questions you have with your health care provider. Document Released: 07/08/2015 Document Revised: 02/29/2016 Document Reviewed: 04/12/2015 Elsevier Interactive Patient Education  2017 [place] Prevention in the Home Falls can cause injuries. They can happen to people of all ages. There are many things you can do to make your home safe and to help prevent falls. What can I do on the outside of my home? Regularly fix the edges of walkways and driveways and fix any cracks. Remove anything that might make you trip as you walk through a door, such as a raised step or threshold. Trim any bushes or trees on the path to your home. Use bright outdoor lighting. Clear any walking paths of anything that might make someone trip, such as rocks or tools. Regularly check to see if handrails are loose or broken. Make sure that both sides of any steps have handrails. Any raised decks and porches should have guardrails on the edges. Have any leaves, snow, or ice cleared regularly. Use sand or salt on walking paths during winter. Clean up any spills in your garage right away. This includes oil or grease spills. What can I do in the bathroom? Use night lights. Install grab bars by the toilet and in the tub and shower. Do not use towel  bars as grab bars. Use non-skid mats or decals in the tub or shower. If you need to sit down in the shower, use a plastic, non-slip stool. Keep the floor dry. Clean up any water that spills on the floor as soon as it happens. Remove soap buildup in the tub or shower regularly. Attach bath mats securely with double-sided non-slip rug tape. Do not have throw rugs and other things on the floor that can make you trip. What can I do in the bedroom? Use night lights. Make sure that you have a light by your bed that is easy to reach. Do not use any sheets or blankets that are too big for your bed. They should not hang down onto the floor. Have a firm chair that has side arms. You can use this for support while you get dressed. Do not have throw rugs and other things on the floor that can make you trip. What can I do in the kitchen? Clean up any spills right away. Avoid walking on wet floors. Keep items that you use a lot in easy-to-reach places. If you  need to reach something above you, use a strong step stool that has a grab bar. Keep electrical cords out of the way. Do not use floor polish or wax that makes floors slippery. If you must use wax, use non-skid floor wax. Do not have throw rugs and other things on the floor that can make you trip. What can I do with my stairs? Do not leave any items on the stairs. Make sure that there are handrails on both sides of the stairs and use them. Fix handrails that are broken or loose. Make sure that handrails are as long as the stairways. Check any carpeting to make sure that it is firmly attached to the stairs. Fix any carpet that is loose or worn. Avoid having throw rugs at the top or bottom of the stairs. If you do have throw rugs, attach them to the floor with carpet tape. Make sure that you have a light switch at the top of the stairs and the bottom of the stairs. If you do not have them, ask someone to add them for you. What else can I do to help  prevent falls? Wear shoes that: Do not have high heels. Have rubber bottoms. Are comfortable and fit you well. Are closed at the toe. Do not wear sandals. If you use a stepladder: Make sure that it is fully opened. Do not climb a closed stepladder. Make sure that both sides of the stepladder are locked into place. Ask someone to hold it for you, if possible. Clearly mark and make sure that you can see: Any grab bars or handrails. First and last steps. Where the edge of each step is. Use tools that help you move around (mobility aids) if they are needed. These include: Canes. Walkers. Scooters. Crutches. Turn on the lights when you go into a dark area. Replace any light bulbs as soon as they burn out. Set up your furniture so you have a clear path. Avoid moving your furniture around. If any of your floors are uneven, fix them. If there are any pets around you, be aware of where they are. Review your medicines with your doctor. Some medicines can make you feel dizzy. This can increase your chance of falling. Ask your doctor what other things that you can do to help prevent falls. This information is not intended to replace advice given to you by your health care provider. Make sure you discuss any questions you have with your health care provider. Document Released: 04/07/2009 Document Revised: 11/17/2015 Document Reviewed: 07/16/2014 Elsevier Interactive Patient Education  2017 [employer].

## 2022-03-27 NOTE — Progress Notes (Signed)
Subjective:   Marissa Rodriguez is a 81 y.o. female who presents for Medicare Annual (Subsequent) preventive examination.  Review of Systems    No ROS.  Medicare Wellness Virtual Visit.  Visual/audio telehealth visit, UTA vital signs.   See social history for additional risk factors.   Cardiac Risk Factors include: advanced age (>72mn, >>35women)     Objective:    Today's Vitals   03/27/22 0921  Weight: 192 lb (87.1 kg)  Height: 5' 5" (1.651 m)   Body mass index is 31.95 kg/m.     03/27/2022    9:24 AM 03/14/2021    9:54 AM 03/11/2020   10:50 AM 03/19/2019    9:38 AM 03/09/2019    9:30 AM 03/05/2018    9:50 AM 07/22/2015   11:26 AM  Advanced Directives  Does Patient Have a Medical Advance Directive? _0  No No  Would patient like information on creating a medical advance directive? No - Patient declined No - Patient declined No - Patient declined No - Patient declined No - Patient declined Yes (MAU/Ambulatory/Procedural Areas - Information given) No - patient declined information    Current Medications (verified) Outpatient Encounter Medications as of 03/27/2022  Medication Sig   amLODipine (NORVASC) 5 MG tablet Take 1 tablet by mouth once daily   atorvastatin (LIPITOR) 20 MG tablet Take 1 tablet (20 mg total) by mouth daily.   blood glucose meter kit and supplies Dispense based on patient and insurance preference. Use to check blood sugars twice daily. (FOR ICD-10 E11.69).   FARXIGA 10 MG TABS tablet TAKE 1 TABLET BY MOUTH ONCE DAILY BEFORE BREAKFAST   glucose blood (ONETOUCH VERIO) test strip USE TO TEST BLOOD SUGAR UP TO TWICE DAILY   Lancets (ONETOUCH DELICA PLUS [id number] MISC USE TO TEST BLOOD SUGAR TWICE DAILY   levothyroxine (SYNTHROID) 100 MCG tablet TAKE 1 TABLET BY MOUTH ONCE DAILY BEFORE BREAKFAST   metoprolol succinate (TOPROL-XL) 25 MG 24 hr tablet TAKE 1 TABLET BY MOUTH AT BEDTIME   Omega-3 Fatty Acids (FISH OIL) 1000 MG CAPS Take 1 capsule by mouth  daily.   Semaglutide, 2 MG/DOSE, 8 MG/3ML SOPN Inject 2 mg as directed once a week.   telmisartan (MICARDIS) 80 MG tablet Take 1 tablet (80 mg total) by mouth daily.   No facility-administered encounter medications on file as of 03/27/2022.    Allergies (verified) Patient has no known allergies.   History: Past Medical History:  Diagnosis Date   Closed fracture of part of upper end of humerus 09/24/2015   Di[place] mellitus without complication [place]    History of MRI of brain and brain stem May 2012   normal MRI/MRA done to rule out CVA   Hyperlipidemia    Hypertension    Hypothyroidism    Tubular adenoma of colon Nov 2010   repeat due 2015   Past Surgical History:  Procedure Laterality Date   ABDOMINAL HYSTERECTOMY  1975   TAH/LSO, wedge resection of right   APPENDECTOMY  1975   incidental, done during TAH   COLONOSCOPY WITH PROPOFOL N/A 03/19/2019   Procedure: COLONOSCOPY WITH BIOPSIES;  Surgeon: WLucilla Lame MD;  [place] [facility]  Service: Endoscopy;  Laterality: N/A;  Diabetic   POLYPECTOMY N/A 03/19/2019   Procedure: POLYPECTOMY;  Surgeon: WLucilla Lame MD;  [place] [facility]  Service: Endoscopy;  Laterality: N/A;   Family History  Problem Relation Age of Onset   Cancer Father  Lung    Breast cancer Sister 70   Social History   Socioeconomic History   Marital status: Widowed    Spouse name: Not on file   Number of children: Not on file   Years of education: Not on file   Highest education level: Not on file  Occupational History   Not on file  Tobacco Use   Smoking status: Never   Smokeless tobacco: Never  Vaping Use   Vaping Use: Never used  Substance and Sexual Activity   Alcohol use: No   Drug use: No   Sexual activity: Not on file  Other Topics Concern   Not on file  Social History Narrative   Not on file   Social Determinants of Health   Financial Resource Strain: Low Risk  (03/27/2022)   Overall Financial  Resource Strain (CARDIA)    Difficulty of Paying Living Expenses: Not very hard  Food Insecurity: No Food Insecurity (03/27/2022)   Hunger Vital Sign    Worried About Running Out of Food in the Last Year: Never true    Ran Out of Food in the Last Year: Never true  Transportation Needs: No Transportation Needs (03/27/2022)   PRAPARE - Hydrologist (Medical): No    Lack of Transportation (Non-Medical): No  Physical Activity: Not on file  Stress: No Stress Concern Present (03/27/2022)   [place]    Feeling of Stress : Not at all  Social Connections: Unknown (03/27/2022)   Social Connection and Isolation Panel [NHANES]    Frequency of Communication with Friends and Family: More than three times a week    Frequency of Social Gatherings with Friends and Family: More than three times a week    Attends Religious Services: More than 4 times per year    Active Member of [employer] or Organizations: Yes    Attends Music therapist: More than 4 times per year    Marital Status: Not on file    Tobacco Counseling Counseling given: Not Answered   Clinical Intake:                 Nutrition Risk Assessment: Has the patient had any N/V/D within the last 2 months?  No  Does the patient have any non-healing wounds?  No  Has the patient had any unintentional weight loss or weight gain?  No   Diabetes: Is the patient diabetic?  Yes  If diabetic, was a CBG obtained today?  No  Did the patient bring in their glucometer from home?  No  How often do you monitor your CBG's? Once daily.   Financial Strains and Diabetes Management: Are you having any financial strains with the device, your supplies or your medication? No .  Does the patient want to be seen by Chronic Care Management for management of their diabetes?  No  Would the patient like to be referred to a Nutritionist or for Diabetic  Management?  No   Diabetic Exams:  Diabetic Eye Exam: Completed 12/22/21 Diabetic Foot Exam: Completed 11/05/21         Activities of Daily Living    03/27/2022    9:30 AM  In your present state of health, do you have any difficulty performing the following activities:  Hearing? 0  Vision? 0  Difficulty concentrating or making decisions? 0  Walking or climbing stairs? 0  Dressing or bathing? 0  Doing errands, shopping? 0  Preparing Food and eating ? N  Using the Toilet? N  In the past six months, have you accidently leaked urine? N  Do you have problems with loss of bowel control? N  Managing your Medications? N  Managing your Finances? N  Housekeeping or managing your Housekeeping? N    Patient Care Team: Crecencio Mc, MD as PCP - General (Internal Medicine)  Indicate any recent Medical Services you may have received from other than [facility] providers in the past year (date may be approximate).     Assessment:   This is a routine wellness examination for [place].  I connected with  Marissa Rodriguez on 03/27/22 by a audio enabled telemedicine application and verified that I am speaking with the correct person using two identifiers.  Patient Location: Home  Provider Location: Office/Clinic  I discussed the limitations of evaluation and management by telemedicine. The patient expressed understanding and agreed to proceed.   Hearing/Vision screen Hearing Screening - Comments:: Patient is able to hear conversational tones without difficulty. No issues reported. Vision Screening - Comments:: Followed by [facility], Dr. Mali Brasington No retinopathy reported  Cataract extraction, bilateral   Dietary issues and exercise activities discussed: Current Exercise Habits: Home exercise routine, Intensity: Mild   Goals Addressed             This Visit's Progress    Increase physical activity       I would like to start walking again for exercise.         Depression Screen    03/27/2022    9:23 AM 11/06/2021    9:55 AM 04/26/2021    9:32 AM 03/14/2021    9:55 AM 01/11/2021   11:33 AM 03/11/2020   10:46 AM 07/17/2019    4:22 PM  PHQ 2/9 Scores  PHQ - 2 Score 0 0 0 0 0 0 0  PHQ- 9 Score       0    Fall Risk    03/27/2022    9:30 AM 11/06/2021    9:55 AM 04/26/2021    9:32 AM 03/14/2021    9:57 AM 01/11/2021   11:33 AM  Fall Risk   Falls in the past year? 0 0 0 0 1  Number falls in past yr: 0    1  Injury with Fall? 0   0 0  Risk for fall due to :  No Fall Risks No Fall Risks    Follow up _0     FALL RISK PREVENTION PERTAINING TO THE HOME: Home free of loose throw rugs in walkways, pet beds, electrical cords, etc? Yes  Adequate lighting in your home to reduce risk of falls? Yes   ASSISTIVE DEVICES UTILIZED TO PREVENT FALLS: Life alert? No  Use of a cane, walker or w/c? No  Grab bars in the bathroom? No  Shower chair or bench in shower? No  Elevated toilet seat or a handicapped toilet? No   TIMED UP AND GO: Was the test performed? No .   Cognitive Function:    03/05/2018    9:58 AM  MMSE - Mini Mental State Exam  Orientation to time 5  Orientation to Place 5  Registration 3  Recall 3  Language- repeat 1  Language- read & follow direction 1  Copy design 1        03/27/2022    9:32 AM 03/09/2019  9:18 AM  6CIT Screen  What Year? 0 points 0 points  What month? 0 points 0 points  What time? 0 points 0 points  Count back from 20 0 points 0 points  Months in reverse 0 points 0 points  Repeat phrase 0 points   Total Score 0 points     Immunizations Immunization History  Administered Date(s) Administered   Influenza, High Dose Seasonal PF 03/24/2015, 03/05/2018, 03/30/2019   Influenza,inj,Quad PF,6+ Mos 07/01/2013   Influenza-Unspecified 04/11/2021   Pneumococcal Conjugate-13  03/17/2014, 03/30/2019   Pneumococcal Polysaccharide-23 07/02/2011   Tdap 10/10/2012   Shingrix Completed?: No.    Education has been provided regarding the importance of this vaccine. Patient has been advised to call insurance company to determine out of pocket expense if they have not yet received this vaccine. Advised may also receive vaccine at local pharmacy or Health Dept. Verbalized acceptance and understanding.  Screening Tests Health Maintenance  Topic Date Due   Zoster Vaccines- Shingrix (1 of 2) 06/27/2022 (Originally 01/01/1991)   INFLUENZA VACCINE  09/23/2022 (Originally 01/23/2022)   HEMOGLOBIN A1C  05/06/2022   TETANUS/TDAP  10/11/2022   Diabetic kidney evaluation - GFR measurement  11/04/2022   FOOT EXAM  11/07/2022   OPHTHALMOLOGY EXAM  12/23/2022   Diabetic kidney evaluation - Urine ACR  03/02/2023   MAMMOGRAM  03/14/2023   COLONOSCOPY (Pts 45-29yr Insurance coverage will need to be confirmed)  03/18/2024   Pneumonia Vaccine 80 Years old  Completed   DEXA SCAN  Completed   HPV VACCINES  Aged Out   COVID-19 Vaccine  Discontinued   Health Maintenance There are no preventive care reminders to display for this patient.  Lung Cancer Screening: (Low Dose CT Chest recommended if Age 80-80years, 30 pack-year currently smoking OR have quit w/in 15years.) does not qualify.   Hepatitis C Screening: does not qualify.  Vision Screening: Recommended annual ophthalmology exams for early detection of glaucoma and other disorders of the eye.  Dental Screening: Recommended annual dental exams for proper oral hygiene  Community Resource Referral / Chronic Care Management: CRR required this visit?  No   CCM required this visit?  No      Plan:     I have personally reviewed and noted the following in the patient's chart:   Medical and social history Use of alcohol, tobacco or illicit drugs  Current medications and supplements including opioid prescriptions. Patient is not  currently taking opioid prescriptions. Functional ability and status Nutritional status Physical activity Advanced directives List of other physicians Hospitalizations, surgeries, and ER visits in previous 12 months Vitals Screenings to include cognitive, depression, and falls Referrals and appointments  In addition, I have reviewed and discussed with patient certain preventive protocols, quality metrics, and best practice recommendations. A written personalized care plan for preventive services as well as general preventive health recommendations were provided to patient.     OVarney Biles LPN   [phone]

## 2022-04-09 ENCOUNTER — Other Ambulatory Visit: Payer: Self-pay | Admitting: Internal Medicine

## 2022-04-19 ENCOUNTER — Other Ambulatory Visit: Payer: Self-pay | Admitting: Internal Medicine

## 2022-05-04 ENCOUNTER — Other Ambulatory Visit (INDEPENDENT_AMBULATORY_CARE_PROVIDER_SITE_OTHER): Payer: Medicare HMO

## 2022-05-04 DIAGNOSIS — N1832 Chronic kidney disease, stage 3b: Secondary | ICD-10-CM | POA: Diagnosis not present

## 2022-05-04 DIAGNOSIS — E1122 Type 2 diabetes mellitus with diabetic chronic kidney disease: Secondary | ICD-10-CM

## 2022-05-04 DIAGNOSIS — E034 Atrophy of thyroid (acquired): Secondary | ICD-10-CM

## 2022-05-04 DIAGNOSIS — I10 Essential (primary) hypertension: Secondary | ICD-10-CM | POA: Diagnosis not present

## 2022-05-04 DIAGNOSIS — E782 Mixed hyperlipidemia: Secondary | ICD-10-CM | POA: Diagnosis not present

## 2022-05-04 LAB — TSH: TSH: 1.4 u[IU]/mL (ref 0.35–5.50)

## 2022-05-04 LAB — LIPID PANEL
Cholesterol: 143 mg/dL (ref 0–200)
HDL: 39.5 mg/dL
LDL Cholesterol: 78 mg/dL (ref 0–99)
NonHDL: 103.95
Total CHOL/HDL Ratio: 4
Triglycerides: 131 mg/dL (ref 0.0–149.0)
VLDL: 26.2 mg/dL (ref 0.0–40.0)

## 2022-05-04 LAB — HEMOGLOBIN A1C: Hgb A1c MFr Bld: 6.3 % (ref 4.6–6.5)

## 2022-05-04 LAB — COMPREHENSIVE METABOLIC PANEL WITH GFR
ALT: 16 U/L (ref 0–35)
AST: 20 U/L (ref 0–37)
Albumin: 4.2 g/dL (ref 3.5–5.2)
Alkaline Phosphatase: 49 U/L (ref 39–117)
BUN: 28 mg/dL — ABNORMAL HIGH (ref 6–23)
CO2: 25 meq/L (ref 19–32)
Calcium: 9.2 mg/dL (ref 8.4–10.5)
Chloride: 105 meq/L (ref 96–112)
Creatinine, Ser: 1.43 mg/dL — ABNORMAL HIGH (ref 0.40–1.20)
GFR: 34.68 mL/min — ABNORMAL LOW
Glucose, Bld: 100 mg/dL — ABNORMAL HIGH (ref 70–99)
Potassium: 4.5 meq/L (ref 3.5–5.1)
Sodium: 137 meq/L (ref 135–145)
Total Bilirubin: 0.6 mg/dL (ref 0.2–1.2)
Total Protein: 7.3 g/dL (ref 6.0–8.3)

## 2022-05-07 ENCOUNTER — Telehealth: Payer: Self-pay

## 2022-05-07 NOTE — Progress Notes (Signed)
 [facility]) Care Management  [facility]   05/07/2022  Marissa Rodriguez December 05, 1940 [id number]   2024 Medication Assistance Renewal Application Summary:  Patient was outreached by [facility] Pharmacy Team regarding medication assistance renewal for 2024. Called patient and unable to reach; left a voicemail asking patient to return my call. Follow up in 5-7 business days.   Thank you for allowing pharmacy to be a part of this patients care.  Dorcas Solian, PharmD Clinical Pharmacist [facility] Cell: [phone]

## 2022-05-09 ENCOUNTER — Ambulatory Visit (INDEPENDENT_AMBULATORY_CARE_PROVIDER_SITE_OTHER): Payer: Medicare HMO | Admitting: Internal Medicine

## 2022-05-09 ENCOUNTER — Encounter: Payer: Self-pay | Admitting: Internal Medicine

## 2022-05-09 VITALS — BP 120/70 | HR 69 | Temp 98.1°F | Ht 65.0 in | Wt 192.4 lb

## 2022-05-09 DIAGNOSIS — N1832 Chronic kidney disease, stage 3b: Secondary | ICD-10-CM | POA: Diagnosis not present

## 2022-05-09 DIAGNOSIS — E782 Mixed hyperlipidemia: Secondary | ICD-10-CM | POA: Diagnosis not present

## 2022-05-09 DIAGNOSIS — E034 Atrophy of thyroid (acquired): Secondary | ICD-10-CM

## 2022-05-09 DIAGNOSIS — I10 Essential (primary) hypertension: Secondary | ICD-10-CM | POA: Diagnosis not present

## 2022-05-09 DIAGNOSIS — E1122 Type 2 diabetes mellitus with diabetic chronic kidney disease: Secondary | ICD-10-CM | POA: Diagnosis not present

## 2022-05-09 DIAGNOSIS — K760 Fatty (change of) liver, not elsewhere classified: Secondary | ICD-10-CM

## 2022-05-09 NOTE — Patient Instructions (Addendum)
You are doing very well!  Everything is well controlled so I will see you in 6 months     The ShingRx vaccine is now available in local pharmacies and is much more protective thant Zostavaxs,  It is therefore ADVISED for all interested adults over 50 to prevent shingles   wait until the holidays  to  get yours at your pharmacy

## 2022-05-09 NOTE — Progress Notes (Unsigned)
Subjective:  Patient ID: Marissa Rodriguez, female    DOB: [birth year]  Age: 81 y.o. MRN: [id number]  CC: The primary encounter diagnosis was Essential hypertension. Diagnoses of Hypothyroidism due to acquired atrophy of thyroid, Type 2 diabetes mellitus with stage 3b chronic kidney disease, without long-term current use of insulin ([place]), and Mixed hyperlipidemia were also pertinent to this visit.   HPI Marissa Rodriguez presents for  Chief Complaint  Patient presents with   Follow-up    6 month follow up on diabetes, hypertension, hypothyroidism    1) T2DM:  aided by [facility] in getting Farxiga and Ozempic 2 mg weekly.  Weight has plateaued.  Has one box left ( 4 doses)  and having no nausea.  No lows,  100 to 115   20 HTN:  Patient is taking her medications as prescribed and notes no adverse effects.  Home BP readings have been done about once per week and are  generally < 136/80 .  She is avoiding added salt in her diet and walking regularly about 3 times per week for exercise  .      Outpatient Medications Prior to Visit  Medication Sig Dispense Refill   amLODipine (NORVASC) 5 MG tablet Take 1 tablet by mouth once daily 90 tablet 1   atorvastatin (LIPITOR) 20 MG tablet Take 1 tablet by mouth once daily 90 tablet 0   blood glucose meter kit and supplies Dispense based on patient and insurance preference. Use to check blood sugars twice daily. (FOR ICD-10 E11.69). 1 each 0   FARXIGA 10 MG TABS tablet TAKE 1 TABLET BY MOUTH ONCE DAILY BEFORE BREAKFAST 30 tablet 0   glucose blood (ONETOUCH VERIO) test strip USE TO TEST BLOOD SUGAR UP TO TWICE DAILY 100 each 0   Lancets (ONETOUCH DELICA PLUS [id number]) MISC USE TO TEST BLOOD SUGAR TWICE DAILY     levothyroxine (SYNTHROID) 100 MCG tablet TAKE 1 TABLET BY MOUTH ONCE DAILY BEFORE BREAKFAST 90 tablet 0   metoprolol succinate (TOPROL-XL) 25 MG 24 hr tablet TAKE 1 TABLET BY MOUTH AT BEDTIME 90 tablet 0   Omega-3 Fatty Acids (FISH OIL) 1000 MG CAPS Take  1 capsule by mouth daily.     Semaglutide, 2 MG/DOSE, 8 MG/3ML SOPN Inject 2 mg as directed once a week. 3 mL 2   telmisartan (MICARDIS) 80 MG tablet Take 1 tablet (80 mg total) by mouth daily. 90 tablet 3   No facility-administered medications prior to visit.    Review of Systems;  Patient denies headache, fevers, malaise, unintentional weight loss, skin rash, eye pain, sinus congestion and sinus pain, sore throat, dysphagia,  hemoptysis , cough, dyspnea, wheezing, chest pain, palpitations, orthopnea, edema, abdominal pain, nausea, melena, diarrhea, constipation, flank pain, dysuria, hematuria, urinary  Frequency, nocturia, numbness, tingling, seizures,  Focal weakness, Loss of consciousness,  Tremor, insomnia, depression, anxiety, and suicidal ideation.      Objective:  BP 120/70 (BP Location: Left Arm, Patient Position: Sitting, Cuff Size: Large)   Pulse 69   Temp 98.1 F (36.7 C) (Oral)   Ht _0  (1.651 m)   Wt 192 lb 6.4 oz (87.3 kg)   SpO2 97%   BMI 32.02 kg/m   BP Readings from Last 3 Encounters:  05/09/22 120/70  11/06/21 114/64  04/26/21 124/66    Wt Readings from Last 3 Encounters:  05/09/22 192 lb 6.4 oz (87.3 kg)  03/27/22 192 lb (87.1 kg)  11/06/21 192 lb 12.8 oz (87.5 kg)  General appearance: alert, cooperative and appears stated age Ears: normal TM's and external ear canals both ears Throat: lips, mucosa, and tongue normal; teeth and gums normal Neck: no adenopathy, no carotid bruit, supple, symmetrical, trachea midline and thyroid not enlarged, symmetric, no tenderness/mass/nodules Back: symmetric, no curvature. ROM normal. No CVA tenderness. Lungs: clear to auscultation bilaterally Heart: regular rate and rhythm, S1, S2 normal, no murmur, click, rub or gallop Abdomen: soft, non-tender; bowel sounds normal; no masses,  no organomegaly Pulses: 2+ and symmetric Skin: Skin color, texture, turgor normal. No rashes or lesions Lymph nodes: Cervical,  supraclavicular, and axillary nodes normal. Neuro:  awake and interactive with normal mood and affect. Higher cortical functions are normal. Speech is clear without word-finding difficulty or dysarthria. Extraocular movements are intact. Visual fields of both eyes are grossly intact. Sensation to light touch is grossly intact bilaterally of upper and lower extremities. Motor examination shows 4+/5 symmetric hand grip and upper extremity and 5/5 lower extremity strength. There is no pronation or drift. Gait is non-ataxic   Lab Results  Component Value Date   HGBA1C 6.3 05/04/2022   HGBA1C 6.2 11/03/2021   HGBA1C 6.6 (H) 04/26/2021    Lab Results  Component Value Date   CREATININE 1.43 (H) 05/04/2022   CREATININE 1.30 (H) 11/03/2021   CREATININE 1.26 (H) 04/26/2021    Lab Results  Component Value Date   WBC 9.4 02/19/2019   HGB 12.7 02/19/2019   HCT 38.6 02/19/2019   PLT 262.0 02/19/2019   GLUCOSE 100 (H) 05/04/2022   CHOL 143 05/04/2022   TRIG 131.0 05/04/2022   HDL 39.50 05/04/2022   LDLDIRECT 113.0 09/22/2015   LDLCALC 78 05/04/2022   ALT 16 05/04/2022   AST 20 05/04/2022   NA 137 05/04/2022   K 4.5 05/04/2022   CL 105 05/04/2022   CREATININE 1.43 (H) 05/04/2022   BUN 28 (H) 05/04/2022   CO2 25 05/04/2022   TSH 1.40 05/04/2022   HGBA1C 6.3 05/04/2022   MICROALBUR 2.4 (H) 11/03/2021    MM 3D SCREEN BREAST BILATERAL  Result Date: 03/14/2022 CLINICAL DATA:  Screening. EXAM: DIGITAL SCREENING BILATERAL MAMMOGRAM WITH TOMOSYNTHESIS AND CAD TECHNIQUE: Bilateral screening digital craniocaudal and mediolateral oblique mammograms were obtained. Bilateral screening digital breast tomosynthesis was performed. The images were evaluated with computer-aided detection. COMPARISON:  Previous exam(s). ACR Breast Density Category b: There are scattered areas of fibroglandular density. FINDINGS: There are no findings suspicious for malignancy. IMPRESSION: No mammographic evidence of  malignancy. A result letter of this screening mammogram will be mailed directly to the patient. RECOMMENDATION: Screening mammogram in one year. (Code:SM-B-01Y) BI-RADS CATEGORY  1: Negative. Electronically Signed   By: Fidela Salisbury M.D.   On: 03/14/2022 10:53    Assessment & Plan:   Problem List Items Addressed This Visit     Essential hypertension - Primary   Hyperlipidemia   Hypothyroidism   Type 2 diabetes mellitus with stage 3 chronic kidney disease ([place])    I spent a total of   minutes with this patient in a face to face visit on the date of this encounter reviewing the last office visit with me in       ,  most recent visit with cardiology ,    ,  patient's diet and exercise habits, home blood pressure /blod sugar readings, recent ER visit including labs and imaging studies ,   and post visit ordering of testing and therapeutics.    Follow-up: No follow-ups on file.  Crecencio Mc, MD

## 2022-05-10 ENCOUNTER — Encounter: Payer: Self-pay | Admitting: Internal Medicine

## 2022-05-10 NOTE — Assessment & Plan Note (Signed)
Liver enzymes are normal.  Continue ozempic and statin   Lab Results  Component Value Date   ALT 16 05/04/2022   AST 20 05/04/2022   ALKPHOS 49 05/04/2022   BILITOT 0.6 05/04/2022

## 2022-05-10 NOTE — Assessment & Plan Note (Signed)
she reports compliance with medication regimen  .  She is not using NSAIDs daily. Reviewed  goal of 130/80 for patients over 70)  to preserve renal function.  She has been reminded to continue home checks of BP and submit readings for evaluation. Renal function, electrolytes and screen for proteinuria are all up to date, GFR is low but  stable   Lab Results  Component Value Date   CREATININE 1.43 (H) 05/04/2022   Lab Results  Component Value Date   NA 137 05/04/2022   K 4.5 05/04/2022   CL 105 05/04/2022   CO2 25 05/04/2022

## 2022-05-10 NOTE — Assessment & Plan Note (Signed)
Thyroid function is WNL on current levothyroxine  dose.  No current changes needed.   Lab Results  Component Value Date   TSH 1.40 05/04/2022

## 2022-05-10 NOTE — Assessment & Plan Note (Signed)
.  complicated by proteinuria and  and diabetic retinopathy.  Improved control with ozempic and [place]. .Ozempic dose is at maximal level at  2.0 mg weekly as a trial, and she is receiving both meds frm [facility],  no changes today.   Reviewed her visit with Dr Holley Raring her nephrologist; GFR is stable ; hyperkalemia has resolved.  Lab Results  Component Value Date   HGBA1C 6.3 05/04/2022   Lab Results  Component Value Date   NA 137 05/04/2022   K 4.5 05/04/2022   CL 105 05/04/2022   CO2 25 05/04/2022   Lab Results  Component Value Date   CREATININE 1.43 (H) 05/04/2022

## 2022-05-21 ENCOUNTER — Telehealth: Payer: Self-pay

## 2022-05-21 NOTE — Progress Notes (Signed)
[facility]) Care Management  [place]   05/21/2022  Marissa Rodriguez 1940/06/27 [id number]   2024 Medication Assistance Renewal Application Summary:  Patient was outreached by [place] Team regarding medication assistance renewal for 2024. Called patient, second attempt and unable to reach; left a voicemail asking patient to return my call. Follow up in 5-7 business days.   Thank you for allowing pharmacy to be a part of this patient's care.  [name], PharmD Clinical Pharmacist [place] Cell: [phone]

## 2022-05-22 ENCOUNTER — Other Ambulatory Visit: Payer: Self-pay | Admitting: Family

## 2022-05-22 DIAGNOSIS — E034 Atrophy of thyroid (acquired): Secondary | ICD-10-CM

## 2022-05-29 ENCOUNTER — Telehealth: Payer: Self-pay

## 2022-05-29 NOTE — Progress Notes (Signed)
[facility]) Care Management  [place]   05/29/2022  Angelisse W [place] 13-Aug-1940 [id number]   2024 Medication Assistance Renewal Application Summary:  Patient was outreached by [place] [facility] Team regarding medication assistance renewal for 2024. Verified address, anticipated insurance for 2024, and income has not changed. Patient remains interested in PAP for 2024 for Farxiga and Ozempic, no other new medications were identified for medication assistance.    Medication Assistance Findings:  Medication assistance needs identified: [place] and Ozempic     Additional medication assistance options reviewed with patient as warranted:  No other options identified  Plan: I will route patient assistance letter to [place] technician who will coordinate patient assistance program application process for medications listed above.  [facility] pharmacy technician will assist with obtaining all required documents from both patient and provider(s) and submit application(s) once completed.    Thank you for allowing pharmacy to be a part of this patient's care.   [name], PharmD Clinical Pharmacist [place] Cell: [phone]

## 2022-05-30 ENCOUNTER — Telehealth: Payer: Self-pay | Admitting: Pharmacy Technician

## 2022-05-30 DIAGNOSIS — Z596 Low income: Secondary | ICD-10-CM

## 2022-05-30 DIAGNOSIS — Z59868 Other specified financial insecurity: Secondary | ICD-10-CM

## 2022-05-30 NOTE — Progress Notes (Signed)
[name]                                            [place] Team    05/30/2022  Marissa Rodriguez [birth year] [id number]                                      Medication Assistance Referral  Referral From:  [facility] RPh  [name]  Medication/Company: [name] Patient application portion:  Mailed Provider application portion: Faxed  to Dr. Derrel Nip Provider address/fax verified via: Office website  Medication/Company: [name] Patient application portion:  Mailed Provider application portion: Faxed  to Dr. Derrel Nip Provider address/fax verified via: Office website    [name], [name]  [phone]

## 2022-05-31 ENCOUNTER — Telehealth: Payer: Self-pay

## 2022-05-31 DIAGNOSIS — E034 Atrophy of thyroid (acquired): Secondary | ICD-10-CM

## 2022-05-31 MED ORDER — LEVOTHYROXINE SODIUM 100 MCG PO TABS: 100.0000 ug | ORAL_TABLET | Freq: Every day | ORAL | 1 refills | Status: DC

## 2022-05-31 NOTE — Telephone Encounter (Signed)
Patient states she had reached out to her pharmacy for a refill for her levothyroxine (SYNTHROID) 100 MCG tablet and her pharmacy states they are waiting to hear back from [place].  Patient states she has about three pills left.  *Patient states her preferred pharmacy is [facility] on [address].

## 2022-05-31 NOTE — Telephone Encounter (Signed)
LM letting pt know that medication has been sent to pharmacy.

## 2022-06-05 NOTE — Telephone Encounter (Signed)
MyChart messgae sent to patient. 

## 2022-06-07 ENCOUNTER — Other Ambulatory Visit: Payer: Self-pay | Admitting: Internal Medicine

## 2022-06-12 ENCOUNTER — Telehealth: Payer: Self-pay | Admitting: Pharmacy Technician

## 2022-06-12 DIAGNOSIS — Z59868 Other specified financial insecurity: Secondary | ICD-10-CM

## 2022-06-12 DIAGNOSIS — Z596 Low income: Secondary | ICD-10-CM

## 2022-06-12 NOTE — Progress Notes (Signed)
[facility])                                            [place] Team    06/12/2022  Tonjua W [place] 08-04-1940 [id number]  Received both patient and provider portion(s) of patient assistance application(s) for [place] and [place]. Faxed completed application and required documents into [employer] and [employer] respectively.    Earlena Werst P. Kayvion Arneson, [place]  [phone]

## 2022-06-20 ENCOUNTER — Telehealth: Payer: Self-pay | Admitting: Internal Medicine

## 2022-06-20 NOTE — Telephone Encounter (Signed)
Patient would like the office to contact manufacture about her FARXIGA 10 MG TABS tablet. She would like to know when this will be sent out. Only 3 pills remaining.

## 2022-06-22 NOTE — Telephone Encounter (Signed)
Is it okay to give her samples to get her through till her patient assistance medication comes in?

## 2022-06-22 NOTE — Telephone Encounter (Signed)
Medication Samples have been provided to the patient.  Drug name: Wilder Glade       Strength: 10 mg        Qty: 4 boxes  LOT: [id number], [id number]  Exp.Date: 03/24/2024, 07/25/2024  Dosing instructions: Take 1 tablet by mouth daily.   The patient has been instructed regarding the correct time, dose, and frequency of taking this medication, including desired effects and most common side effects.   Marissa Rodriguez 12:01 PM 06/22/2022

## 2022-06-22 NOTE — Telephone Encounter (Signed)
Pt is calling about medication update

## 2022-06-22 NOTE — Telephone Encounter (Signed)
I am not sure when the Wilder Glade will be delivered. I suspect it may take time for the renewal application to be processed and delivered at the end of January or February.  I will loop our pharmacy technician to weigh-in on an estimated date. Are samples available?  Thanks!

## 2022-07-02 ENCOUNTER — Telehealth: Payer: Self-pay | Admitting: Internal Medicine

## 2022-07-02 NOTE — Telephone Encounter (Signed)
Have not received in the office yet

## 2022-07-02 NOTE — Telephone Encounter (Signed)
Prescription Request  07/02/2022  Is this a "Controlled Substance" medicine? No  LOV: 05/09/2022  What is the name of the medication or equipment? Ozempic  Have you contacted your pharmacy to request a refill? No   Which pharmacy would you like this sent to?   Per pt she pick them up here in the office.     Patient notified that their request is being sent to the clinical staff for review and that they should receive a response within 2 business days.   Please advise at Mobile [phone] (mobile)

## 2022-07-03 NOTE — Telephone Encounter (Signed)
Application was submitted by Marissa Rodriguez on 06/12/22. Please ask patient to call [employer] to follow up on approval and shipping if she has not heard back yet - [phone]. I'll send this information as a MyChart message.   [name]

## 2022-07-03 NOTE — Telephone Encounter (Signed)
Pt called stating that she has not received her Ozempic.

## 2022-07-05 ENCOUNTER — Other Ambulatory Visit: Payer: Self-pay | Admitting: Internal Medicine

## 2022-07-05 ENCOUNTER — Telehealth: Payer: Self-pay

## 2022-07-05 DIAGNOSIS — I10 Essential (primary) hypertension: Secondary | ICD-10-CM | POA: Diagnosis not present

## 2022-07-05 DIAGNOSIS — R809 Proteinuria, unspecified: Secondary | ICD-10-CM | POA: Diagnosis not present

## 2022-07-05 DIAGNOSIS — N1832 Chronic kidney disease, stage 3b: Secondary | ICD-10-CM | POA: Diagnosis not present

## 2022-07-05 DIAGNOSIS — N1831 Chronic kidney disease, stage 3a: Secondary | ICD-10-CM | POA: Diagnosis not present

## 2022-07-05 DIAGNOSIS — E1122 Type 2 diabetes mellitus with diabetic chronic kidney disease: Secondary | ICD-10-CM | POA: Diagnosis not present

## 2022-07-05 NOTE — Telephone Encounter (Signed)
Medication Samples have been provided to the patient.  Drug name: Ozempic       Strength: 1 mg        Qty: 2 boxes  LOT: [id number]  Exp.Date: 05/24/2024  Dosing instructions: Inject 2 mg into skin once weekly.   The patient has been instructed regarding the correct time, dose, and frequency of taking this medication, including desired effects and most common side effects.   Marissa Rodriguez 2:27 PM 07/05/2022

## 2022-07-10 ENCOUNTER — Telehealth: Payer: Self-pay | Admitting: Pharmacy Technician

## 2022-07-10 DIAGNOSIS — Z596 Low income: Secondary | ICD-10-CM

## 2022-07-10 DIAGNOSIS — Z59868 Other specified financial insecurity: Secondary | ICD-10-CM

## 2022-07-10 NOTE — Progress Notes (Signed)
[facility])                                            [place] Team    07/10/2022  Marissa Rodriguez 1940/12/20 [id number]  Care coordination call placed to [employer] in regard to [facility] application.  Spoke to Jenny Reichmann who informs patient is APPROVED 06/25/22-06/25/23. Medication will auto fill and ship to patient's home based on last time it was filled in 2023.  Deane Wattenbarger P. Quasim Doyon, [place]  [phone]

## 2022-07-12 ENCOUNTER — Telehealth: Payer: Self-pay | Admitting: Pharmacy Technician

## 2022-07-12 DIAGNOSIS — Z59868 Other specified financial insecurity: Secondary | ICD-10-CM

## 2022-07-12 DIAGNOSIS — Z596 Low income: Secondary | ICD-10-CM

## 2022-07-12 NOTE — Progress Notes (Addendum)
[facility])                                            [place] Team    07/12/2022  Kissy W [place] 1940/10/24 [id number]  Care coordination call placed to [place] in regard to [place] application.  Spoke to Opal Sidles who informs patient is APPROVED 07/11/22-06/25/23. Medication will auto fill and ship to provider's based on last time it was filled in 2023.   Vici Novick P. Kristiane Morsch, [place]  [phone]

## 2022-07-13 ENCOUNTER — Other Ambulatory Visit: Payer: Self-pay | Admitting: Internal Medicine

## 2022-07-15 ENCOUNTER — Other Ambulatory Visit: Payer: Self-pay | Admitting: Internal Medicine

## 2022-07-19 ENCOUNTER — Telehealth: Payer: Self-pay

## 2022-07-23 ENCOUNTER — Telehealth: Payer: Self-pay

## 2022-07-23 NOTE — Telephone Encounter (Signed)
Mychart message sent to pick up pt assistance medication.

## 2022-07-23 NOTE — Telephone Encounter (Signed)
Pt came and picked up pT assistance medication Ozempic (4 boxes) @ 10:50 am on 07/23/22

## 2022-10-16 ENCOUNTER — Other Ambulatory Visit: Payer: Self-pay | Admitting: Internal Medicine

## 2022-10-20 ENCOUNTER — Other Ambulatory Visit: Payer: Self-pay | Admitting: Internal Medicine

## 2022-10-23 ENCOUNTER — Telehealth: Payer: Self-pay

## 2022-10-23 NOTE — Telephone Encounter (Signed)
Spoke with pt and informed her that we have received her patient assistance medication and it is ready. Pt stated that she will pick up tomorrow.    Ozempic: 4 boxes

## 2022-10-24 DIAGNOSIS — E1122 Type 2 diabetes mellitus with diabetic chronic kidney disease: Secondary | ICD-10-CM | POA: Diagnosis not present

## 2022-10-24 DIAGNOSIS — I10 Essential (primary) hypertension: Secondary | ICD-10-CM | POA: Diagnosis not present

## 2022-10-24 DIAGNOSIS — E875 Hyperkalemia: Secondary | ICD-10-CM | POA: Diagnosis not present

## 2022-10-24 DIAGNOSIS — R809 Proteinuria, unspecified: Secondary | ICD-10-CM | POA: Diagnosis not present

## 2022-10-24 DIAGNOSIS — N1832 Chronic kidney disease, stage 3b: Secondary | ICD-10-CM | POA: Diagnosis not present

## 2022-11-09 ENCOUNTER — Encounter: Payer: Self-pay | Admitting: Internal Medicine

## 2022-11-09 ENCOUNTER — Ambulatory Visit (INDEPENDENT_AMBULATORY_CARE_PROVIDER_SITE_OTHER): Payer: Medicare HMO | Admitting: Internal Medicine

## 2022-11-09 VITALS — BP 120/72 | HR 69 | Temp 97.7°F | Ht 65.0 in | Wt 194.4 lb

## 2022-11-09 DIAGNOSIS — K7581 Nonalcoholic steatohepatitis (NASH): Secondary | ICD-10-CM

## 2022-11-09 DIAGNOSIS — E034 Atrophy of thyroid (acquired): Secondary | ICD-10-CM | POA: Diagnosis not present

## 2022-11-09 DIAGNOSIS — I10 Essential (primary) hypertension: Secondary | ICD-10-CM

## 2022-11-09 DIAGNOSIS — E1122 Type 2 diabetes mellitus with diabetic chronic kidney disease: Secondary | ICD-10-CM | POA: Diagnosis not present

## 2022-11-09 DIAGNOSIS — N1832 Chronic kidney disease, stage 3b: Secondary | ICD-10-CM | POA: Diagnosis not present

## 2022-11-09 DIAGNOSIS — I8510 Secondary esophageal varices without bleeding: Secondary | ICD-10-CM | POA: Diagnosis not present

## 2022-11-09 DIAGNOSIS — Z7985 Long-term (current) use of injectable non-insulin antidiabetic drugs: Secondary | ICD-10-CM | POA: Diagnosis not present

## 2022-11-09 DIAGNOSIS — E782 Mixed hyperlipidemia: Secondary | ICD-10-CM | POA: Diagnosis not present

## 2022-11-09 DIAGNOSIS — K7469 Other cirrhosis of liver: Secondary | ICD-10-CM

## 2022-11-09 HISTORY — DX: Nonalcoholic steatohepatitis (NASH): K75.81

## 2022-11-09 HISTORY — DX: Other cirrhosis of liver: K74.69

## 2022-11-09 LAB — LIPID PANEL
Cholesterol: 147 mg/dL (ref 0–200)
HDL: 40.5 mg/dL
LDL Cholesterol: 89 mg/dL (ref 0–99)
NonHDL: 106.93
Total CHOL/HDL Ratio: 4
Triglycerides: 92 mg/dL (ref 0.0–149.0)
VLDL: 18.4 mg/dL (ref 0.0–40.0)

## 2022-11-09 LAB — COMPREHENSIVE METABOLIC PANEL WITH GFR
ALT: 22 U/L (ref 0–35)
AST: 26 U/L (ref 0–37)
Albumin: 4.2 g/dL (ref 3.5–5.2)
Alkaline Phosphatase: 56 U/L (ref 39–117)
BUN: 21 mg/dL (ref 6–23)
CO2: 25 meq/L (ref 19–32)
Calcium: 9.6 mg/dL (ref 8.4–10.5)
Chloride: 104 meq/L (ref 96–112)
Creatinine, Ser: 1.43 mg/dL — ABNORMAL HIGH (ref 0.40–1.20)
GFR: 34.56 mL/min — ABNORMAL LOW
Glucose, Bld: 112 mg/dL — ABNORMAL HIGH (ref 70–99)
Potassium: 4.4 meq/L (ref 3.5–5.1)
Sodium: 139 meq/L (ref 135–145)
Total Bilirubin: 0.4 mg/dL (ref 0.2–1.2)
Total Protein: 7.9 g/dL (ref 6.0–8.3)

## 2022-11-09 LAB — MICROALBUMIN / CREATININE URINE RATIO
Creatinine,U: 142 mg/dL
Microalb Creat Ratio: 2.9 mg/g (ref 0.0–30.0)
Microalb, Ur: 4.2 mg/dL — ABNORMAL HIGH (ref 0.0–1.9)

## 2022-11-09 LAB — LDL CHOLESTEROL, DIRECT: Direct LDL: 92 mg/dL

## 2022-11-09 LAB — TSH: TSH: 1.72 u[IU]/mL (ref 0.35–5.50)

## 2022-11-09 LAB — HEMOGLOBIN A1C: Hgb A1c MFr Bld: 6.1 % (ref 4.6–6.5)

## 2022-11-09 MED ORDER — METOPROLOL SUCCINATE ER 25 MG PO TB24: 25.0000 mg | ORAL_TABLET | Freq: Every day | ORAL | 1 refills | Status: DC

## 2022-11-09 MED ORDER — LEVOTHYROXINE SODIUM 100 MCG PO TABS: 100.0000 ug | ORAL_TABLET | Freq: Every day | ORAL | 1 refills | Status: DC

## 2022-11-09 MED ORDER — TELMISARTAN 80 MG PO TABS: 80.0000 mg | ORAL_TABLET | Freq: Every day | ORAL | 1 refills | Status: DC

## 2022-11-09 NOTE — Patient Instructions (Addendum)
Continue 2 mg Ozempic  weekly   I am checking to see if we can switch you to [facility] down the road

## 2022-11-09 NOTE — Progress Notes (Unsigned)
Subjective:  Patient ID: Marissa Rodriguez, female    DOB: September 20, 1940  Age: 82 y.o. MRN: [id number]  CC: The primary encounter diagnosis was Long-term current use of injectable noninsulin antidiabetic medication. Diagnoses of Hypothyroidism due to acquired atrophy of thyroid, Essential hypertension, Type 2 diabetes mellitus with stage 3b chronic kidney disease, without long-term current use of insulin (HCC), Mixed hyperlipidemia, and Secondary esophageal varices without bleeding (HCC) were also pertinent to this visit.   HPI Marissa Rodriguez presents for  Chief Complaint  Patient presents with   Medical Management of Chronic Issues   1)  T2DM/obesity/CKD:  she has been  taking 2 ,mg ozempic  and weight has not changed since November. She  feels generally well,  But is not  exercising regularly or trying to lose weight. Checking  blood sugars less daily at variable times, usually only if she feels she may be having a hypoglycemic event. .  BS have been under 130 fasting.  Denies any recent hypoglyemic events.  Taking   medications as directed. Following a carbohydrate modified diet 6 days per week. Denies numbness, burning and tingling of extremities. Appetite is good.    Starting weight 207,  home scales 191.   2) HTN:  she is currently prescribed and taking 3 drugs: amlodipine, telmisartan metoprolol xl .  Home readings 120/72    Outpatient Medications Prior to Visit  Medication Sig Dispense Refill   amLODipine (NORVASC) 5 MG tablet Take 1 tablet by mouth once daily 90 tablet 1   atorvastatin (LIPITOR) 20 MG tablet Take 1 tablet by mouth once daily 90 tablet 3   blood glucose meter kit and supplies Dispense based on patient and insurance preference. Use to check blood sugars twice daily. (FOR ICD-10 E11.69). 1 each 0   cholecalciferol (VITAMIN D3) 25 MCG (1000 UNIT) tablet Take 1,000 Units by mouth daily.     FARXIGA 10 MG TABS tablet TAKE 1 TABLET BY MOUTH ONCE DAILY BEFORE BREAKFAST 30 tablet 0    glucose blood (ONETOUCH VERIO) test strip USE TO TEST BLOOD SUGAR UP TO TWICE DAILY 100 each 0   Lancets (ONETOUCH DELICA PLUS LANCET33G) MISC USE TO TEST BLOOD SUGAR TWICE DAILY     Omega-3 Fatty Acids (FISH OIL) 1000 MG CAPS Take 1 capsule by mouth daily.     Semaglutide, 2 MG/DOSE, 8 MG/3ML SOPN Inject 2 mg as directed once a week. 3 mL 2   levothyroxine (SYNTHROID) 100 MCG tablet Take 1 tablet (100 mcg total) by mouth daily before breakfast. 90 tablet 1   metoprolol succinate (TOPROL-XL) 25 MG 24 hr tablet TAKE 1 TABLET BY MOUTH AT BEDTIME 90 tablet 0   telmisartan (MICARDIS) 80 MG tablet Take 1 tablet by mouth once daily 90 tablet 1   No facility-administered medications prior to visit.    Review of Systems;  Patient denies headache, fevers, malaise, unintentional weight loss, skin rash, eye pain, sinus congestion and sinus pain, sore throat, dysphagia,  hemoptysis , cough, dyspnea, wheezing, chest pain, palpitations, orthopnea, edema, abdominal pain, nausea, melena, diarrhea, constipation, flank pain, dysuria, hematuria, urinary  Frequency, nocturia, numbness, tingling, seizures,  Focal weakness, Loss of consciousness,  Tremor, insomnia, depression, anxiety, and suicidal ideation.      Objective:  BP 120/72   Pulse 69   Temp 97.7 F (36.5 C) (Oral)   Ht 5\' 5"  (1.651 m)   Wt 194 lb 6.4 oz (88.2 kg)   SpO2 95%   BMI 32.35 kg/m  BP Readings from Last 3 Encounters:  11/09/22 120/72  05/09/22 120/70  11/06/21 114/64    Wt Readings from Last 3 Encounters:  11/09/22 194 lb 6.4 oz (88.2 kg)  05/09/22 192 lb 6.4 oz (87.3 kg)  03/27/22 192 lb (87.1 kg)    Physical Exam Vitals reviewed.  Constitutional:      General: She is not in acute distress.    Appearance: Normal appearance. She is normal weight. She is not ill-appearing, toxic-appearing or diaphoretic.  HENT:     Head: Normocephalic.  Eyes:     General: No scleral icterus.       Right eye: No discharge.         Left eye: No discharge.     Conjunctiva/sclera: Conjunctivae normal.  Cardiovascular:     Rate and Rhythm: Normal rate and regular rhythm.     Heart sounds: Normal heart sounds.  Pulmonary:     Effort: Pulmonary effort is normal. No respiratory distress.     Breath sounds: Normal breath sounds.  Musculoskeletal:        General: Normal range of motion.  Skin:    General: Skin is warm and dry.  Neurological:     General: No focal deficit present.     Mental Status: She is alert and oriented to person, place, and time. Mental status is at baseline.  Psychiatric:        Mood and Affect: Mood normal.        Behavior: Behavior normal.        Thought Content: Thought content normal.        Judgment: Judgment normal.    Lab Results  Component Value Date   HGBA1C 6.1 11/09/2022   HGBA1C 6.3 05/04/2022   HGBA1C 6.2 11/03/2021    Lab Results  Component Value Date   CREATININE 1.43 (H) 11/09/2022   CREATININE 1.43 (H) 05/04/2022   CREATININE 1.30 (H) 11/03/2021    Lab Results  Component Value Date   WBC 9.4 02/19/2019   HGB 12.7 02/19/2019   HCT 38.6 02/19/2019   PLT 262.0 02/19/2019   GLUCOSE 112 (H) 11/09/2022   CHOL 147 11/09/2022   TRIG 92.0 11/09/2022   HDL 40.50 11/09/2022   LDLDIRECT 92.0 11/09/2022   LDLCALC 89 11/09/2022   ALT 22 11/09/2022   AST 26 11/09/2022   NA 139 11/09/2022   K 4.4 11/09/2022   CL 104 11/09/2022   CREATININE 1.43 (H) 11/09/2022   BUN 21 11/09/2022   CO2 25 11/09/2022   TSH 1.72 11/09/2022   HGBA1C 6.1 11/09/2022   MICROALBUR 4.2 (H) 11/09/2022    MM 3D SCREEN BREAST BILATERAL  Result Date: 03/14/2022 CLINICAL DATA:  Screening. EXAM: DIGITAL SCREENING BILATERAL MAMMOGRAM WITH TOMOSYNTHESIS AND CAD TECHNIQUE: Bilateral screening digital craniocaudal and mediolateral oblique mammograms were obtained. Bilateral screening digital breast tomosynthesis was performed. The images were evaluated with computer-aided detection. COMPARISON:   Previous exam(s). ACR Breast Density Category b: There are scattered areas of fibroglandular density. FINDINGS: There are no findings suspicious for malignancy. IMPRESSION: No mammographic evidence of malignancy. A result letter of this screening mammogram will be mailed directly to the patient. RECOMMENDATION: Screening mammogram in one year. (Code:[id number]-B-01Y) BI-RADS CATEGORY  1: Negative. Electronically Signed   By: Ted Mcalpine M.D.   On: 03/14/2022 10:53    Assessment & Plan:  .Long-term current use of injectable noninsulin antidiabetic medication Assessment & Plan: She is on maximal dose of ozempic with no weight change in 6  months and would like to try [facility] if pharmacy assstance is possible  Orders: -     Hemoglobin A1c -     Comprehensive metabolic panel -     AMB Referral to Pharmacy Medication Management  Hypothyroidism due to acquired atrophy of thyroid -     Levothyroxine Sodium; Take 1 tablet (100 mcg total) by mouth daily before breakfast.  Dispense: 90 tablet; Refill: 1 -     TSH  Essential hypertension Assessment & Plan: she reports compliance with 3 drug regimen.  She is not using NSAIDs daily. Reviewed  goal of 130/80 for patients over 70)  to preserve renal function.  She has been reminded to continue home checks of BP and submit readings for evaluation. Renal function, electrolytes and screen for proteinuria are all up to date, GFR is low but  stable   Lab Results  Component Value Date   CREATININE 1.43 (H) 11/09/2022   Lab Results  Component Value Date   NA 139 11/09/2022   K 4.4 11/09/2022   CL 104 11/09/2022   CO2 25 11/09/2022     Orders: -     Microalbumin / creatinine urine ratio -     Comprehensive metabolic panel  Type 2 diabetes mellitus with stage 3b chronic kidney disease, without long-term current use of insulin (HCC) Assessment & Plan: .complicated by proteinuria and  and diabetic retinopathy.  Improved control with ozempic and  [place]. .Ozempic dose is at maximal level at  2.0 mg weekly as a trial, and she is receiving both meds frm [facility] pharmacy  will attempt to get her Mounjaro .   Continue follow up with Lateef her nephrologist; GFR is stable ; hyperkalemia has resolved.  Lab Results  Component Value Date   HGBA1C 6.1 11/09/2022   Lab Results  Component Value Date   NA 139 11/09/2022   K 4.4 11/09/2022   CL 104 11/09/2022   CO2 25 11/09/2022   Lab Results  Component Value Date   CREATININE 1.43 (H) 11/09/2022       Orders: -     Microalbumin / creatinine urine ratio -     Hemoglobin A1c -     AMB Referral to Pharmacy Medication Management  Mixed hyperlipidemia -     LDL cholesterol, direct -     Lipid panel  Secondary esophageal varices without bleeding (HCC) Assessment & Plan: Continue metoprolol for management    Other orders -     Metoprolol Succinate ER; Take 1 tablet (25 mg total) by mouth at bedtime.  Dispense: 90 tablet; Refill: 1 -     Telmisartan; Take 1 tablet (80 mg total) by mouth daily.  Dispense: 90 tablet; Refill: 1    Follow-up: Return in about 6 months (around 05/12/2023).   Sherlene Shams, MD

## 2022-11-11 NOTE — Assessment & Plan Note (Signed)
she reports compliance with 3 drug regimen.  She is not using NSAIDs daily. Reviewed  goal of 130/80 for patients over 70)  to preserve renal function.  She has been reminded to continue home checks of BP and submit readings for evaluation. Renal function, electrolytes and screen for proteinuria are all up to date, GFR is low but  stable   Lab Results  Component Value Date   CREATININE 1.43 (H) 11/09/2022   Lab Results  Component Value Date   NA 139 11/09/2022   K 4.4 11/09/2022   CL 104 11/09/2022   CO2 25 11/09/2022

## 2022-11-11 NOTE — Assessment & Plan Note (Signed)
.  complicated by proteinuria and  and diabetic retinopathy.  Improved control with ozempic and [place]. .Ozempic dose is at maximal level at  2.0 mg weekly as a trial, and she is receiving both meds frm [facility] pharmacy  will attempt to get her Mounjaro .   Continue follow up with Lateef her nephrologist; GFR is stable ; hyperkalemia has resolved.  Lab Results  Component Value Date   HGBA1C 6.1 11/09/2022   Lab Results  Component Value Date   NA 139 11/09/2022   K 4.4 11/09/2022   CL 104 11/09/2022   CO2 25 11/09/2022   Lab Results  Component Value Date   CREATININE 1.43 (H) 11/09/2022

## 2022-11-11 NOTE — Assessment & Plan Note (Signed)
She is on maximal dose of ozempic with no weight change in 6 months and would like to try [facility] if pharmacy assstance is possible

## 2022-11-11 NOTE — Assessment & Plan Note (Signed)
Continue metoprolol for management

## 2022-11-12 ENCOUNTER — Telehealth: Payer: Self-pay

## 2022-11-12 NOTE — Progress Notes (Signed)
   Care Guide Note  11/12/2022 Name: Marissa Rodriguez MRN: [id number] DOB: 27-Aug-1940  Referred by: Sherlene Shams, MD Reason for referral : Care Coordination (Outreach to schedule with Pharm d )   Marissa Rodriguez is a 82 y.o. year old female who is a primary care patient of Sherlene Shams, MD. Marissa Rodriguez was referred to the pharmacist for assistance related to DM.    Successful contact was made with the patient to discuss pharmacy services including being ready for the pharmacist to call at least 5 minutes before the scheduled appointment time, to have medication bottles and any blood sugar or blood pressure readings ready for review. The patient agreed to meet with the pharmacist via with the pharmacist via telephone visit on (date/time).  12/05/2022  [name], RMA Care Guide [facility]  [place], [place] [address] Direct Dial: [phone] [name]

## 2022-12-05 ENCOUNTER — Other Ambulatory Visit: Payer: Medicare HMO | Admitting: Pharmacist

## 2022-12-05 DIAGNOSIS — E782 Mixed hyperlipidemia: Secondary | ICD-10-CM

## 2022-12-05 MED ORDER — ATORVASTATIN CALCIUM 40 MG PO TABS: 40.0000 mg | ORAL_TABLET | Freq: Every day | ORAL | 1 refills | Status: DC

## 2022-12-05 NOTE — Assessment & Plan Note (Signed)
LDL above goal <70. Increase atorvastatin to 40 mg daily. Fasting lipid panel in 6 weeks.

## 2022-12-05 NOTE — Patient Instructions (Signed)
Shawnda,   It was great talking to you today!  Increase atorvastatin to 40 mg daily. You can take two of the 20 mg tablets you currently have first, then pick up the 40 mg tablet strength. Our goal LDL given your medical history is less than 70.   Take care!  [name] Eppie Gibson, PharmD, BCACP, CPP [facility] [phone]

## 2022-12-05 NOTE — Progress Notes (Signed)
12/05/2022 Name: Marissa Rodriguez MRN: [id number] DOB: 1940/12/11  Chief Complaint  Patient presents with   Medication Management   Diabetes   Hypertension   Hyperlipidemia    Marissa Rodriguez is a 82 y.o. year old female who presented for a telephone visit.   They were referred to the pharmacist by their PCP for assistance in managing diabetes and hyperlipidemia.   Subjective:  Care Team: Primary Care Provider: Sherlene Shams, MD ; Next Scheduled Visit: 04/2023  Medication Access/Adherence  Current Pharmacy:  [facility] [address] [place] - [address] [place] [address] Phone: [phone] Fax: [phone]  PRIMEMAIL [facility] ORDER) ELECTRONIC - [place], [place] - [address] [place] [address] Phone: [phone] Fax: [phone]   Patient reports affordability concerns with their medications: No  Patient reports access/transportation concerns to their pharmacy: No  Patient reports adherence concerns with their medications:  No     Diabetes and Fatty Liver Disease  Current medications: Ozempic 2 mg weekly, Farxiga 10 mg daily  Reports weight has plateaued, is interested in exploring access for [facility].   Current medication access support: Receiving both Ozempic and Farxiga through [facility]   Hypertension and CKD:  Current medications: amlodipine 5 mg daily, telmisartan 80 mg daily, metoprolol succinate 25 mg daily  Farxiga for CKD  Hyperlipidemia/ASCVD Risk Reduction  Current lipid lowering medications: atorvastatin 20 mg daily  Objective:  Lab Results  Component Value Date   HGBA1C 6.1 11/09/2022    Lab Results  Component Value Date   CREATININE 1.43 (H) 11/09/2022   BUN 21 11/09/2022   NA 139 11/09/2022   K 4.4 11/09/2022   CL 104 11/09/2022   CO2 25 11/09/2022    Lab Results  Component Value Date   CHOL 147 11/09/2022   HDL 40.50 11/09/2022   LDLCALC 89 11/09/2022    LDLDIRECT 92.0 11/09/2022   TRIG 92.0 11/09/2022   CHOLHDL 4 11/09/2022    Medications Reviewed Today     Reviewed by Sandy Salaam, CMA (Certified Medical Assistant) on 11/09/22 at [phone]  Med List Status: <None>   Medication Order Taking? Sig Documenting Provider Last Dose Status Informant  amLODipine (NORVASC) 5 MG tablet [id number] Yes Take 1 tablet by mouth once daily Sherlene Shams, MD Taking Active   atorvastatin (LIPITOR) 20 MG tablet [id number] Yes Take 1 tablet by mouth once daily Sherlene Shams, MD Taking Active   blood glucose meter kit and supplies [id number] Yes Dispense based on patient and insurance preference. Use to check blood sugars twice daily. (FOR ICD-10 E11.69). Sherlene Shams, MD Taking Active   cholecalciferol (VITAMIN D3) 25 MCG (1000 UNIT) tablet [id number] Yes Take 1,000 Units by mouth daily. [provider] Taking Active   FARXIGA 10 MG TABS tablet [id number] Yes TAKE 1 TABLET BY MOUTH ONCE DAILY BEFORE BREAKFAST Sherlene Shams, MD Taking Active   glucose blood [facility] VERIO) test strip [id number] Yes USE TO TEST BLOOD SUGAR UP TO TWICE DAILY Sherlene Shams, MD Taking Active   Lancets [facility] DELICA PLUS [place]) [place] [id number] Yes USE TO TEST BLOOD SUGAR TWICE DAILY [provider] Taking Active   levothyroxine (SYNTHROID) 100 MCG tablet [id number] Yes Take 1 tablet (100 mcg total) by mouth daily before breakfast. Sherlene Shams, MD Taking Active   metoprolol succinate (TOPROL-XL) 25 MG 24 hr tablet [id number] Yes TAKE 1 TABLET BY MOUTH AT BEDTIME Sherlene Shams, MD  Taking Active   Omega-3 Fatty Acids (FISH OIL) 1000 MG CAPS [id number] Yes Take 1 capsule by mouth daily. [provider] Taking Active   Semaglutide, 2 MG/DOSE, 8 MG/3ML SOPN [id number] Yes Inject 2 mg as directed once a week. Sherlene Shams, MD Taking Active   telmisartan (MICARDIS) 80 MG tablet [id number] Yes Take 1 tablet by mouth once daily Sherlene Shams, MD Taking  Active               Assessment/Plan:   Diabetes and Fatty Liver Disease:  - Currently controlled but with opportunity for optimization - Discussed that [facility] does not currently have a patient assistance program. She is over income for Medicare Extra Help. If insurance does cover [facility], she would have to pay full $45 copay per month and will likely fall into Medicare Coverage Gap. She elects to continue Ozempic 2 mg weekly from patient assistance at this time. Will follow for expansion of [employer] assistance program to [place] in the future.    Hypertension: - Currently controlled - Recommend to continue current regimen   Hyperlipidemia/ASCVD Risk Reduction: - Currently uncontrolled, recommend goal LDL <70 given HTN, CKD.  - Recommend to increase atorvastatin to 40 mg daily. Patient amenable. Order sent. Fasting lipids scheduled in ~ 6 weeks.    Follow Up Plan: follow up labs in 6 weeks  [name] Eppie Gibson, PharmD, BCACP, CPP [facility] [phone]

## 2022-12-24 DIAGNOSIS — Z961 Presence of intraocular lens: Secondary | ICD-10-CM | POA: Diagnosis not present

## 2022-12-24 DIAGNOSIS — E113551 Type 2 diabetes mellitus with stable proliferative diabetic retinopathy, right eye: Secondary | ICD-10-CM | POA: Diagnosis not present

## 2022-12-24 LAB — HM DIABETES EYE EXAM

## 2022-12-30 ENCOUNTER — Other Ambulatory Visit: Payer: Self-pay | Admitting: Internal Medicine

## 2023-01-16 ENCOUNTER — Other Ambulatory Visit: Payer: Medicare HMO

## 2023-01-16 DIAGNOSIS — E782 Mixed hyperlipidemia: Secondary | ICD-10-CM

## 2023-01-16 LAB — LIPID PANEL
Cholesterol: 137 mg/dL (ref 0–200)
HDL: 40.4 mg/dL
LDL Cholesterol: 69 mg/dL (ref 0–99)
NonHDL: 96.32
Total CHOL/HDL Ratio: 3
Triglycerides: 136 mg/dL (ref 0.0–149.0)
VLDL: 27.2 mg/dL (ref 0.0–40.0)

## 2023-01-16 LAB — LDL CHOLESTEROL, DIRECT: Direct LDL: 75 mg/dL

## 2023-02-05 ENCOUNTER — Telehealth: Payer: Self-pay | Admitting: Internal Medicine

## 2023-02-05 NOTE — Telephone Encounter (Signed)
Called Notified Ozempic ready fro pick up in refrigerator labeled.

## 2023-02-06 ENCOUNTER — Telehealth: Payer: Self-pay

## 2023-02-06 NOTE — Telephone Encounter (Signed)
Pt came into office to pick up pt assistance medication Ozempic 4 boxes @ 3:30pm

## 2023-02-27 DIAGNOSIS — E1122 Type 2 diabetes mellitus with diabetic chronic kidney disease: Secondary | ICD-10-CM | POA: Diagnosis not present

## 2023-02-27 DIAGNOSIS — I10 Essential (primary) hypertension: Secondary | ICD-10-CM | POA: Diagnosis not present

## 2023-02-27 DIAGNOSIS — N2581 Secondary hyperparathyroidism of renal origin: Secondary | ICD-10-CM | POA: Diagnosis not present

## 2023-02-27 DIAGNOSIS — N1832 Chronic kidney disease, stage 3b: Secondary | ICD-10-CM | POA: Diagnosis not present

## 2023-02-27 LAB — MICROALBUMIN / CREATININE URINE RATIO: Microalb Creat Ratio: 17

## 2023-02-27 LAB — MICROALBUMIN, URINE: Microalb, Ur: 1.9

## 2023-02-27 LAB — PROTEIN / CREATININE RATIO, URINE: Creatinine, Urine: 113

## 2023-03-15 ENCOUNTER — Other Ambulatory Visit: Payer: Self-pay | Admitting: Internal Medicine

## 2023-03-15 DIAGNOSIS — Z1231 Encounter for screening mammogram for malignant neoplasm of breast: Secondary | ICD-10-CM

## 2023-03-20 ENCOUNTER — Ambulatory Visit
Admission: RE | Admit: 2023-03-20 | Discharge: 2023-03-20 | Disposition: A | Discharge status: Home / Self Care | Payer: Medicare HMO | Source: Ambulatory Visit | Attending: Internal Medicine | Admitting: Internal Medicine

## 2023-03-20 DIAGNOSIS — Z1231 Encounter for screening mammogram for malignant neoplasm of breast: Secondary | ICD-10-CM | POA: Diagnosis present

## 2023-04-01 ENCOUNTER — Ambulatory Visit: Payer: Medicare HMO | Admitting: *Deleted

## 2023-04-01 VITALS — BP 126/67 | HR 68 | Ht 65.0 in | Wt 190.0 lb

## 2023-04-01 DIAGNOSIS — Z0000 Encounter for general adult medical examination without abnormal findings: Secondary | ICD-10-CM | POA: Diagnosis not present

## 2023-04-01 NOTE — Patient Instructions (Signed)
Marissa Rodriguez , Thank you for taking time to come for your Medicare Wellness Visit. I appreciate your ongoing commitment to your health goals. Please review the following plan we discussed and let me know if I can assist you in the future.   Referrals/Orders/Follow-Ups/Clinician Recommendations: Remember to update your vaccines as discussed.  This is a list of the screening recommended for you and due dates:  Health Maintenance  Topic Date Due   Zoster (Shingles) Vaccine (1 of 2) Never done   DTaP/Tdap/Td vaccine (2 - Td or Tdap) 10/11/2022   Eye exam for diabetics  12/23/2022   Flu Shot  01/24/2023   Hemoglobin A1C  05/12/2023   Yearly kidney function blood test for diabetes  11/09/2023   Yearly kidney health urinalysis for diabetes  11/09/2023   Complete foot exam   11/09/2023   Colon Cancer Screening  03/18/2024   Mammogram  03/19/2024   Medicare Annual Wellness Visit  03/31/2024   Pneumonia Vaccine  Completed   DEXA scan (bone density measurement)  Completed   HPV Vaccine  Aged Out   COVID-19 Vaccine  Discontinued   Hepatitis C Screening  Discontinued    Advanced directives: (Declined) Advance directive discussed with you today. Even though you declined this today, please call our office should you change your mind, and we can give you the proper paperwork for you to fill out.  Next Medicare Annual Wellness Visit scheduled for next year: Yes 04/01/24 @ 9:45

## 2023-04-01 NOTE — Progress Notes (Signed)
Subjective:   Marissa Rodriguez is a 82 y.o. female who presents for Medicare Annual (Subsequent) preventive examination.  Visit Complete: Virtual  I connected with  Marissa Rodriguez on 04/01/23 by a audio enabled telemedicine application and verified that I am speaking with the correct person using two identifiers.  Patient Location: Home  Provider Location: Office/Clinic  I discussed the limitations of evaluation and management by telemedicine. The patient expressed understanding and agreed to proceed. . Because this visit was a virtual/telehealth visit, some criteria may be missing or patient reported. Any vitals not documented were not able to be obtained and vitals that have been documented are patient reported.   Patient was able to provide some vital signs which has been documented.  Cardiac Risk Factors include: advanced age (>65men, >33 women);diabetes mellitus;dyslipidemia;hypertension;obesity (BMI >30kg/m2)     Objective:    Today's Vitals   04/01/23 0946  BP: 126/67  Pulse: 68  Weight: 190 lb (86.2 kg)  Height: 5\' 5"  (1.651 m)   Body mass index is 31.62 kg/m.     04/01/2023    9:59 AM 03/27/2022    9:24 AM 03/14/2021    9:54 AM 03/11/2020   10:50 AM 03/19/2019    9:38 AM 03/09/2019    9:30 AM 03/05/2018    9:50 AM  Advanced Directives  Does Patient Have a Medical Advance Directive? No No No No No No No  Would patient like information on creating a medical advance directive? No - Patient declined No - Patient declined No - Patient declined No - Patient declined No - Patient declined No - Patient declined Yes (MAU/Ambulatory/Procedural Areas - Information given)    Current Medications (verified) Outpatient Encounter Medications as of 04/01/2023  Medication Sig   amLODipine (NORVASC) 5 MG tablet Take 1 tablet by mouth once daily   atorvastatin (LIPITOR) 40 MG tablet Take 1 tablet (40 mg total) by mouth daily.   blood glucose meter kit and supplies Dispense based on  patient and insurance preference. Use to check blood sugars twice daily. (FOR ICD-10 E11.69).   cholecalciferol (VITAMIN D3) 25 MCG (1000 UNIT) tablet Take 1,000 Units by mouth daily.   FARXIGA 10 MG TABS tablet TAKE 1 TABLET BY MOUTH ONCE DAILY BEFORE BREAKFAST   glucose blood (ONETOUCH VERIO) test strip USE TO TEST BLOOD SUGAR UP TO TWICE DAILY   Lancets (ONETOUCH DELICA PLUS LANCET33G) MISC USE TO TEST BLOOD SUGAR TWICE DAILY   levothyroxine (SYNTHROID) 100 MCG tablet Take 1 tablet (100 mcg total) by mouth daily before breakfast.   metoprolol succinate (TOPROL-XL) 25 MG 24 hr tablet Take 1 tablet (25 mg total) by mouth at bedtime.   Semaglutide, 2 MG/DOSE, 8 MG/3ML SOPN Inject 2 mg as directed once a week.   telmisartan (MICARDIS) 80 MG tablet Take 1 tablet (80 mg total) by mouth daily.   Omega-3 Fatty Acids (FISH OIL) 1000 MG CAPS Take 1 capsule by mouth daily. (Patient not taking: Reported on 04/01/2023)   No facility-administered encounter medications on file as of 04/01/2023.    Allergies (verified) Patient has no known allergies.   History: Past Medical History:  Diagnosis Date   Closed fracture of part of upper end of humerus 09/24/2015   Diabetes mellitus without complication (HCC)    History of MRI of brain and brain stem 10/2010   normal MRI/MRA done to rule out CVA   Hyperkalemia 01/16/2021   Hyperlipidemia    Hypertension    Hypothyroidism  Tubular adenoma of colon 04/2009   repeat due 2015   Past Surgical History:  Procedure Laterality Date   ABDOMINAL HYSTERECTOMY  1975   TAH/LSO, wedge resection of right   APPENDECTOMY  1975   incidental, done during TAH   COLONOSCOPY WITH PROPOFOL N/A 03/19/2019   Procedure: COLONOSCOPY WITH BIOPSIES;  Surgeon: Midge Minium, MD;  Location: [facility];  Service: Endoscopy;  Laterality: N/A;  Diabetic   POLYPECTOMY N/A 03/19/2019   Procedure: POLYPECTOMY;  Surgeon: Midge Minium, MD;  Location: [facility];   Service: Endoscopy;  Laterality: N/A;   Family History  Problem Relation Age of Onset   Cancer Father        Lung    Breast cancer Sister 23   Social History   Socioeconomic History   Marital status: Widowed    Spouse name: Not on file   Number of children: Not on file   Years of education: Not on file   Highest education level: Not on file  Occupational History   Not on file  Tobacco Use   Smoking status: Never   Smokeless tobacco: Never  Vaping Use   Vaping status: Never Used  Substance and Sexual Activity   Alcohol use: No   Drug use: No   Sexual activity: Not on file  Other Topics Concern   Not on file  Social History Narrative   widow   Social Determinants of Health   Financial Resource Strain: Low Risk  (04/01/2023)   Overall Financial Resource Strain (CARDIA)    Difficulty of Paying Living Expenses: Not hard at all  Food Insecurity: No Food Insecurity (04/01/2023)   Hunger Vital Sign    Worried About Running Out of Food in the Last Year: Never true    Ran Out of Food in the Last Year: Never true  Transportation Needs: No Transportation Needs (04/01/2023)   PRAPARE - Administrator, Civil Service (Medical): No    Lack of Transportation (Non-Medical): No  Physical Activity: Inactive (04/01/2023)   Exercise Vital Sign    Days of Exercise per Week: 0 days    Minutes of Exercise per Session: 0 min  Stress: No Stress Concern Present (04/01/2023)   [employer] of Occupational Health - Occupational Stress Questionnaire    Feeling of Stress : Not at all  Social Connections: Moderately Integrated (04/01/2023)   Social Connection and Isolation Panel [NHANES]    Frequency of Communication with Friends and Family: More than three times a week    Frequency of Social Gatherings with Friends and Family: More than three times a week    Attends Religious Services: More than 4 times per year    Active Member of [employer] or Organizations: Yes    Attends Tax inspector Meetings: More than 4 times per year    Marital Status: Widowed    Tobacco Counseling Counseling given: Not Answered   Clinical Intake:  Pre-visit preparation completed: Yes  Pain : No/denies pain     BMI - recorded: 31.62 Nutritional Status: BMI > 30  Obese Nutritional Risks: None Diabetes: Yes CBG done?: Yes (FBS 103) CBG resulted in Enter/ Edit results?: No Did pt. bring in CBG monitor from home?: No  How often do you need to have someone help you when you read instructions, pamphlets, or other written materials from your doctor or pharmacy?: 1 - Never  Interpreter Needed?: No  Information entered by :: R. Julliana Whitmyer LPN   Activities of  Daily Living    04/01/2023    9:49 AM  In your present state of health, do you have any difficulty performing the following activities:  Hearing? 0  Vision? 0  Comment readers  Difficulty concentrating or making decisions? 0  Walking or climbing stairs? 0  Dressing or bathing? 0  Doing errands, shopping? 0  Preparing Food and eating ? N  Using the Toilet? N  In the past six months, have you accidently leaked urine? N  Do you have problems with loss of bowel control? N  Managing your Medications? N  Managing your Finances? N  Housekeeping or managing your Housekeeping? N    Patient Care Team: Sherlene Shams, MD as PCP - General (Internal Medicine)  Indicate any recent Medical Services you may have received from other than [facility] providers in the past year (date may be approximate).     Assessment:   This is a routine wellness examination for [place].  Hearing/Vision screen Hearing Screening - Comments:: No issues Vision Screening - Comments:: readers   Goals Addressed             This Visit's Progress    Patient Stated       Wants to exercise       Depression Screen    04/01/2023    9:54 AM 11/09/2022    9:07 AM 05/09/2022   10:11 AM 03/27/2022    9:23 AM 11/06/2021    9:55 AM 04/26/2021    9:32 AM  03/14/2021    9:55 AM  PHQ 2/9 Scores  PHQ - 2 Score 0 0 0 0 0 0 0  PHQ- 9 Score 1          Fall Risk    04/01/2023    9:51 AM 11/09/2022    9:07 AM 05/09/2022   10:11 AM 03/27/2022    9:30 AM 11/06/2021    9:55 AM  Fall Risk   Falls in the past year? 0 0 0 0 0  Number falls in past yr: 0 0  0   Injury with Fall? 0 0  0   Risk for fall due to : No Fall Risks No Fall Risks No Fall Risks  No Fall Risks  Follow up Falls prevention discussed;Falls evaluation completed Falls evaluation completed Falls evaluation completed Falls evaluation completed Falls evaluation completed    MEDICARE RISK AT HOME: Medicare Risk at Home Any stairs in or around the home?: Yes If so, are there any without handrails?: No Home free of loose throw rugs in walkways, pet beds, electrical cords, etc?: Yes Adequate lighting in your home to reduce risk of falls?: Yes Life alert?: No Use of a cane, walker or w/c?: No Grab bars in the bathroom?: No Shower chair or bench in shower?: No Elevated toilet seat or a handicapped toilet?: Yes     Cognitive Function:    03/05/2018    9:58 AM  MMSE - Mini Mental State Exam  Orientation to time 5  Orientation to Place 5  Registration 3  Recall 3  Language- repeat 1  Language- read & follow direction 1  Copy design 1        04/01/2023   10:00 AM 03/27/2022    9:32 AM 03/09/2019    9:18 AM  6CIT Screen  What Year? 0 points 0 points 0 points  What month? 0 points 0 points 0 points  What time? 0 points 0 points 0 points  Count back from 20  0 points 0 points 0 points  Months in reverse 0 points 0 points 0 points  Repeat phrase 2 points 0 points   Total Score 2 points 0 points     Immunizations Immunization History  Administered Date(s) Administered   Influenza, High Dose Seasonal PF 03/24/2015, 03/05/2018, 03/30/2019   Influenza,inj,Quad PF,6+ Mos 07/01/2013   Influenza-Unspecified 04/11/2021, 03/01/2022   Pneumococcal Conjugate-13 03/17/2014,  03/30/2019   Pneumococcal Polysaccharide-23 07/02/2011   Tdap 10/10/2012    TDAP status: Due, Education has been provided regarding the importance of this vaccine. Advised may receive this vaccine at local pharmacy or Health Dept. Aware to provide a copy of the vaccination record if obtained from local pharmacy or Health Dept. Verbalized acceptance and understanding.  Flu Vaccine status: Due, Education has been provided regarding the importance of this vaccine. Advised may receive this vaccine at local pharmacy or Health Dept. Aware to provide a copy of the vaccination record if obtained from local pharmacy or Health Dept. Verbalized acceptance and understanding.  Pneumococcal vaccine status: Up to date  Covid-19 vaccine status: Information provided on how to obtain vaccines.   Qualifies for Shingles Vaccine? Yes   Zostavax completed No   Shingrix Completed?: No.    Education has been provided regarding the importance of this vaccine. Patient has been advised to call insurance company to determine out of pocket expense if they have not yet received this vaccine. Advised may also receive vaccine at local pharmacy or Health Dept. Verbalized acceptance and understanding.  Screening Tests Health Maintenance  Topic Date Due   Zoster Vaccines- Shingrix (1 of 2) Never done   DTaP/Tdap/Td (2 - Td or Tdap) 10/11/2022   OPHTHALMOLOGY EXAM  12/23/2022   INFLUENZA VACCINE  01/24/2023   Medicare Annual Wellness (AWV)  03/28/2023   HEMOGLOBIN A1C  05/12/2023   Diabetic kidney evaluation - eGFR measurement  11/09/2023   Diabetic kidney evaluation - Urine ACR  11/09/2023   FOOT EXAM  11/09/2023   Colonoscopy  03/18/2024   MAMMOGRAM  03/19/2024   Pneumonia Vaccine 61+ Years old  Completed   DEXA SCAN  Completed   HPV VACCINES  Aged Out   COVID-19 Vaccine  Discontinued   Hepatitis C Screening  Discontinued    Health Maintenance  Health Maintenance Due  Topic Date Due   Zoster Vaccines-  Shingrix (1 of 2) Never done   DTaP/Tdap/Td (2 - Td or Tdap) 10/11/2022   OPHTHALMOLOGY EXAM  12/23/2022   INFLUENZA VACCINE  01/24/2023   Medicare Annual Wellness (AWV)  03/28/2023    Colorectal cancer screening: Type of screening: Colonoscopy. Completed 02/2019. Repeat every 5 years  Mammogram status: Completed 02/2023. Repeat every year  Bone Density status: Completed 12/2016. Results reflect: Bone density results: NORMAL. Repeat every 2 years.Wants to discuss with PCP at next office visit  Lung Cancer Screening: (Low Dose CT Chest recommended if Age 7-80 years, 20 pack-year currently smoking OR have quit w/in 15years.) does not qualify.     Additional Screening:  Hepatitis C Screening: does qualify; Completed 02/2015  Vision Screening: Recommended annual ophthalmology exams for early detection of glaucoma and other disorders of the eye. Is the patient up to date with their annual eye exam?  Yes  Who is the provider or what is the name of the office in which the patient attends annual eye exams?  Eye If pt is not established with a provider, would they like to be referred to a provider to establish care? No .  Dental Screening: Recommended annual dental exams for proper oral hygiene  Diabetic Foot Exam: Diabetic Foot Exam: Completed 10/2022  Community Resource Referral / Chronic Care Management: CRR required this visit?  No   CCM required this visit?  No     Plan:     I have personally reviewed and noted the following in the patient's chart:   Medical and social history Use of alcohol, tobacco or illicit drugs  Current medications and supplements including opioid prescriptions. Patient is not currently taking opioid prescriptions. Functional ability and status Nutritional status Physical activity Advanced directives List of other physicians Hospitalizations, surgeries, and ER visits in previous 12 months Vitals Screenings to include cognitive, depression,  and falls Referrals and appointments  In addition, I have reviewed and discussed with patient certain preventive protocols, quality metrics, and best practice recommendations. A written personalized care plan for preventive services as well as general preventive health recommendations were provided to patient.     Sydell Axon, LPN   [id number]   After Visit Summary: (MyChart) Due to this being a telephonic visit, the after visit summary with patients personalized plan was offered to patient via MyChart   Nurse Notes: None

## 2023-05-13 ENCOUNTER — Ambulatory Visit: Payer: Medicare HMO | Admitting: Internal Medicine

## 2023-05-14 ENCOUNTER — Telehealth: Payer: Self-pay

## 2023-05-14 ENCOUNTER — Encounter: Payer: Self-pay | Admitting: Internal Medicine

## 2023-05-14 ENCOUNTER — Ambulatory Visit (INDEPENDENT_AMBULATORY_CARE_PROVIDER_SITE_OTHER): Payer: Medicare HMO | Admitting: Internal Medicine

## 2023-05-14 VITALS — BP 126/70 | HR 78 | Ht 65.0 in | Wt 194.1 lb

## 2023-05-14 DIAGNOSIS — Z23 Encounter for immunization: Secondary | ICD-10-CM | POA: Diagnosis not present

## 2023-05-14 DIAGNOSIS — Z7984 Long term (current) use of oral hypoglycemic drugs: Secondary | ICD-10-CM

## 2023-05-14 DIAGNOSIS — N1832 Chronic kidney disease, stage 3b: Secondary | ICD-10-CM

## 2023-05-14 DIAGNOSIS — E782 Mixed hyperlipidemia: Secondary | ICD-10-CM | POA: Diagnosis not present

## 2023-05-14 DIAGNOSIS — E034 Atrophy of thyroid (acquired): Secondary | ICD-10-CM | POA: Diagnosis not present

## 2023-05-14 DIAGNOSIS — E1122 Type 2 diabetes mellitus with diabetic chronic kidney disease: Secondary | ICD-10-CM

## 2023-05-14 DIAGNOSIS — E669 Obesity, unspecified: Secondary | ICD-10-CM

## 2023-05-14 DIAGNOSIS — Z7985 Long-term (current) use of injectable non-insulin antidiabetic drugs: Secondary | ICD-10-CM | POA: Diagnosis not present

## 2023-05-14 DIAGNOSIS — I10 Essential (primary) hypertension: Secondary | ICD-10-CM | POA: Diagnosis not present

## 2023-05-14 LAB — COMPREHENSIVE METABOLIC PANEL WITH GFR
ALT: 20 U/L (ref 0–35)
AST: 24 U/L (ref 0–37)
Albumin: 4.3 g/dL (ref 3.5–5.2)
Alkaline Phosphatase: 46 U/L (ref 39–117)
BUN: 22 mg/dL (ref 6–23)
CO2: 26 meq/L (ref 19–32)
Calcium: 10 mg/dL (ref 8.4–10.5)
Chloride: 102 meq/L (ref 96–112)
Creatinine, Ser: 1.15 mg/dL (ref 0.40–1.20)
GFR: 44.72 mL/min — ABNORMAL LOW
Glucose, Bld: 111 mg/dL — ABNORMAL HIGH (ref 70–99)
Potassium: 4.7 meq/L (ref 3.5–5.1)
Sodium: 137 meq/L (ref 135–145)
Total Bilirubin: 0.6 mg/dL (ref 0.2–1.2)
Total Protein: 7.9 g/dL (ref 6.0–8.3)

## 2023-05-14 LAB — LDL CHOLESTEROL, DIRECT: Direct LDL: 79 mg/dL

## 2023-05-14 LAB — TSH: TSH: 0.79 u[IU]/mL (ref 0.35–5.50)

## 2023-05-14 LAB — LIPID PANEL
Cholesterol: 138 mg/dL (ref 0–200)
HDL: 36.7 mg/dL — ABNORMAL LOW
LDL Cholesterol: 73 mg/dL (ref 0–99)
NonHDL: 101.45
Total CHOL/HDL Ratio: 4
Triglycerides: 141 mg/dL (ref 0.0–149.0)
VLDL: 28.2 mg/dL (ref 0.0–40.0)

## 2023-05-14 LAB — HEMOGLOBIN A1C: Hgb A1c MFr Bld: 6.3 % (ref 4.6–6.5)

## 2023-05-14 MED ORDER — METOPROLOL SUCCINATE ER 25 MG PO TB24: 25.0000 mg | ORAL_TABLET | Freq: Every day | ORAL | 1 refills | Status: DC

## 2023-05-14 MED ORDER — TIRZEPATIDE 10 MG/0.5ML ~~LOC~~ SOAJ: 10.0000 mg | SUBCUTANEOUS | 2 refills | Status: DC

## 2023-05-14 MED ORDER — ATORVASTATIN CALCIUM 40 MG PO TABS: 40.0000 mg | ORAL_TABLET | Freq: Every day | ORAL | 1 refills | Status: DC

## 2023-05-14 MED ORDER — LEVOTHYROXINE SODIUM 100 MCG PO TABS: 100.0000 ug | ORAL_TABLET | Freq: Every day | ORAL | 1 refills | Status: DC

## 2023-05-14 MED ORDER — TELMISARTAN 80 MG PO TABS: 80.0000 mg | ORAL_TABLET | Freq: Every day | ORAL | 1 refills | Status: DC

## 2023-05-14 NOTE — Assessment & Plan Note (Signed)
Weight loss has stopped on maximal dose of ozempic.  Not exercising,  encouaged to start walking program and will try to swtich to [facility]

## 2023-05-14 NOTE — Patient Instructions (Addendum)
I would like to change your diabetes therapy from ozempic to [facility] because you have "maxed out" the ozempic dose and still have more weight to lose.  We first need to see if pharmacy will fill the mounjaro.  If they will not,  we will go through the medication assistance program.   So you will need to  stay on ozempic until you hear from [place].     You are due for the follow vaccines which will be free at your pharmacy:  TDaP (tetanus.whooping cough) Shingrix (shingles)

## 2023-05-14 NOTE — Telephone Encounter (Signed)
Pt was in the office this morning for an appointment with Dr. Darrick Huntsman and she stated that she has not heard from anyone about renewing her [employer] application for her Ozempic.

## 2023-05-14 NOTE — Assessment & Plan Note (Signed)
.  complicated by proteinuria and  and diabetic retinopathy.  Improved control with ozempic and [place]. .Ozempic dose is at maximal level at  2.0 mg weekly  and she is receiving both meds frm [facility] pharmacy  .  will attempt to get her Mounjaro .   Continue follow up with Lateef her nephrologist; GFR is stable ; hyperkalemia has resolved. Labs are pending   Lab Results  Component Value Date   HGBA1C 6.1 11/09/2022   Lab Results  Component Value Date   NA 139 11/09/2022   K 4.4 11/09/2022   CL 104 11/09/2022   CO2 25 11/09/2022   Lab Results  Component Value Date   CREATININE 1.43 (H) 11/09/2022

## 2023-05-14 NOTE — Assessment & Plan Note (Signed)
she reports compliance with 3 drug regimen (amlodipine metoprolol and telmisartan ) .  She is not using NSAIDs daily. Reviewed  goal of 130/80 for patients over 70)  to preserve renal function.  She has been reminded to continue home checks of BP and submit readings for evaluation. Renal function, electrolytes and screen for proteinuria are all up to date, GFR is low but  stable   Lab Results  Component Value Date   CREATININE 1.43 (H) 11/09/2022   Lab Results  Component Value Date   NA 139 11/09/2022   K 4.4 11/09/2022   CL 104 11/09/2022   CO2 25 11/09/2022

## 2023-05-14 NOTE — Assessment & Plan Note (Signed)
Thyroid function has been  WNL on current levothyroxine  dose. Of 100 mcg    Repeat level ordered   Lab Results  Component Value Date   TSH 1.72 11/09/2022

## 2023-05-14 NOTE — Progress Notes (Signed)
Subjective:  Patient ID: Marissa Rodriguez, female    DOB: 09-07-1940  Age: 82 y.o. MRN: [id number]  CC: The primary encounter diagnosis was Essential hypertension. Diagnoses of Hypothyroidism due to acquired atrophy of thyroid, Type 2 diabetes mellitus with stage 3b chronic kidney disease, without long-term current use of insulin (HCC), Mixed hyperlipidemia, and Obesity (BMI 30-39.9) were also pertinent to this visit.   HPI Marissa Rodriguez presents for  Chief Complaint  Patient presents with   Medical Management of Chronic Issues    6 month follow up    1) obesity diabetes  she is taking ozempic and Farxiga obtained through the Medication assistance program.  She did not tolerate metformin due to persistent diarrhea, and she has had a weight loss of ten lbs since start in Nov 2022., but has not lost any weight in over 6 months    Home weights have hovered between  189 and 192 .  Not exercising.  She states that she has no orthopedic barriers to walking ; "I'm just lazy."  .  2)  Hypertension: patient checks blood pressure twice weekly at home.  Readings have been for the most part <130/80 at rest . Patient is following a reduced salt diet most days and is taking medications as prescribed   Outpatient Medications Prior to Visit  Medication Sig Dispense Refill   amLODipine (NORVASC) 5 MG tablet Take 1 tablet by mouth once daily 90 tablet 1   blood glucose meter kit and supplies Dispense based on patient and insurance preference. Use to check blood sugars twice daily. (FOR ICD-10 E11.69). 1 each 0   cholecalciferol (VITAMIN D3) 25 MCG (1000 UNIT) tablet Take 1,000 Units by mouth daily.     FARXIGA 10 MG TABS tablet TAKE 1 TABLET BY MOUTH ONCE DAILY BEFORE BREAKFAST 30 tablet 0   glucose blood (ONETOUCH VERIO) test strip USE TO TEST BLOOD SUGAR UP TO TWICE DAILY 100 each 0   Lancets (ONETOUCH DELICA PLUS LANCET33G) MISC USE TO TEST BLOOD SUGAR TWICE DAILY     Semaglutide, 2 MG/DOSE, 8 MG/3ML SOPN  Inject 2 mg as directed once a week. 3 mL 2   atorvastatin (LIPITOR) 40 MG tablet Take 1 tablet (40 mg total) by mouth daily. 90 tablet 1   levothyroxine (SYNTHROID) 100 MCG tablet Take 1 tablet (100 mcg total) by mouth daily before breakfast. 90 tablet 1   metoprolol succinate (TOPROL-XL) 25 MG 24 hr tablet Take 1 tablet (25 mg total) by mouth at bedtime. 90 tablet 1   telmisartan (MICARDIS) 80 MG tablet Take 1 tablet (80 mg total) by mouth daily. 90 tablet 1   Omega-3 Fatty Acids (FISH OIL) 1000 MG CAPS Take 1 capsule by mouth daily. (Patient not taking: Reported on 05/14/2023)     No facility-administered medications prior to visit.    Review of Systems;  Patient denies headache, fevers, malaise, unintentional weight loss, skin rash, eye pain, sinus congestion and sinus pain, sore throat, dysphagia,  hemoptysis , cough, dyspnea, wheezing, chest pain, palpitations, orthopnea, edema, abdominal pain, nausea, melena, diarrhea, constipation, flank pain, dysuria, hematuria, urinary  Frequency, nocturia, numbness, tingling, seizures,  Focal weakness, Loss of consciousness,  Tremor, insomnia, depression, anxiety, and suicidal ideation.      Objective:  BP 126/70   Pulse 78   Ht 5\' 5"  (1.651 m)   Wt 194 lb 1.9 oz (88.1 kg)   SpO2 97%   BMI 32.30 kg/m   BP Readings from Last  3 Encounters:  05/14/23 126/70  04/01/23 126/67  11/09/22 120/72    Wt Readings from Last 3 Encounters:  05/14/23 194 lb 1.9 oz (88.1 kg)  04/01/23 190 lb (86.2 kg)  11/09/22 194 lb 6.4 oz (88.2 kg)    Physical Exam Vitals reviewed.  Constitutional:      General: She is not in acute distress.    Appearance: Normal appearance. She is obese. She is not ill-appearing, toxic-appearing or diaphoretic.  HENT:     Head: Normocephalic.  Eyes:     General: No scleral icterus.       Right eye: No discharge.        Left eye: No discharge.     Conjunctiva/sclera: Conjunctivae normal.  Cardiovascular:     Rate and  Rhythm: Normal rate and regular rhythm.     Heart sounds: Normal heart sounds.  Pulmonary:     Effort: Pulmonary effort is normal. No respiratory distress.     Breath sounds: Normal breath sounds.  Musculoskeletal:        General: Normal range of motion.  Skin:    General: Skin is warm and dry.  Neurological:     General: No focal deficit present.     Mental Status: She is alert and oriented to person, place, and time. Mental status is at baseline.  Psychiatric:        Mood and Affect: Mood normal.        Behavior: Behavior normal.        Thought Content: Thought content normal.        Judgment: Judgment normal.    Lab Results  Component Value Date   HGBA1C 6.1 11/09/2022   HGBA1C 6.3 05/04/2022   HGBA1C 6.2 11/03/2021    Lab Results  Component Value Date   CREATININE 1.43 (H) 11/09/2022   CREATININE 1.43 (H) 05/04/2022   CREATININE 1.30 (H) 11/03/2021    Lab Results  Component Value Date   WBC 9.4 02/19/2019   HGB 12.7 02/19/2019   HCT 38.6 02/19/2019   PLT 262.0 02/19/2019   GLUCOSE 112 (H) 11/09/2022   CHOL 137 01/16/2023   TRIG 136.0 01/16/2023   HDL 40.40 01/16/2023   LDLDIRECT 75.0 01/16/2023   LDLCALC 69 01/16/2023   ALT 22 11/09/2022   AST 26 11/09/2022   NA 139 11/09/2022   K 4.4 11/09/2022   CL 104 11/09/2022   CREATININE 1.43 (H) 11/09/2022   BUN 21 11/09/2022   CO2 25 11/09/2022   TSH 1.72 11/09/2022   HGBA1C 6.1 11/09/2022   MICROALBUR 1.9 02/27/2023    MM 3D SCREENING MAMMOGRAM BILATERAL BREAST  Result Date: 03/21/2023 CLINICAL DATA:  Screening. EXAM: DIGITAL SCREENING BILATERAL MAMMOGRAM WITH TOMOSYNTHESIS AND CAD TECHNIQUE: Bilateral screening digital craniocaudal and mediolateral oblique mammograms were obtained. Bilateral screening digital breast tomosynthesis was performed. The images were evaluated with computer-aided detection. COMPARISON:  Previous exam(s). ACR Breast Density Category c: The breasts are heterogeneously dense, which may  obscure small masses. FINDINGS: There are no findings suspicious for malignancy. IMPRESSION: No mammographic evidence of malignancy. A result letter of this screening mammogram will be mailed directly to the patient. RECOMMENDATION: Screening mammogram in one year. (Code:[id number]-B-01Y) BI-RADS CATEGORY  1: Negative. Electronically Signed   By: Harmon Pier M.D.   On: 03/21/2023 12:10    Assessment & Plan:  .Essential hypertension Assessment & Plan: she reports compliance with 3 drug regimen (amlodipine metoprolol and telmisartan ) .  She is not using NSAIDs daily. Reviewed  goal of 130/80 for patients over 70)  to preserve renal function.  She has been reminded to continue home checks of BP and submit readings for evaluation. Renal function, electrolytes and screen for proteinuria are all up to date, GFR is low but  stable   Lab Results  Component Value Date   CREATININE 1.43 (H) 11/09/2022   Lab Results  Component Value Date   NA 139 11/09/2022   K 4.4 11/09/2022   CL 104 11/09/2022   CO2 25 11/09/2022     Orders: -     Comprehensive metabolic panel  Hypothyroidism due to acquired atrophy of thyroid Assessment & Plan: Thyroid function has been  WNL on current levothyroxine  dose. Of 100 mcg    Repeat level ordered   Lab Results  Component Value Date   TSH 1.72 11/09/2022     Orders: -     Levothyroxine Sodium; Take 1 tablet (100 mcg total) by mouth daily before breakfast.  Dispense: 90 tablet; Refill: 1 -     TSH  Type 2 diabetes mellitus with stage 3b chronic kidney disease, without long-term current use of insulin (HCC) Assessment & Plan: .complicated by proteinuria and  and diabetic retinopathy.  Improved control with ozempic and [place]. .Ozempic dose is at maximal level at  2.0 mg weekly  and she is receiving both meds frm [facility] pharmacy  .  will attempt to get her Mounjaro .   Continue follow up with Lateef her nephrologist; GFR is stable ; hyperkalemia has resolved. Labs are  pending   Lab Results  Component Value Date   HGBA1C 6.1 11/09/2022   Lab Results  Component Value Date   NA 139 11/09/2022   K 4.4 11/09/2022   CL 104 11/09/2022   CO2 25 11/09/2022   Lab Results  Component Value Date   CREATININE 1.43 (H) 11/09/2022       Orders: -     Hemoglobin A1c -     Comprehensive metabolic panel  Mixed hyperlipidemia -     Lipid panel -     LDL cholesterol, direct  Obesity (BMI 30-39.9) Assessment & Plan: Weight loss has stopped on maximal dose of ozempic.  Not exercising,  encouaged to start walking program and will try to swtich to [facility]    Other orders -     Atorvastatin Calcium; Take 1 tablet (40 mg total) by mouth daily.  Dispense: 90 tablet; Refill: 1 -     Metoprolol Succinate ER; Take 1 tablet (25 mg total) by mouth at bedtime.  Dispense: 90 tablet; Refill: 1 -     Telmisartan; Take 1 tablet (80 mg total) by mouth daily.  Dispense: 90 tablet; Refill: 1 -     Tirzepatide; Inject 10 mg into the skin once a week.  Dispense: 6 mL; Refill: 2    Follow-up: No follow-ups on file.   Sherlene Shams, MD

## 2023-05-17 NOTE — Telephone Encounter (Signed)
PA for Greggory Keen is needed.

## 2023-05-17 NOTE — Telephone Encounter (Signed)
Patient called and said her insurance will not pay for the [facility]. It will cost he over $151 a month, she can not afford that.

## 2023-05-27 ENCOUNTER — Other Ambulatory Visit (HOSPITAL_COMMUNITY): Payer: Self-pay

## 2023-05-27 ENCOUNTER — Telehealth: Payer: Self-pay

## 2023-05-27 ENCOUNTER — Other Ambulatory Visit: Payer: Self-pay | Admitting: Internal Medicine

## 2023-05-27 DIAGNOSIS — E034 Atrophy of thyroid (acquired): Secondary | ICD-10-CM

## 2023-05-27 NOTE — Telephone Encounter (Signed)
Per test claim, Greggory Keen would be covered for the pt, 1 month supply would be $47.00, 3 month supply would be $141.00. Ozempic could still be free through pt assistance, but this is not an option for Mounjaro. Please let us know if you would still like [place] to continue applying for pt assistance for Ozempic, thanks

## 2023-05-27 NOTE — Telephone Encounter (Signed)
Pharmacy Patient Advocate Encounter   Received notification from Pt Calls Messages that prior authorization for Greggory Keen is required/requested.   Insurance verification completed.   The patient is insured through [employer] .   Per test claim: The current 28 day co-pay is, $47.00.  No PA needed at this time. This test claim was processed through [facility]- copay amounts may vary at other pharmacies due to pharmacy/plan contracts, or as the patient moves through the different stages of their insurance plan.

## 2023-05-27 NOTE — Telephone Encounter (Signed)
Apply on line and fax provider portio.

## 2023-05-29 NOTE — Telephone Encounter (Signed)
LMTCB

## 2023-05-29 NOTE — Telephone Encounter (Signed)
Patient states she is returning a call from Sandy Salaam, CMA.  I was unable to reach [place] at the time of the call.  I let patient know that I will leave her a message so she can call her back.

## 2023-05-30 NOTE — Telephone Encounter (Signed)
Spoke with pt and she stated that she is not able to afford the [facility] and would like to stay with the Ozempic. I have placed the [employer] form in your quick sign folder for a signature so she can continue getting the Ozempic.

## 2023-06-03 NOTE — Telephone Encounter (Signed)
faxed

## 2023-06-04 ENCOUNTER — Telehealth: Payer: Self-pay | Admitting: Internal Medicine

## 2023-06-04 NOTE — Telephone Encounter (Signed)
Patient called and would like a call back regarding her medication ozempic and information that needed to be sent. Please call patient.

## 2023-06-05 ENCOUNTER — Telehealth: Payer: Self-pay | Admitting: Internal Medicine

## 2023-06-05 NOTE — Telephone Encounter (Signed)
Patient called and states [employer] has not received the physicians part of the paperwork for the ozempic. Please advise

## 2023-06-06 NOTE — Telephone Encounter (Signed)
Spoke with pt to let her know that we have signed and faxed the provider portion back to the med assist team.

## 2023-06-07 NOTE — Telephone Encounter (Signed)
Fax to [employer] with insurance card

## 2023-06-11 ENCOUNTER — Telehealth: Payer: Self-pay

## 2023-06-11 NOTE — Telephone Encounter (Signed)
error with the first application, missing provider signature reseding back to Dr office.

## 2023-06-13 ENCOUNTER — Telehealth: Payer: Self-pay

## 2023-06-13 NOTE — Telephone Encounter (Signed)
Can you assist with this we have faxed the paperwork. Pt is out of medication.

## 2023-06-13 NOTE — Telephone Encounter (Signed)
Copied from CRM [id number]. Topic: Clinical - Medication Question >> Jun 13, 2023  8:59 AM Kathryne Eriksson wrote: Patient states she's needing some sort of paperwork in order to continue taking OZEMPIC 2MG , she's requesting that someone gives her a call back as soon as possible as she's currently out and will need samples in order to hold her over. Patient states she spoke with Shanda Bumps last week in regards to this matter. >> Jun 13, 2023  9:09 AM Kathryne Eriksson wrote: Patient states she's needing some sort of paperwork in order to continue taking OZEMPIC 2MG , she's requesting that someone gives her a call back as soon as possible as she's currently out and will need samples in order to hold her over. Patient states she spoke with Shanda Bumps last week in regards to this matter.

## 2023-06-14 NOTE — Telephone Encounter (Signed)
Just faxed.

## 2023-06-14 NOTE — Telephone Encounter (Signed)
Form has been placed in quick sign folder.

## 2023-06-20 ENCOUNTER — Telehealth: Payer: Self-pay

## 2023-06-20 NOTE — Telephone Encounter (Signed)
Spoke with pt to let her know that per a verbal from Dr. Darrick Huntsman pt can use the 0.5 mg dose and inject 4 doses at a time to equal 2 mg. Pt stated that that she was fine with that.   Medication Samples have been provided to the patient.  Drug name: Ozempic       Strength: 0.5 mg        Qty: 2 boxes  LOT: [id number], [id number]  Exp.Date: 07/25/2024, 06/24/2024  The patient has been instructed regarding the correct time, dose, and frequency of taking this medication, including desired effects and most common side effects.   Ramona Ruark 2:18 PM 06/20/2023

## 2023-06-20 NOTE — Telephone Encounter (Signed)
Receive Approval for pt's Assistance on [employer] for 05/2024

## 2023-06-20 NOTE — Telephone Encounter (Signed)
Copied from CRM [id number]. Topic: Clinical - Medication Question >> Jun 20, 2023 11:32 AM Kathryne Eriksson wrote: Reason for CRM: Ozempic >> Jun 20, 2023 11:34 AM Kathryne Eriksson wrote: Patient states she received a message that her ozempic was approved, however she was wanting to know if she could come inside the office and pick up a sample until her prescription is ready.

## 2023-06-24 ENCOUNTER — Other Ambulatory Visit: Payer: Self-pay | Admitting: Internal Medicine

## 2023-07-04 ENCOUNTER — Telehealth: Payer: Self-pay | Admitting: Internal Medicine

## 2023-07-04 DIAGNOSIS — I10 Essential (primary) hypertension: Secondary | ICD-10-CM | POA: Diagnosis not present

## 2023-07-04 DIAGNOSIS — N2581 Secondary hyperparathyroidism of renal origin: Secondary | ICD-10-CM | POA: Diagnosis not present

## 2023-07-04 DIAGNOSIS — E1122 Type 2 diabetes mellitus with diabetic chronic kidney disease: Secondary | ICD-10-CM | POA: Diagnosis not present

## 2023-07-04 DIAGNOSIS — N1832 Chronic kidney disease, stage 3b: Secondary | ICD-10-CM | POA: Diagnosis not present

## 2023-07-04 NOTE — Telephone Encounter (Signed)
 Patient is calling in regarding her medication for FARXIGA 10 MG TABS tablet  medication  she would like a call back

## 2023-07-05 ENCOUNTER — Other Ambulatory Visit: Payer: Self-pay | Admitting: Internal Medicine

## 2023-07-05 DIAGNOSIS — E1122 Type 2 diabetes mellitus with diabetic chronic kidney disease: Secondary | ICD-10-CM

## 2023-07-05 NOTE — Telephone Encounter (Signed)
 Copied from CRM [id number]. Topic: Clinical - Medication Question >> Jul 05, 2023  4:00 PM Chantha C wrote: Reason for CRM: ***

## 2023-07-09 ENCOUNTER — Telehealth: Payer: Self-pay

## 2023-07-09 DIAGNOSIS — E1122 Type 2 diabetes mellitus with diabetic chronic kidney disease: Secondary | ICD-10-CM

## 2023-07-09 MED ORDER — DAPAGLIFLOZIN PROPANEDIOL 10 MG PO TABS: 10.0000 mg | ORAL_TABLET | Freq: Every day | ORAL | 3 refills | Status: AC

## 2023-07-09 NOTE — Addendum Note (Signed)
 Addended by: Sherlene Shams on: 07/09/2023 11:24 AM   Modules accepted: Orders

## 2023-07-09 NOTE — Telephone Encounter (Signed)
 PAP: Patient assistance application Farxiga  for has been approved by PAP Companies: [employer] from 06/26/23 to 06/24/2024. Medication should be delivered to PAP Delivery: Home For further shipping updates, please contact [employer] ([employer]) at [phone] Pt ID is: No ID  Please be advised Patient needs New prescription sent to [employer] I have added [facility] to preferred pharmacy in snap shot of chart.

## 2023-07-10 ENCOUNTER — Telehealth: Payer: Self-pay | Admitting: Internal Medicine

## 2023-07-10 NOTE — Telephone Encounter (Signed)
Copied from CRM [id number]. Topic: Clinical - Prescription Issue >> Jul 10, 2023 10:12 AM [name] wrote: Reason for CRM: Patient calling to check status of refill for the dapagliflozin propanediol (FARXIGA) 10 MG TABS tablet, the assistance program is waiting on a prescription from provider but refill was sent to mail order pharmacy. Patient states Shanda Bumps knows that the prescription is supposed to go to [facility] ([employer]) based off of previous notes as well. Please reach out to patient.  Marissa Rodriguez  [phone]

## 2023-07-10 NOTE — Telephone Encounter (Signed)
 Spoke to pt. Informed pt that the prescription was sent to [facility] who is in partnership with [employer].

## 2023-07-11 ENCOUNTER — Other Ambulatory Visit: Payer: Self-pay | Admitting: Internal Medicine

## 2023-07-11 DIAGNOSIS — E1121 Type 2 diabetes mellitus with diabetic nephropathy: Secondary | ICD-10-CM

## 2023-07-24 ENCOUNTER — Telehealth: Payer: Self-pay

## 2023-07-24 NOTE — Telephone Encounter (Signed)
Copied from CRM [id number]. Topic: Clinical - Prescription Issue >> Jul 24, 2023  9:51 AM Corin V wrote: Reason for CRM: Patient called in to let Dr. Darrick Huntsman know that the patient assistance program that send the Ozempic to the office for the patient is running about 14 days behind. She was unsure if Dr. Darrick Huntsman could provide a sample or if she was okay with the patient waiting until it comes in to get her next dose as she is out of the medication.

## 2023-07-24 NOTE — Telephone Encounter (Signed)
Pt is currently using the 2 mg dose. We only have the starting doses for Ozempic.

## 2023-07-24 NOTE — Telephone Encounter (Signed)
Medication Samples have been provided to the patient.  Drug name: Ozempic       Strength: 0.5 mg        Qty: 1 box  LOT: [id number]  Exp.Date: 11/22/2024   The patient has been instructed regarding the correct time, dose, and frequency of taking this medication, including desired effects and most common side effects.   Marissa Rodriguez 2:35 PM 07/24/2023

## 2023-07-25 NOTE — Telephone Encounter (Signed)
Pt picked up.

## 2023-07-31 NOTE — Telephone Encounter (Signed)
 Pt has been Approve for 2025 [employer] Application.left a HIPAA VM.

## 2023-08-08 ENCOUNTER — Telehealth: Payer: Self-pay

## 2023-08-08 MED ORDER — SEMAGLUTIDE (2 MG/DOSE) 8 MG/3ML ~~LOC~~ SOPN: 2.0000 mg | PEN_INJECTOR | SUBCUTANEOUS | 0 refills | Status: DC

## 2023-08-08 NOTE — Telephone Encounter (Signed)
Copied from CRM [id number]. Topic: Clinical - Medical Advice >> Aug 08, 2023  2:13 PM [name] wrote: Reason for CRM: Patient states she is out of her Ozempic and is supposed to take a dose tomorrow, patient is requesting a call back from Dr. Ceasar Lund nurse for advise on what she should do.

## 2023-08-08 NOTE — Addendum Note (Signed)
Addended by: Sandy Salaam on: 08/08/2023 04:59 PM   Modules accepted: Orders

## 2023-08-08 NOTE — Telephone Encounter (Signed)
Pt was given the number to call to request the voucher. Pt was also advised that once she gets the voucher to let me know so I can send a rx to her local pharmacy.

## 2023-08-08 NOTE — Telephone Encounter (Signed)
Copied from CRM [id number]. Topic: General - Other >> Aug 08, 2023  4:44 PM Denese Killings wrote: Reason for CRM: Patient wants to let Shanda Bumps know that she received her voucher for Ozempic to be called in.

## 2023-08-08 NOTE — Telephone Encounter (Signed)
Rx has been sent and pt is aware

## 2023-08-08 NOTE — Telephone Encounter (Signed)
We still have not received pt's Ozempic. We have given her samples on two occasions already. Do we know why she has not received her Ozempic from [employer] yet?

## 2023-09-02 ENCOUNTER — Telehealth: Payer: Self-pay

## 2023-09-02 NOTE — Telephone Encounter (Signed)
 Copied from CRM [id number]. Topic: General - Other >> Sep 02, 2023 10:34 AM Alcus Dad wrote: Reason for CRM: Patient stated she took her last shot of Ozempic last week and she is completely out. The program is running behind on delivery and she doesn't know what to do. If you could please call patient back

## 2023-09-03 NOTE — Telephone Encounter (Signed)
 Spoke with Marissa Rodriguez to let her know that the pt has already gotten one voucher and is out of medication again. Marissa Rodriguez advised that pt should be able to call [employer] and get another voucher. I called pt back to let her know and advised her that if she has any trouble to please let us know. Pt gave a verbal understanding.

## 2023-09-03 NOTE — Telephone Encounter (Signed)
 Patient called back to let Shanda Bumps know that she contacted the pharmacy and they will not give her another voucher for the Ozempic. They told her to call back on the 15th, which is too long for her to continue waiting without the medication. Please contact patient

## 2023-09-06 MED ORDER — SEMAGLUTIDE (2 MG/DOSE) 8 MG/3ML ~~LOC~~ SOPN: 2.0000 mg | PEN_INJECTOR | SUBCUTANEOUS | 0 refills | Status: AC

## 2023-09-06 NOTE — Telephone Encounter (Signed)
Medication has been sent and pt is aware.  

## 2023-09-06 NOTE — Addendum Note (Signed)
 Addended by: Sandy Salaam on: 09/06/2023 11:00 AM   Modules accepted: Orders

## 2023-09-06 NOTE — Telephone Encounter (Signed)
 Copied from CRM [id number]. Topic: General - Other >> Sep 06, 2023  8:19 AM Kathryne Eriksson wrote: Reason for CRM: Message To Shanda Bumps >> Sep 06, 2023  8:21 AM Kathryne Eriksson wrote: Patient states she has received her voucher for her Ozempic and is requesting that her prescription is called into the [facility] on [address] in [place].

## 2023-09-12 ENCOUNTER — Other Ambulatory Visit: Payer: Self-pay | Admitting: Internal Medicine

## 2023-09-12 ENCOUNTER — Telehealth: Payer: Self-pay

## 2023-09-12 DIAGNOSIS — E1121 Type 2 diabetes mellitus with diabetic nephropathy: Secondary | ICD-10-CM

## 2023-09-12 NOTE — Telephone Encounter (Signed)
 LMTCB. Please let pt know when she returns call that we have received her patient assistance medication in the office and it is ready for pick up.    Ozempic 2 mg: 4 boxes

## 2023-09-13 ENCOUNTER — Telehealth: Payer: Self-pay

## 2023-09-13 NOTE — Telephone Encounter (Signed)
 Pt came into office and picked up pt assistance medication @ 3:55 ppm on 09/13/23 Ozempic (4 boxes)

## 2023-09-21 ENCOUNTER — Other Ambulatory Visit: Payer: Self-pay | Admitting: Internal Medicine

## 2023-10-28 DIAGNOSIS — E1122 Type 2 diabetes mellitus with diabetic chronic kidney disease: Secondary | ICD-10-CM | POA: Diagnosis not present

## 2023-10-28 DIAGNOSIS — N1832 Chronic kidney disease, stage 3b: Secondary | ICD-10-CM | POA: Diagnosis not present

## 2023-11-04 DIAGNOSIS — I10 Essential (primary) hypertension: Secondary | ICD-10-CM | POA: Diagnosis not present

## 2023-11-04 DIAGNOSIS — E1122 Type 2 diabetes mellitus with diabetic chronic kidney disease: Secondary | ICD-10-CM | POA: Diagnosis not present

## 2023-11-04 DIAGNOSIS — N1831 Chronic kidney disease, stage 3a: Secondary | ICD-10-CM | POA: Diagnosis not present

## 2023-11-04 DIAGNOSIS — R809 Proteinuria, unspecified: Secondary | ICD-10-CM | POA: Diagnosis not present

## 2023-11-04 DIAGNOSIS — N2581 Secondary hyperparathyroidism of renal origin: Secondary | ICD-10-CM | POA: Diagnosis not present

## 2023-11-11 ENCOUNTER — Other Ambulatory Visit: Payer: Self-pay | Admitting: Internal Medicine

## 2023-11-12 ENCOUNTER — Encounter: Payer: Self-pay | Admitting: Internal Medicine

## 2023-11-12 ENCOUNTER — Ambulatory Visit: Payer: Self-pay | Admitting: Internal Medicine

## 2023-11-12 ENCOUNTER — Ambulatory Visit (INDEPENDENT_AMBULATORY_CARE_PROVIDER_SITE_OTHER): Payer: Medicare HMO | Admitting: Internal Medicine

## 2023-11-12 VITALS — BP 120/66 | HR 80 | Ht 65.0 in | Wt 192.0 lb

## 2023-11-12 DIAGNOSIS — N1832 Chronic kidney disease, stage 3b: Secondary | ICD-10-CM | POA: Diagnosis not present

## 2023-11-12 DIAGNOSIS — I8510 Secondary esophageal varices without bleeding: Secondary | ICD-10-CM

## 2023-11-12 DIAGNOSIS — K760 Fatty (change of) liver, not elsewhere classified: Secondary | ICD-10-CM

## 2023-11-12 DIAGNOSIS — E1122 Type 2 diabetes mellitus with diabetic chronic kidney disease: Secondary | ICD-10-CM

## 2023-11-12 DIAGNOSIS — E034 Atrophy of thyroid (acquired): Secondary | ICD-10-CM

## 2023-11-12 DIAGNOSIS — Z7984 Long term (current) use of oral hypoglycemic drugs: Secondary | ICD-10-CM

## 2023-11-12 DIAGNOSIS — Z860100 Personal history of colon polyps, unspecified: Secondary | ICD-10-CM | POA: Diagnosis not present

## 2023-11-12 DIAGNOSIS — D126 Benign neoplasm of colon, unspecified: Secondary | ICD-10-CM

## 2023-11-12 DIAGNOSIS — Z7985 Long-term (current) use of injectable non-insulin antidiabetic drugs: Secondary | ICD-10-CM | POA: Diagnosis not present

## 2023-11-12 DIAGNOSIS — E782 Mixed hyperlipidemia: Secondary | ICD-10-CM | POA: Diagnosis not present

## 2023-11-12 DIAGNOSIS — Z1231 Encounter for screening mammogram for malignant neoplasm of breast: Secondary | ICD-10-CM

## 2023-11-12 DIAGNOSIS — I10 Essential (primary) hypertension: Secondary | ICD-10-CM

## 2023-11-12 LAB — HEMOGLOBIN A1C: Hgb A1c MFr Bld: 6.3 % (ref 4.6–6.5)

## 2023-11-12 LAB — LIPID PANEL
Cholesterol: 143 mg/dL (ref 0–200)
HDL: 38.4 mg/dL — ABNORMAL LOW
LDL Cholesterol: 80 mg/dL (ref 0–99)
NonHDL: 104.34
Total CHOL/HDL Ratio: 4
Triglycerides: 121 mg/dL (ref 0.0–149.0)
VLDL: 24.2 mg/dL (ref 0.0–40.0)

## 2023-11-12 LAB — COMPREHENSIVE METABOLIC PANEL WITH GFR
ALT: 25 U/L (ref 0–35)
AST: 29 U/L (ref 0–37)
Albumin: 4.4 g/dL (ref 3.5–5.2)
Alkaline Phosphatase: 48 U/L (ref 39–117)
BUN: 27 mg/dL — ABNORMAL HIGH (ref 6–23)
CO2: 22 meq/L (ref 19–32)
Calcium: 9.8 mg/dL (ref 8.4–10.5)
Chloride: 103 meq/L (ref 96–112)
Creatinine, Ser: 1.26 mg/dL — ABNORMAL HIGH (ref 0.40–1.20)
GFR: 39.94 mL/min — ABNORMAL LOW
Glucose, Bld: 115 mg/dL — ABNORMAL HIGH (ref 70–99)
Potassium: 4.1 meq/L (ref 3.5–5.1)
Sodium: 138 meq/L (ref 135–145)
Total Bilirubin: 0.6 mg/dL (ref 0.2–1.2)
Total Protein: 8.3 g/dL (ref 6.0–8.3)

## 2023-11-12 LAB — TSH: TSH: 1.54 u[IU]/mL (ref 0.35–5.50)

## 2023-11-12 LAB — LDL CHOLESTEROL, DIRECT: Direct LDL: 83 mg/dL

## 2023-11-12 MED ORDER — ATORVASTATIN CALCIUM 40 MG PO TABS: 40.0000 mg | ORAL_TABLET | Freq: Every day | ORAL | 1 refills | Status: DC

## 2023-11-12 MED ORDER — TELMISARTAN 80 MG PO TABS: 80.0000 mg | ORAL_TABLET | Freq: Every day | ORAL | 1 refills | Status: AC

## 2023-11-12 MED ORDER — METOPROLOL SUCCINATE ER 25 MG PO TB24: 25.0000 mg | ORAL_TABLET | Freq: Every day | ORAL | 1 refills | Status: DC

## 2023-11-12 NOTE — Progress Notes (Signed)
 Subjective:  Patient ID: Marissa Rodriguez, female    DOB: 08-15-1940  Age: 83 y.o. MRN: [id number]  CC: The primary encounter diagnosis was Essential hypertension. Diagnoses of Hypothyroidism due to acquired atrophy of thyroid , Type 2 diabetes mellitus with stage 3b chronic kidney disease, without long-term current use of insulin (HCC), Mixed hyperlipidemia, Encounter for screening mammogram for malignant neoplasm of breast, Secondary esophageal varices without bleeding (HCC), NAFLD (nonalcoholic fatty liver disease), History of colonic polyps, Long-term current use of injectable noninsulin antidiabetic medication, and Tubular adenoma of colon were also pertinent to this visit.   HPI Marissa Rodriguez presents for  Chief Complaint  Patient presents with   Medical Management of Chronic Issues    6 month follow up    Obesity /diabetes:  not losing weight on maximal dose of ozempic ) which she  receives through the PAP program.  Next pickup i in July).   diet reviewed   2 meals daily ,   but nibbles a lot at night : microwave popcorn with butter,  peanut butter crackers,  no sweets   NOT WALKING or doing any form of exercise "because I'm lazy."  Denies orthopedic complaints, dyspnea and chest pain   Hypertension: patient checks blood pressure twice monthly at home.  Readings have been for the most part <120/70 at rest . Patient is following a reduced salt diet most days and is taking medications as prescribed   NASH vs cirrhosis:  taking atenolol for management of esophageal varices . No GI follow up in over 5 years.    Outpatient Medications Prior to Visit  Medication Sig Dispense Refill   amLODipine  (NORVASC ) 5 MG tablet Take 1 tablet by mouth once daily 90 tablet 1   blood glucose meter kit and supplies Dispense based on patient and insurance preference. Use to check blood sugars twice daily. (FOR ICD-10 E11.69). 1 each 0   cholecalciferol (VITAMIN D3) 25 MCG (1000 UNIT) tablet Take 1,000 Units by  mouth daily.     dapagliflozin  propanediol (FARXIGA ) 10 MG TABS tablet Take 1 tablet (10 mg total) by mouth daily before breakfast. 90 tablet 3   Lancets (ONETOUCH DELICA PLUS LANCET33G) MISC USE TO TEST BLOOD SUGAR TWICE DAILY     levothyroxine  (SYNTHROID ) 100 MCG tablet TAKE 1 TABLET BY MOUTH ONCE DAILY BEFORE BREAKFAST 90 tablet 3   ONETOUCH VERIO test strip USE 1 STRIP TO CHECK GLUCOSE THREE TIMES DAILY 200 each 0   Semaglutide , 2 MG/DOSE, 8 MG/3ML SOPN Inject 2 mg as directed once a week. 3 mL 0   atorvastatin  (LIPITOR) 40 MG tablet Take 1 tablet (40 mg total) by mouth daily. 90 tablet 1   metoprolol  succinate (TOPROL -XL) 25 MG 24 hr tablet Take 1 tablet (25 mg total) by mouth at bedtime. 90 tablet 1   telmisartan  (MICARDIS ) 80 MG tablet Take 1 tablet by mouth once daily 90 tablet 0   tirzepatide (MOUNJARO) 10 MG/0.5ML Pen Inject 10 mg into the skin once a week. (Patient not taking: Reported on 11/12/2023) 6 mL 2   No facility-administered medications prior to visit.    Review of Systems;  Patient denies headache, fevers, malaise, unintentional weight loss, skin rash, eye pain, sinus congestion and sinus pain, sore throat, dysphagia,  hemoptysis , cough, dyspnea, wheezing, chest pain, palpitations, orthopnea, edema, abdominal pain, nausea, melena, diarrhea, constipation, flank pain, dysuria, hematuria, urinary  Frequency, nocturia, numbness, tingling, seizures,  Focal weakness, Loss of consciousness,  Tremor, insomnia, depression, anxiety, and  suicidal ideation.      Objective:  BP 120/66   Pulse 80   Ht 5\' 5"  (1.651 m)   Wt 192 lb (87.1 kg)   SpO2 96%   BMI 31.95 kg/m   BP Readings from Last 3 Encounters:  11/12/23 120/66  05/14/23 126/70  04/01/23 126/67    Wt Readings from Last 3 Encounters:  11/12/23 192 lb (87.1 kg)  05/14/23 194 lb 1.9 oz (88.1 kg)  04/01/23 190 lb (86.2 kg)    Physical Exam Vitals reviewed.  Constitutional:      General: She is not in acute  distress.    Appearance: Normal appearance. She is obese. She is not ill-appearing, toxic-appearing or diaphoretic.  HENT:     Head: Normocephalic.  Eyes:     General: No scleral icterus.       Right eye: No discharge.        Left eye: No discharge.     Conjunctiva/sclera: Conjunctivae normal.  Cardiovascular:     Rate and Rhythm: Normal rate and regular rhythm.     Heart sounds: Normal heart sounds.  Pulmonary:     Effort: Pulmonary effort is normal. No respiratory distress.     Breath sounds: Normal breath sounds.  Musculoskeletal:        General: Normal range of motion.     Right lower leg: Edema present.     Left lower leg: Edema present.  Skin:    General: Skin is warm and dry.  Neurological:     General: No focal deficit present.     Mental Status: She is alert and oriented to person, place, and time. Mental status is at baseline.  Psychiatric:        Mood and Affect: Mood normal.        Behavior: Behavior normal.        Thought Content: Thought content normal.        Judgment: Judgment normal.     Lab Results  Component Value Date   HGBA1C 6.3 11/12/2023   HGBA1C 6.3 05/14/2023   HGBA1C 6.1 11/09/2022    Lab Results  Component Value Date   CREATININE 1.26 (H) 11/12/2023   CREATININE 1.15 05/14/2023   CREATININE 1.43 (H) 11/09/2022    Lab Results  Component Value Date   WBC 9.4 02/19/2019   HGB 12.7 02/19/2019   HCT 38.6 02/19/2019   PLT 262.0 02/19/2019   GLUCOSE 115 (H) 11/12/2023   CHOL 143 11/12/2023   TRIG 121.0 11/12/2023   HDL 38.40 (L) 11/12/2023   LDLDIRECT 83.0 11/12/2023   LDLCALC 80 11/12/2023   ALT 25 11/12/2023   AST 29 11/12/2023   NA 138 11/12/2023   K 4.1 11/12/2023   CL 103 11/12/2023   CREATININE 1.26 (H) 11/12/2023   BUN 27 (H) 11/12/2023   CO2 22 11/12/2023   TSH 1.54 11/12/2023   HGBA1C 6.3 11/12/2023   MICROALBUR 1.9 02/27/2023    MM 3D SCREENING MAMMOGRAM BILATERAL BREAST Result Date: 03/21/2023 CLINICAL DATA:   Screening. EXAM: DIGITAL SCREENING BILATERAL MAMMOGRAM WITH TOMOSYNTHESIS AND CAD TECHNIQUE: Bilateral screening digital craniocaudal and mediolateral oblique mammograms were obtained. Bilateral screening digital breast tomosynthesis was performed. The images were evaluated with computer-aided detection. COMPARISON:  Previous exam(s). ACR Breast Density Category c: The breasts are heterogeneously dense, which may obscure small masses. FINDINGS: There are no findings suspicious for malignancy. IMPRESSION: No mammographic evidence of malignancy. A result letter of this screening mammogram will be mailed directly to  the patient. RECOMMENDATION: Screening mammogram in one year. (Code:[id number]-B-01Y) BI-RADS CATEGORY  1: Negative. Electronically Signed   By: Sundra Engel M.D.   On: 03/21/2023 12:10    Assessment & Plan:  .Essential hypertension Assessment & Plan: she reports compliance with 3 drug regimen (amlodipine  metoprolol  and telmisartan  ) .  She is not using NSAIDs daily    she is at goal of 120/70 (due to esophageal varices hx )  .   Renal function, electrolytes and screen for proteinuria have been done by nephrologys ,  GFR has been stable    Lab Results  Component Value Date   CREATININE 1.15 05/14/2023   Lab Results  Component Value Date   NA 137 05/14/2023   K 4.7 05/14/2023   CL 102 05/14/2023   CO2 26 05/14/2023     Orders: -     Comprehensive metabolic panel with GFR  Hypothyroidism due to acquired atrophy of thyroid  Assessment & Plan: Thyroid  function has been  WNL on current levothyroxine   dose. Of 100 mcg    Repeat level ordered   Lab Results  Component Value Date   TSH 0.79 05/14/2023     Orders: -     TSH  Type 2 diabetes mellitus with stage 3b chronic kidney disease, without long-term current use of insulin (HCC) Assessment & Plan: .complicated by proteinuria and  and diabetic retinopathy.  Improved control with ozempic  and Farxiga . .Ozempic  dose is at maximal level at   2.0 mg weekly  and she is receiving both meds frm [facility] pharmacy  .  will attempt to get her Mounjaro .   Continue follow up with Lateef her nephrologist; GFR is stable ; hyperkalemia has resolved. Labs are pending   Lab Results  Component Value Date   HGBA1C 6.3 05/14/2023   Lab Results  Component Value Date   NA 137 05/14/2023   K 4.7 05/14/2023   CL 102 05/14/2023   CO2 26 05/14/2023   Lab Results  Component Value Date   CREATININE 1.15 05/14/2023       Orders: -     Hemoglobin A1c -     Comprehensive metabolic panel with GFR  Mixed hyperlipidemia Assessment & Plan: Continue atorvastatin  for  Goal LDL 70  Given presence of carotid placque on 2012 ultrasound.   Orders: -     Lipid panel -     LDL cholesterol, direct  Encounter for screening mammogram for malignant neoplasm of breast -     3D Screening Mammogram, Left and Right; Future  Secondary esophageal varices without bleeding (HCC) Assessment & Plan: FOUND ON 2011 ADMISSION.  No recent episodes of GI bleed.  Continue metoprolol  for management   Orders: -     Ambulatory referral to Gastroenterology  NAFLD (nonalcoholic fatty liver disease) Assessment & Plan: Suggested by presence of esophageal varices on last EGD in 2010.  Last liver u/s was in 2020 and last AFP as well.   will order annual u/s and AFP and refer to GI   Orders: -     US  ABDOMEN LIMITED RUQ (LIVER/GB); Future -     AFP tumor marker -     Ambulatory referral to Gastroenterology  History of colonic polyps -     Ambulatory referral to Gastroenterology  Long-term current use of injectable noninsulin antidiabetic medication Assessment & Plan: She has not lost weight on maximal dose of ozempic .  Will change to mounjaro if the PAP program will provide medications   Tubular  adenoma of colon Assessment & Plan: She is overdue for 5 yr follow up  Colonoscopy  By 5 years.  Assessment STRONGLY encouraged. And referral made   Other orders -      Atorvastatin  Calcium ; Take 1 tablet (40 mg total) by mouth daily.  Dispense: 90 tablet; Refill: 1 -     Metoprolol  Succinate ER; Take 1 tablet (25 mg total) by mouth at bedtime.  Dispense: 90 tablet; Refill: 1 -     Telmisartan ; Take 1 tablet (80 mg total) by mouth daily.  Dispense: 90 tablet; Refill: 1     I spent 34 minutes on the day of this face to face encounter reviewing patient's  most  recent  labs and imaging studies, counseling on weight management,  reviewing the assessment and plan with patient, and post visit ordering and reviewing of  diagnostics and therapeutics with patient  .   Follow-up: Return in about 3 months (around 02/11/2024) for follow up diabetes.   Thersia Flax, MD

## 2023-11-12 NOTE — Assessment & Plan Note (Signed)
 She is overdue for 5 yr follow up  Colonoscopy  By 5 years.  Assessment STRONGLY encouraged. And referral made

## 2023-11-12 NOTE — Assessment & Plan Note (Signed)
.  complicated by proteinuria and  and diabetic retinopathy.  Improved control with ozempic  and Farxiga . .Ozempic  dose is at maximal level at  2.0 mg weekly  and she is receiving both meds frm [facility] pharmacy  .  will attempt to get her Mounjaro .   Continue follow up with Lateef , her nephrologist; GFR is stable ; hyperkalemia has resolved. Lab Results  Component Value Date   HGBA1C 6.3 11/12/2023   Lab Results  Component Value Date   NA 138 11/12/2023   K 4.1 11/12/2023   CL 103 11/12/2023   CO2 22 11/12/2023   Lab Results  Component Value Date   CREATININE 1.26 (H) 11/12/2023

## 2023-11-12 NOTE — Assessment & Plan Note (Signed)
 She has not lost weight on maximal dose of ozempic .  Will change to mounjaro if the PAP program will provide medications

## 2023-11-12 NOTE — Assessment & Plan Note (Signed)
 FOUND ON 2011 ADMISSION.  No recent episodes of GI bleed.  Continue metoprolol  for management

## 2023-11-12 NOTE — Assessment & Plan Note (Signed)
 Continue atorvastatin  for  Goal LDL 70  Given presence of carotid placque on 2012 ultrasound.

## 2023-11-12 NOTE — Assessment & Plan Note (Signed)
 Thyroid  function has been  WNL on current levothyroxine   dose. Of 100 mcg    Repeat level ordered   Lab Results  Component Value Date   TSH 0.79 05/14/2023

## 2023-11-12 NOTE — Patient Instructions (Signed)
 CONTINUE CURRENT MEDICATion and start your walking program!  Checking to see if PAP program will cover Mounjaro  Abdominal ultrasound needed and GI referral made to [facility]

## 2023-11-12 NOTE — Assessment & Plan Note (Addendum)
 she reports compliance with 3 drug regimen (amlodipine  metoprolol  and telmisartan  ) .  She is not using NSAIDs daily    she is at goal of 120/70 (due to esophageal varices hx )  .   Renal function, electrolytes and screen for proteinuria have been done by nephrologys ,  GFR has been stable    Lab Results  Component Value Date   CREATININE 1.15 05/14/2023   Lab Results  Component Value Date   NA 137 05/14/2023   K 4.7 05/14/2023   CL 102 05/14/2023   CO2 26 05/14/2023

## 2023-11-12 NOTE — Assessment & Plan Note (Addendum)
 Suggested by presence of esophageal varices on last EGD in 2010.  Last liver u/s was in 2020 and last AFP as well.   will order annual u/s and AFP and refer to GI

## 2023-11-12 NOTE — Assessment & Plan Note (Signed)
.  complicated by proteinuria and  and diabetic retinopathy.  Improved control with ozempic  and Farxiga . .Ozempic  dose is at maximal level at  2.0 mg weekly  and she is receiving both meds frm [facility] pharmacy  .  will attempt to get her Mounjaro .   Continue follow up with Lateef her nephrologist; GFR is stable ; hyperkalemia has resolved. Labs are pending   Lab Results  Component Value Date   HGBA1C 6.3 05/14/2023   Lab Results  Component Value Date   NA 137 05/14/2023   K 4.7 05/14/2023   CL 102 05/14/2023   CO2 26 05/14/2023   Lab Results  Component Value Date   CREATININE 1.15 05/14/2023

## 2023-11-14 LAB — AFP TUMOR MARKER: AFP-Tumor Marker: 3 ng/mL

## 2023-11-19 ENCOUNTER — Telehealth: Admitting: Physician Assistant

## 2023-11-19 DIAGNOSIS — J019 Acute sinusitis, unspecified: Secondary | ICD-10-CM | POA: Diagnosis not present

## 2023-11-19 DIAGNOSIS — B9689 Other specified bacterial agents as the cause of diseases classified elsewhere: Secondary | ICD-10-CM

## 2023-11-19 MED ORDER — DOXYCYCLINE HYCLATE 100 MG PO TABS: 100.0000 mg | ORAL_TABLET | Freq: Two times a day (BID) | ORAL | 0 refills | Status: DC

## 2023-11-19 NOTE — Progress Notes (Signed)

## 2023-11-19 NOTE — Progress Notes (Signed)
 I have spent 5 minutes in review of e-visit questionnaire, review and updating patient chart, medical decision making and response to patient.   Piedad Climes, PA-C

## 2023-11-21 ENCOUNTER — Other Ambulatory Visit: Payer: Self-pay | Admitting: Internal Medicine

## 2023-11-21 DIAGNOSIS — E1121 Type 2 diabetes mellitus with diabetic nephropathy: Secondary | ICD-10-CM

## 2023-12-21 ENCOUNTER — Telehealth: Admitting: Nurse Practitioner

## 2023-12-21 DIAGNOSIS — R6889 Other general symptoms and signs: Secondary | ICD-10-CM | POA: Diagnosis not present

## 2023-12-21 MED ORDER — BENZONATATE 100 MG PO CAPS: 100.0000 mg | ORAL_CAPSULE | Freq: Two times a day (BID) | ORAL | 0 refills | Status: DC | PRN

## 2023-12-21 NOTE — Progress Notes (Signed)
 I have spent 5 minutes in review of e-visit questionnaire, review and updating patient chart, medical decision making and response to patient.   Claiborne Rigg, NP

## 2023-12-21 NOTE — Progress Notes (Signed)
 We do not prescribe tamiflu unless you have a positive over the counter flu test or covid test over the counter.  E visit for Flu symptoms   We are sorry that you are not feeling well.  Here is how we plan to help! Based on what you have shared with me it looks like you may have flu-like symptoms that should be watched but do not seem to indicate anti-viral treatment.  Influenza or "the flu" is   an infection caused by a respiratory virus. The flu virus is highly contagious and persons who did not receive their yearly flu vaccination may "catch" the flu from close contact.  We have anti-viral medications to treat the viruses that cause this infection. They are not a "cure" and only shorten the course of the infection. These prescriptions are most effective when they are given within the first 2 days of "flu" symptoms. Antiviral medication is indicated if you have a high risk of complications from the flu. You should also consider an antiviral medication if you are in close contact with someone who is at risk. These medications can help patients avoid complications from the flu but have side effects that you should know. Possible side effects from Tamiflu or oseltamivir include nausea, vomiting, diarrhea, dizziness, headaches, eye redness, sleep problems or other respiratory symptoms.  You should not take Tamiflu if you have an allergy to oseltamivir or any to the ingredients in Tamiflu.  Based upon your symptoms and potential risk factors I recommend you take an over the counter flu and covid test and if positive please schedule a video visit.   You are to isolate at home until you have been fever-free for at least 24 hours without a fever-reducing medication, and symptoms have been steadily improving for 24 hours. At that time, you can end isolation but need to mask for an additional 5 days.  If you must be around other household members who do not have symptoms, you need to make sure that both you  and the family members are masking consistently with a high-quality mask.  If you note any worsening of symptoms despite treatment, please seek an in-person evaluation ASAP. If you note any significant shortness of breath or any chest pain, please seek ED evaluation. Please do not delay care!  Go to the nearest hospital ED for assessment if fever/cough/breathlessness are severe or illness seems like a threat to life.    The following symptoms may appear 2-14 days after exposure: Fever Cough Shortness of breath or difficulty breathing Chills Repeated shaking with chills Muscle pain Headache Sore throat New loss of taste or smell Fatigue Congestion or runny nose Nausea or vomiting Diarrhea  You can use medication such as I have prescribed Tessalon  Perles 100 mg. You may take 1-2 capsules every 8 hours as needed for cough  For nasal congestion, you may use an oral decongestant such as Mucinex  D or if you have glaucoma or high blood pressure use plain Mucinex .  Saline nasal spray or nasal drops can help and can safely be used as often as needed for congestion.    If you have a sore or scratchy throat, use a saltwater gargle-  to  teaspoon of salt dissolved in a 4-ounce to 8-ounce glass of warm water .  Gargle the solution for approximately 15-30 seconds and then spit.  It is important not to swallow the solution.  You can also use throat lozenges/cough drops and Chloraseptic spray to help with throat pain  or discomfort.  Warm or cold liquids can also be helpful in relieving throat pain.  For headache, pain or general discomfort, you can use Ibuprofen or Tylenol  as directed.   Some authorities believe that zinc sprays or the use of Echinacea may shorten the course of your symptoms.  ANYONE WHO HAS FLU SYMPTOMS SHOULD:  Stay home. The flu is highly contagious and going out or to work exposes others!  Be sure to drink plenty of fluids. Water  is fine as well as fruit juices, sodas and  electrolyte beverages. You may want to stay       away from caffeine or alcohol. If you are nauseated, try taking small sips of liquids. How do you know if you are getting enough fluid? Your urine should be a pale yellow or almost colorless.  Get rest.  Taking a steamy shower or using a humidifier may help nasal congestion and ease sore throat pain. Using a saline nasal spray works much the same way.  Cough drops, hard candies and sore throat lozenges may ease your cough.  Line up a caregiver. Have someone check on you regularly.  GET HELP RIGHT AWAY IF YOU HAVE EMERGENCY WARNING SIGNS - CALL 911 or proceed to your closest emergency facility if:  You develop worsening high fever. Trouble breathing Bluish lips or face Persistent pain or pressure in the chest New confusion Inability to wake or stay awake You cough up blood. Your symptoms become more severe Inability to hold down food or fluids  MAKE SURE YOU Understand these instructions. Will watch your condition. Will get help right away if you are not doing well or get worse.  Your e-visit answers were reviewed by a board certified advanced clinical practitioner to complete your personal care plan.  Depending on the condition, your plan could have included both over the counter or prescription medications. If there is a problem, please reply once you have received a response from your provider. Your safety is important to us .  If you have drug allergies, check your prescription carefully.   You can use MyChart to ask questions about today's visit, request a non-urgent call back, or ask for a work or school excuse for 24 hours related to this e-Visit. If it has been greater than 24 hours you will need to follow up with your provider or enter a new e-Visit to address those concerns. You will get an e-mail in the next two days asking about your experience.  I hope that your e-visit has been valuable and will speed up your  recovery. Thank you for using E-visits!

## 2023-12-23 ENCOUNTER — Telehealth: Payer: Self-pay

## 2023-12-23 NOTE — Telephone Encounter (Signed)
Spoke with pt to let her know that we have received her patient assistance medication and it is ready for pick up.   Ozempic: 4 boxes

## 2024-01-24 DIAGNOSIS — E113292 Type 2 diabetes mellitus with mild nonproliferative diabetic retinopathy without macular edema, left eye: Secondary | ICD-10-CM | POA: Diagnosis not present

## 2024-01-24 DIAGNOSIS — E113551 Type 2 diabetes mellitus with stable proliferative diabetic retinopathy, right eye: Secondary | ICD-10-CM | POA: Diagnosis not present

## 2024-01-24 DIAGNOSIS — Z961 Presence of intraocular lens: Secondary | ICD-10-CM | POA: Diagnosis not present

## 2024-01-24 DIAGNOSIS — H40053 Ocular hypertension, bilateral: Secondary | ICD-10-CM | POA: Diagnosis not present

## 2024-01-24 LAB — HM DIABETES EYE EXAM

## 2024-01-25 ENCOUNTER — Other Ambulatory Visit: Payer: Self-pay | Admitting: Internal Medicine

## 2024-01-25 DIAGNOSIS — E1121 Type 2 diabetes mellitus with diabetic nephropathy: Secondary | ICD-10-CM

## 2024-01-28 ENCOUNTER — Encounter: Payer: Self-pay | Admitting: Internal Medicine

## 2024-02-13 ENCOUNTER — Encounter: Payer: Self-pay | Admitting: Internal Medicine

## 2024-02-13 ENCOUNTER — Ambulatory Visit: Admitting: Internal Medicine

## 2024-02-13 VITALS — BP 132/66 | HR 84 | Ht 65.0 in | Wt 189.2 lb

## 2024-02-13 DIAGNOSIS — E1122 Type 2 diabetes mellitus with diabetic chronic kidney disease: Secondary | ICD-10-CM | POA: Diagnosis not present

## 2024-02-13 DIAGNOSIS — E669 Obesity, unspecified: Secondary | ICD-10-CM

## 2024-02-13 DIAGNOSIS — Z0000 Encounter for general adult medical examination without abnormal findings: Secondary | ICD-10-CM | POA: Diagnosis not present

## 2024-02-13 DIAGNOSIS — I10 Essential (primary) hypertension: Secondary | ICD-10-CM

## 2024-02-13 DIAGNOSIS — Z7985 Long-term (current) use of injectable non-insulin antidiabetic drugs: Secondary | ICD-10-CM

## 2024-02-13 DIAGNOSIS — E034 Atrophy of thyroid (acquired): Secondary | ICD-10-CM

## 2024-02-13 DIAGNOSIS — K7469 Other cirrhosis of liver: Secondary | ICD-10-CM

## 2024-02-13 DIAGNOSIS — K7581 Nonalcoholic steatohepatitis (NASH): Secondary | ICD-10-CM | POA: Diagnosis not present

## 2024-02-13 DIAGNOSIS — N1832 Chronic kidney disease, stage 3b: Secondary | ICD-10-CM | POA: Diagnosis not present

## 2024-02-13 DIAGNOSIS — E782 Mixed hyperlipidemia: Secondary | ICD-10-CM

## 2024-02-13 DIAGNOSIS — K7460 Unspecified cirrhosis of liver: Secondary | ICD-10-CM

## 2024-02-13 DIAGNOSIS — Z6830 Body mass index (BMI) 30.0-30.9, adult: Secondary | ICD-10-CM | POA: Diagnosis not present

## 2024-02-13 DIAGNOSIS — E119 Type 2 diabetes mellitus without complications: Secondary | ICD-10-CM

## 2024-02-13 LAB — MICROALBUMIN / CREATININE URINE RATIO
Creatinine,U: 106.6 mg/dL
Microalb Creat Ratio: 35 mg/g — ABNORMAL HIGH (ref 0.0–30.0)
Microalb, Ur: 3.7 mg/dL — ABNORMAL HIGH (ref 0.0–1.9)

## 2024-02-13 LAB — LIPID PANEL
Cholesterol: 135 mg/dL (ref 0–200)
HDL: 42.1 mg/dL
LDL Cholesterol: 65 mg/dL (ref 0–99)
NonHDL: 92.9
Total CHOL/HDL Ratio: 3
Triglycerides: 141 mg/dL (ref 0.0–149.0)
VLDL: 28.2 mg/dL (ref 0.0–40.0)

## 2024-02-13 LAB — COMPREHENSIVE METABOLIC PANEL WITH GFR
ALT: 22 U/L (ref 0–35)
AST: 27 U/L (ref 0–37)
Albumin: 4.4 g/dL (ref 3.5–5.2)
Alkaline Phosphatase: 41 U/L (ref 39–117)
BUN: 21 mg/dL (ref 6–23)
CO2: 26 meq/L (ref 19–32)
Calcium: 9.7 mg/dL (ref 8.4–10.5)
Chloride: 104 meq/L (ref 96–112)
Creatinine, Ser: 1.23 mg/dL — ABNORMAL HIGH (ref 0.40–1.20)
GFR: 41.04 mL/min — ABNORMAL LOW
Glucose, Bld: 114 mg/dL — ABNORMAL HIGH (ref 70–99)
Potassium: 4.7 meq/L (ref 3.5–5.1)
Sodium: 139 meq/L (ref 135–145)
Total Bilirubin: 0.6 mg/dL (ref 0.2–1.2)
Total Protein: 7.8 g/dL (ref 6.0–8.3)

## 2024-02-13 LAB — HEMOGLOBIN A1C: Hgb A1c MFr Bld: 6.1 % (ref 4.6–6.5)

## 2024-02-13 LAB — LDL CHOLESTEROL, DIRECT: Direct LDL: 72 mg/dL

## 2024-02-13 MED ORDER — AMLODIPINE BESYLATE 5 MG PO TABS: 5.0000 mg | ORAL_TABLET | Freq: Every day | ORAL | 1 refills | Status: AC

## 2024-02-13 NOTE — Progress Notes (Deleted)
 Subjective:  Patient ID: Marissa Rodriguez, female    DOB: 05-04-1941  Age: 83 y.o. MRN: [id number]  CC: The primary encounter diagnosis was Essential hypertension. Diagnoses of Type 2 diabetes mellitus with stage 3b chronic kidney disease, without long-term current use of insulin (HCC) and Mixed hyperlipidemia were also pertinent to this visit.   HPI Marissa Rodriguez presents for  Chief Complaint  Patient presents with   Medical Management of Chronic Issues    3 month follow up    1)  TYPE 2 DM WITH OBESITY:   She  feels generally well,  But is not  exercising regularly or trying to lose weight. Checking  blood sugars less than once daily at variable times, usually only if she feels she may be having a hypoglycemic event. .  BS have been under 130 fasting and < 150 post prandially.  Denies any recent hypoglyemic events.  Taking   medications as directed. Following a carbohydrate modified diet 6 days per week. Denies numbness, burning and tingling of extremities. Appetite is good.    2) Hypertension: patient checks blood pressure ONCE A MONTH  at home.  Readings have been for the most part <130/80 at rest . Patient is following a reduced salt diet most days and is taking medications as prescribed    Outpatient Medications Prior to Visit  Medication Sig Dispense Refill   amLODipine  (NORVASC ) 5 MG tablet Take 1 tablet by mouth once daily 90 tablet 1   atorvastatin  (LIPITOR) 40 MG tablet Take 1 tablet (40 mg total) by mouth daily. 90 tablet 1   blood glucose meter kit and supplies Dispense based on patient and insurance preference. Use to check blood sugars twice daily. (FOR ICD-10 E11.69). 1 each 0   cholecalciferol (VITAMIN D3) 25 MCG (1000 UNIT) tablet Take 1,000 Units by mouth daily.     dapagliflozin  propanediol (FARXIGA ) 10 MG TABS tablet Take 1 tablet (10 mg total) by mouth daily before breakfast. 90 tablet 3   Lancets (ONETOUCH DELICA PLUS LANCET33G) MISC USE TO TEST BLOOD SUGAR TWICE DAILY      levothyroxine  (SYNTHROID ) 100 MCG tablet TAKE 1 TABLET BY MOUTH ONCE DAILY BEFORE BREAKFAST 90 tablet 3   metoprolol  succinate (TOPROL -XL) 25 MG 24 hr tablet Take 1 tablet (25 mg total) by mouth at bedtime. 90 tablet 1   ONETOUCH VERIO test strip USE 1 STRIP TO CHECK GLUCOSE THREE TIMES DAILY 200 each 0   Semaglutide , 2 MG/DOSE, 8 MG/3ML SOPN Inject 2 mg as directed once a week. 3 mL 0   telmisartan  (MICARDIS ) 80 MG tablet Take 1 tablet (80 mg total) by mouth daily. 90 tablet 1   benzonatate  (TESSALON ) 100 MG capsule Take 1 capsule (100 mg total) by mouth 2 (two) times daily as needed for cough. (Patient not taking: Reported on 02/13/2024) 20 capsule 0   doxycycline  (VIBRA -TABS) 100 MG tablet Take 1 tablet (100 mg total) by mouth 2 (two) times daily. 20 tablet 0   No facility-administered medications prior to visit.    Review of Systems;  Patient denies headache, fevers, malaise, unintentional weight loss, skin rash, eye pain, sinus congestion and sinus pain, sore throat, dysphagia,  hemoptysis , cough, dyspnea, wheezing, chest pain, palpitations, orthopnea, edema, abdominal pain, nausea, melena, diarrhea, constipation, flank pain, dysuria, hematuria, urinary  Frequency, nocturia, numbness, tingling, seizures,  Focal weakness, Loss of consciousness,  Tremor, insomnia, depression, anxiety, and suicidal ideation.      Objective:  BP 132/66  Pulse 84   Ht 5' 5 (1.651 m)   Wt 189 lb 3.2 oz (85.8 kg)   SpO2 96%   BMI 31.48 kg/m   BP Readings from Last 3 Encounters:  02/13/24 132/66  11/12/23 120/66  05/14/23 126/70    Wt Readings from Last 3 Encounters:  02/13/24 189 lb 3.2 oz (85.8 kg)  11/12/23 192 lb (87.1 kg)  05/14/23 194 lb 1.9 oz (88.1 kg)    Physical Exam  Lab Results  Component Value Date   HGBA1C 6.3 11/12/2023   HGBA1C 6.3 05/14/2023   HGBA1C 6.1 11/09/2022    Lab Results  Component Value Date   CREATININE 1.26 (H) 11/12/2023   CREATININE 1.15 05/14/2023    CREATININE 1.43 (H) 11/09/2022    Lab Results  Component Value Date   WBC 9.4 02/19/2019   HGB 12.7 02/19/2019   HCT 38.6 02/19/2019   PLT 262.0 02/19/2019   GLUCOSE 115 (H) 11/12/2023   CHOL 143 11/12/2023   TRIG 121.0 11/12/2023   HDL 38.40 (L) 11/12/2023   LDLDIRECT 83.0 11/12/2023   LDLCALC 80 11/12/2023   ALT 25 11/12/2023   AST 29 11/12/2023   NA 138 11/12/2023   K 4.1 11/12/2023   CL 103 11/12/2023   CREATININE 1.26 (H) 11/12/2023   BUN 27 (H) 11/12/2023   CO2 22 11/12/2023   TSH 1.54 11/12/2023   HGBA1C 6.3 11/12/2023   MICROALBUR 1.9 02/27/2023    MM 3D SCREENING MAMMOGRAM BILATERAL BREAST Result Date: 03/21/2023 CLINICAL DATA:  Screening. EXAM: DIGITAL SCREENING BILATERAL MAMMOGRAM WITH TOMOSYNTHESIS AND CAD TECHNIQUE: Bilateral screening digital craniocaudal and mediolateral oblique mammograms were obtained. Bilateral screening digital breast tomosynthesis was performed. The images were evaluated with computer-aided detection. COMPARISON:  Previous exam(s). ACR Breast Density Category c: The breasts are heterogeneously dense, which may obscure small masses. FINDINGS: There are no findings suspicious for malignancy. IMPRESSION: No mammographic evidence of malignancy. A result letter of this screening mammogram will be mailed directly to the patient. RECOMMENDATION: Screening mammogram in one year. (Code:[id number]) BI-RADS CATEGORY  1: Negative. Electronically Signed   By: Reyes Phi M.D.   On: 03/21/2023 12:10    Assessment & Plan:  .Essential hypertension  Type 2 diabetes mellitus with stage 3b chronic kidney disease, without long-term current use of insulin (HCC)  Mixed hyperlipidemia     I spent 34 minutes on the day of this face to face encounter reviewing patient's  most recent visit with cardiology,  nephrology,  and neurology,  prior relevant surgical and non surgical procedures, recent  labs and imaging studies, counseling on weight management,   reviewing the assessment and plan with patient, and post visit ordering and reviewing of  diagnostics and therapeutics with patient  .   Follow-up: No follow-ups on file.   Verneita LITTIE Kettering, MD

## 2024-02-13 NOTE — Patient Instructions (Addendum)
 PLEASE  CALL THE RADIOLOGY DEPT AND  GET YOUR ULTRASOUND SCHEDULED IN THE NEXT SEVERAL MONTHS.   # [phone] option 4 and then option 2  CALL TO SCHEDULE YOUR MAMMOGRAM ASAP.  THERE WAS A 3 MONTH WAIT WHEN I CALLED AND YOU ARE DUE ON SEPT 26

## 2024-02-15 ENCOUNTER — Ambulatory Visit: Payer: Self-pay | Admitting: Internal Medicine

## 2024-02-15 DIAGNOSIS — E119 Type 2 diabetes mellitus without complications: Secondary | ICD-10-CM | POA: Insufficient documentation

## 2024-02-15 NOTE — Assessment & Plan Note (Signed)

## 2024-02-15 NOTE — Assessment & Plan Note (Signed)
 Weight loss has stopped on maximal dose of ozempic .  Not exercising,  encouaged to start walking program .

## 2024-02-15 NOTE — Assessment & Plan Note (Addendum)
 Suggested by presence of esophageal varices on last EGD in 2010.  Last liver u/s was in 2020 , AGFP was normal ain May 2025.   Stressed the importance of semi annual monitoring for hepatocellular CA with  ultrasound, which has been ordered

## 2024-02-15 NOTE — Assessment & Plan Note (Signed)
 Thyroid  function has been  WNL on current levothyroxine   dose. Of 100 mcg     Lab Results  Component Value Date   TSH 1.54 11/12/2023

## 2024-02-15 NOTE — Assessment & Plan Note (Signed)
 Well controlled,  complicated by obesity, proteinuria and  and diabetic retinopathy.  Improved control with ozempic  and Farxiga . .Ozempic  dose is at maximal level at  2.0 mg weekly  and she is receiving both meds frm [facility]    Continue follow up with Lateef , her nephrologist; GFR is stable ; hyperkalemia has resolved. Lab Results  Component Value Date   HGBA1C 6.1 02/13/2024   Lab Results  Component Value Date   NA 139 02/13/2024   K 4.7 02/13/2024   CL 104 02/13/2024   CO2 26 02/13/2024   Lab Results  Component Value Date   CREATININE 1.23 (H) 02/13/2024

## 2024-02-15 NOTE — Progress Notes (Signed)
 Patient ID: Marissa Rodriguez, female    DOB: [birth year]  Age: 83 y.o. MRN: [id number]  The patient is here for annual preventive  examination and management of other chronic and acute problems.   The risk factors are reflected in the social history.  The roster of all physicians providing medical care to patient - is listed in the Snapshot section of the chart.  Activities of daily living:  The patient is 100% independent in all ADLs: dressing, toileting, feeding as well as independent mobility  Home safety : The patient has smoke detectors in the home. They wear seatbelts.  There are no firearms at home. There is no violence in the home.   There is no risks for hepatitis, STDs or HIV. There is no   history of blood transfusion. They have no travel history to infectious disease endemic areas of the world.  The patient has seen their dentist in the last six month. They have seen their eye doctor in the last year. They admit to slight hearing difficulty with regard to whispered voices and some television programs.  They have deferred audiologic testing in the last year.  They do not  have excessive sun exposure. Discussed the need for sun protection: hats, long sleeves and use of sunscreen if there is significant sun exposure.   Diet: the importance of a healthy diet is discussed.   She is not restricting her carbohyodrates   The benefits of regular aerobic exercise were discussed. She walks occasionally for exercise,  1-2 times per week ,  20 minutes.   Depression screen: there are no signs or vegative symptoms of depression- irritability, change in appetite, anhedonia, sadness/tearfullness. She has been reconnecting with childhood friends and has had several enjoyable trips since her last visit .   Cognitive assessment: the patient manages all their financial and personal affairs and is actively engaged. They could relate day,date,year and events; recalled 2/3 objects at 3 minutes; performed  clock-face test normally.  The following portions of the patient's history were reviewed and updated as appropriate: allergies, current medications, past family history, past medical history,  past surgical history, past social history  and problem list.  Visual acuity was not assessed per patient preference since she has regular follow up with her ophthalmologist. Hearing and body mass index were assessed and reviewed.   During the course of the visit the patient was educated and counseled about appropriate screening and preventive services including : fall prevention , diabetes screening, nutrition counseling, colorectal cancer screening, and recommended immunizations.    CC: The primary encounter diagnosis was Essential hypertension. Diagnoses of Type 2 diabetes mellitus with stage 3b chronic kidney disease, without long-term current use of insulin (HCC), Mixed hyperlipidemia, Hypothyroidism due to acquired atrophy of thyroid , Encounter for preventive health examination, Liver cirrhosis secondary to NASH (nonalcoholic steatohepatitis) (HCC), Obesity (BMI 30-39.9), and Diabetes mellitus treated with injections of non-insulin medication (HCC) were also pertinent to this visit.  1)  TYPE 2 DM WITH OBESITY:   She  feels generally well,  But is not  exercising regularly or trying to lose weight. Checking  blood sugars less than once daily at variable times, usually only if she feels she may be having a hypoglycemic event. .  BS have been under 130 fasting and < 150 post prandially.  Denies any recent hypoglyemic events.  Taking   medications as directed (ozempic , Farxigaboth  supplied by [facility] ) Diet has not changed much since may visit despite counselling.  Denies numbness, burning and tingling of extremities. Appetite is good.    2) Hypertension: patient checks blood pressure ONCE A MONTH  at home.  Readings have been for the most part <130/80 at rest . Patient is following a reduced salt diet most days  and is taking medications as prescribed   3) Nonalcoholic cirrhosis :  she has not had GI follow up for several years.  Last abdominal u/s was in 2020 and was reordered but patient  has deferred due to increased travel plans.     History Marissa Rodriguez has a past medical history of Closed fracture of part of upper end of humerus (09/24/2015), Diabetes mellitus without complication (HCC), History of MRI of brain and brain stem (10/2010), Hyperkalemia (01/16/2021), Hyperlipidemia, Hypertension, Hypothyroidism, and Tubular adenoma of colon (04/2009).   She has a past surgical history that includes Abdominal hysterectomy (1975); Appendectomy (1975); Colonoscopy with propofol  (N/A, 03/19/2019); and polypectomy (N/A, 03/19/2019).   Her family history includes Breast cancer (age of onset: 31) in her sister; Cancer in her father.She reports that she has never smoked. She has never used smokeless tobacco. She reports that she does not drink alcohol and does not use drugs.  Outpatient Medications Prior to Visit  Medication Sig Dispense Refill   atorvastatin  (LIPITOR) 40 MG tablet Take 1 tablet (40 mg total) by mouth daily. 90 tablet 1   blood glucose meter kit and supplies Dispense based on patient and insurance preference. Use to check blood sugars twice daily. (FOR ICD-10 E11.69). 1 each 0   cholecalciferol (VITAMIN D3) 25 MCG (1000 UNIT) tablet Take 1,000 Units by mouth daily.     dapagliflozin  propanediol (FARXIGA ) 10 MG TABS tablet Take 1 tablet (10 mg total) by mouth daily before breakfast. 90 tablet 3   Lancets (ONETOUCH DELICA PLUS LANCET33G) MISC USE TO TEST BLOOD SUGAR TWICE DAILY     levothyroxine  (SYNTHROID ) 100 MCG tablet TAKE 1 TABLET BY MOUTH ONCE DAILY BEFORE BREAKFAST 90 tablet 3   metoprolol  succinate (TOPROL -XL) 25 MG 24 hr tablet Take 1 tablet (25 mg total) by mouth at bedtime. 90 tablet 1   ONETOUCH VERIO test strip USE 1 STRIP TO CHECK GLUCOSE THREE TIMES DAILY 200 each 0   Semaglutide , 2  MG/DOSE, 8 MG/3ML SOPN Inject 2 mg as directed once a week. 3 mL 0   telmisartan  (MICARDIS ) 80 MG tablet Take 1 tablet (80 mg total) by mouth daily. 90 tablet 1   amLODipine  (NORVASC ) 5 MG tablet Take 1 tablet by mouth once daily 90 tablet 1   benzonatate  (TESSALON ) 100 MG capsule Take 1 capsule (100 mg total) by mouth 2 (two) times daily as needed for cough. (Patient not taking: Reported on 02/13/2024) 20 capsule 0   doxycycline  (VIBRA -TABS) 100 MG tablet Take 1 tablet (100 mg total) by mouth 2 (two) times daily. 20 tablet 0   No facility-administered medications prior to visit.    Review of Systems  Patient denies headache, fevers, malaise, unintentional weight loss, skin rash, eye pain, sinus congestion and sinus pain, sore throat, dysphagia,  hemoptysis , cough, dyspnea, wheezing, chest pain, palpitations, orthopnea, edema, abdominal pain, nausea, melena, diarrhea, constipation, flank pain, dysuria, hematuria, urinary  Frequency, nocturia, numbness, tingling, seizures,  Focal weakness, Loss of consciousness,  Tremor, insomnia, depression, anxiety, and suicidal ideation.     Objective:  BP 132/66   Pulse 84   Ht 5' 5 (1.651 m)   Wt 189 lb 3.2 oz (85.8 kg)   SpO2 96%  BMI 31.48 kg/m   Physical Exam Vitals reviewed.  Constitutional:      General: She is not in acute distress.    Appearance: Normal appearance. She is normal weight. She is not ill-appearing, toxic-appearing or diaphoretic.  HENT:     Head: Normocephalic.  Eyes:     General: No scleral icterus.       Right eye: No discharge.        Left eye: No discharge.     Conjunctiva/sclera: Conjunctivae normal.  Cardiovascular:     Rate and Rhythm: Normal rate and regular rhythm.     Heart sounds: Normal heart sounds.  Pulmonary:     Effort: Pulmonary effort is normal. No respiratory distress.     Breath sounds: Normal breath sounds.  Musculoskeletal:        General: Normal range of motion.  Skin:    General: Skin is  warm and dry.  Neurological:     General: No focal deficit present.     Mental Status: She is alert and oriented to person, place, and time. Mental status is at baseline.  Psychiatric:        Mood and Affect: Mood normal.        Behavior: Behavior normal.        Thought Content: Thought content normal.        Judgment: Judgment normal.      Assessment & Plan:  Essential hypertension Assessment & Plan: she reports compliance with 3 drug regimen (amlodipine  metoprolol  and telmisartan  ) .  She is not using NSAIDs daily    she is at goal of 120/70 (due to esophageal varices hx )  SHE has CKD stage 3a ;   Renal function, electrolytes and screen for proteinuria have been done by nephrologys ,  GFR has been stable    Lab Results  Component Value Date   CREATININE 1.23 (H) 02/13/2024   Lab Results  Component Value Date   NA 139 02/13/2024   K 4.7 02/13/2024   CL 104 02/13/2024   CO2 26 02/13/2024     Orders: -     Comprehensive metabolic panel with GFR -     Microalbumin / creatinine urine ratio  Type 2 diabetes mellitus with stage 3b chronic kidney disease, without long-term current use of insulin (HCC) Assessment & Plan:  Well controlled,  complicated by obesity, proteinuria and  and diabetic retinopathy.  Improved control with ozempic  and Farxiga . .Ozempic  dose is at maximal level at  2.0 mg weekly  and she is receiving both meds frm [facility] pharmacy    Continue follow up with Lateef , her nephrologist; GFR is stable ; hyperkalemia has resolved. Lab Results  Component Value Date   HGBA1C 6.1 02/13/2024   Lab Results  Component Value Date   NA 139 02/13/2024   K 4.7 02/13/2024   CL 104 02/13/2024   CO2 26 02/13/2024   Lab Results  Component Value Date   CREATININE 1.23 (H) 02/13/2024       Orders: -     Hemoglobin A1c -     Comprehensive metabolic panel with GFR -     Microalbumin / creatinine urine ratio -     Hemoglobin A1c; Future -     Comprehensive metabolic  panel with GFR; Future  Mixed hyperlipidemia -     Lipid panel -     LDL cholesterol, direct -     Lipid panel; Future  Hypothyroidism due to acquired atrophy of  thyroid  Assessment & Plan: Thyroid  function has been  WNL on current levothyroxine   dose. Of 100 mcg     Lab Results  Component Value Date   TSH 1.54 11/12/2023     Orders: -     TSH; Future  Encounter for preventive health examination Assessment & Plan: age appropriate education and counseling updated, referrals for preventative services and immunizations addressed, dietary and smoking counseling addressed, most recent labs reviewed.  I have personally reviewed and have noted:   1) the patient's medical and social history 2) The pt's use of alcohol, tobacco, and illicit drugs 3) The patient's current medications and supplements 4) Functional ability including ADL's, fall risk, home safety risk, hearing and visual impairment 5) Diet and physical activities 6) Evidence for depression or mood disorder 7) The patient's height, weight, and BMI have been recorded in the chart    I have made referrals, and provided counseling and education based on review of the above    Liver cirrhosis secondary to NASH (nonalcoholic steatohepatitis) (HCC) Assessment & Plan: Suggested by presence of esophageal varices on last EGD in 2010.  Last liver u/s was in 2020 , AGFP was normal ain May 2025.   Stressed the importance of semi annual monitoring for hepatocellular CA with  ultrasound, which has been ordered    Obesity (BMI 30-39.9) Assessment & Plan: Weight loss has stopped on maximal dose of ozempic .  Not exercising,  encouaged to start walking program .   Diabetes mellitus treated with injections of non-insulin medication (HCC) Assessment & Plan: Continue maximal dose of Ozempic    Other orders -     amLODIPine  Besylate; Take 1 tablet (5 mg total) by mouth daily.  Dispense: 90 tablet; Refill: 1      I provided 40  minutes of  face-to-face time during this encounter reviewing patient's current problems and past surgeries,  recent labs and imaging studies, providing counseling on the above mentioned problems , and coordination  of care .   Follow-up: Return in about 6 months (around 08/15/2024) for follow up diabetes.   Verneita LITTIE Kettering, MD

## 2024-02-15 NOTE — Assessment & Plan Note (Signed)
 Continue maximal dose of Ozempic 

## 2024-02-15 NOTE — Assessment & Plan Note (Signed)
 she reports compliance with 3 drug regimen (amlodipine  metoprolol  and telmisartan  ) .  She is not using NSAIDs daily    she is at goal of 120/70 (due to esophageal varices hx )  SHE has CKD stage 3a ;   Renal function, electrolytes and screen for proteinuria have been done by nephrologys ,  GFR has been stable    Lab Results  Component Value Date   CREATININE 1.23 (H) 02/13/2024   Lab Results  Component Value Date   NA 139 02/13/2024   K 4.7 02/13/2024   CL 104 02/13/2024   CO2 26 02/13/2024

## 2024-02-17 NOTE — Telephone Encounter (Signed)
 Copied from CRM #[id number]. Topic: Clinical - Lab/Test Results >> Feb 17, 2024 11:28 AM Corin V wrote: Reason for CRM: Patient called to get lab results. Read provider note verbatim. Patient verbalized understanding and has no additional questions or concerns at this time. Gave ultrasound number: [phone] option 4 and then 2.

## 2024-03-02 DIAGNOSIS — I10 Essential (primary) hypertension: Secondary | ICD-10-CM | POA: Diagnosis not present

## 2024-03-02 DIAGNOSIS — N1831 Chronic kidney disease, stage 3a: Secondary | ICD-10-CM | POA: Diagnosis not present

## 2024-03-02 DIAGNOSIS — E1122 Type 2 diabetes mellitus with diabetic chronic kidney disease: Secondary | ICD-10-CM | POA: Diagnosis not present

## 2024-03-05 ENCOUNTER — Telehealth: Payer: Self-pay

## 2024-03-05 NOTE — Telephone Encounter (Signed)
 Completed refill form for Ozempic  (novo Nordisk) and faxed to providers office for review and signature.

## 2024-03-10 DIAGNOSIS — I10 Essential (primary) hypertension: Secondary | ICD-10-CM | POA: Diagnosis not present

## 2024-03-10 DIAGNOSIS — R809 Proteinuria, unspecified: Secondary | ICD-10-CM | POA: Diagnosis not present

## 2024-03-10 DIAGNOSIS — N1832 Chronic kidney disease, stage 3b: Secondary | ICD-10-CM | POA: Diagnosis not present

## 2024-03-10 DIAGNOSIS — E1122 Type 2 diabetes mellitus with diabetic chronic kidney disease: Secondary | ICD-10-CM | POA: Diagnosis not present

## 2024-03-10 DIAGNOSIS — N2581 Secondary hyperparathyroidism of renal origin: Secondary | ICD-10-CM | POA: Diagnosis not present

## 2024-03-20 ENCOUNTER — Ambulatory Visit
Admission: RE | Admit: 2024-03-20 | Discharge: 2024-03-20 | Disposition: A | Discharge status: Home / Self Care | Source: Ambulatory Visit | Attending: Internal Medicine | Admitting: Internal Medicine

## 2024-03-20 DIAGNOSIS — Z1231 Encounter for screening mammogram for malignant neoplasm of breast: Secondary | ICD-10-CM | POA: Insufficient documentation

## 2024-03-23 ENCOUNTER — Other Ambulatory Visit: Payer: Self-pay | Admitting: Internal Medicine

## 2024-03-23 DIAGNOSIS — R921 Mammographic calcification found on diagnostic imaging of breast: Secondary | ICD-10-CM

## 2024-03-23 DIAGNOSIS — R928 Other abnormal and inconclusive findings on diagnostic imaging of breast: Secondary | ICD-10-CM

## 2024-03-30 ENCOUNTER — Telehealth: Payer: Self-pay

## 2024-03-30 NOTE — Telephone Encounter (Signed)
 Patient picked up 4 boxes of Ozempic.

## 2024-03-30 NOTE — Telephone Encounter (Signed)
 Lvm notifying pt has Ozempic  here ready for pick up  Ozempic  4 boxes  Lot: [id number] Exp: 09/23/2026

## 2024-03-31 ENCOUNTER — Encounter: Payer: Self-pay | Admitting: Pharmacist

## 2024-04-01 ENCOUNTER — Ambulatory Visit (INDEPENDENT_AMBULATORY_CARE_PROVIDER_SITE_OTHER): Payer: Medicare HMO | Admitting: *Deleted

## 2024-04-01 VITALS — Ht 65.0 in | Wt 186.0 lb

## 2024-04-01 DIAGNOSIS — Z0000 Encounter for general adult medical examination without abnormal findings: Secondary | ICD-10-CM | POA: Diagnosis not present

## 2024-04-01 NOTE — Progress Notes (Signed)
 Subjective:   Marissa Rodriguez is a 83 y.o. who presents for a Medicare Wellness preventive visit.  As a reminder, Annual Wellness Visits don't include a physical exam, and some assessments may be limited, especially if this visit is performed virtually. We may recommend an in-person follow-up visit with your provider if needed.  Visit Complete: Virtual I connected with  Marissa Rodriguez on 04/01/24 by a audio enabled telemedicine application and verified that I am speaking with the correct person using two identifiers.  Patient Location: Home  Provider Location: Home Office  I discussed the limitations of evaluation and management by telemedicine. The patient expressed understanding and agreed to proceed.  Vital Signs: Because this visit was a virtual/telehealth visit, some criteria may be missing or patient reported. Any vitals not documented were not able to be obtained and vitals that have been documented are patient reported.  VideoDeclined- This patient declined Librarian, academic. Therefore the visit was completed with audio only.  Persons Participating in Visit: Patient.  AWV Questionnaire: No: Patient Medicare AWV questionnaire was not completed prior to this visit.  Cardiac Risk Factors include: advanced age (>36men, >56 women);diabetes mellitus;hypertension;dyslipidemia;obesity (BMI >30kg/m2)     Objective:    Today's Vitals   04/01/24 0930  Weight: 186 lb (84.4 kg)  Height: 5' 5 (1.651 m)   Body mass index is 30.95 kg/m.     04/01/2024    9:41 AM 04/01/2023    9:59 AM 03/27/2022    9:24 AM 03/14/2021    9:54 AM 03/11/2020   10:50 AM 03/19/2019    9:38 AM 03/09/2019    9:30 AM  Advanced Directives  Does Patient Have a Medical Advance Directive? No No No No No No No  Would patient like information on creating a medical advance directive? No - Patient declined No - Patient declined No - Patient declined No - Patient declined No - Patient  declined No - Patient declined No - Patient declined    Current Medications (verified) Outpatient Encounter Medications as of 04/01/2024  Medication Sig   amLODipine  (NORVASC ) 5 MG tablet Take 1 tablet (5 mg total) by mouth daily.   atorvastatin  (LIPITOR) 40 MG tablet Take 1 tablet (40 mg total) by mouth daily.   blood glucose meter kit and supplies Dispense based on patient and insurance preference. Use to check blood sugars twice daily. (FOR ICD-10 E11.69).   cholecalciferol (VITAMIN D3) 25 MCG (1000 UNIT) tablet Take 1,000 Units by mouth daily.   dapagliflozin  propanediol (FARXIGA ) 10 MG TABS tablet Take 1 tablet (10 mg total) by mouth daily before breakfast.   Lancets (ONETOUCH DELICA PLUS LANCET33G) MISC USE TO TEST BLOOD SUGAR TWICE DAILY   levothyroxine  (SYNTHROID ) 100 MCG tablet TAKE 1 TABLET BY MOUTH ONCE DAILY BEFORE BREAKFAST   metoprolol  succinate (TOPROL -XL) 25 MG 24 hr tablet Take 1 tablet (25 mg total) by mouth at bedtime.   ONETOUCH VERIO test strip USE 1 STRIP TO CHECK GLUCOSE THREE TIMES DAILY   Semaglutide , 2 MG/DOSE, 8 MG/3ML SOPN Inject 2 mg as directed once a week.   telmisartan  (MICARDIS ) 80 MG tablet Take 1 tablet (80 mg total) by mouth daily.   No facility-administered encounter medications on file as of 04/01/2024.    Allergies (verified) Patient has no known allergies.   History: Past Medical History:  Diagnosis Date   Closed fracture of part of upper end of humerus 09/24/2015   Diabetes mellitus without complication (HCC)  History of MRI of brain and brain stem 10/2010   normal MRI/MRA done to rule out CVA   Hyperkalemia 01/16/2021   Hyperlipidemia    Hypertension    Hypothyroidism    Tubular adenoma of colon 04/2009   repeat due 2015   Past Surgical History:  Procedure Laterality Date   ABDOMINAL HYSTERECTOMY  1975   TAH/LSO, wedge resection of right   APPENDECTOMY  1975   incidental, done during TAH   COLONOSCOPY WITH PROPOFOL  N/A 03/19/2019    Procedure: COLONOSCOPY WITH BIOPSIES;  Surgeon: Jinny Carmine, MD;  Location: [facility];  Service: Endoscopy;  Laterality: N/A;  Diabetic   POLYPECTOMY N/A 03/19/2019   Procedure: POLYPECTOMY;  Surgeon: Jinny Carmine, MD;  Location: [facility];  Service: Endoscopy;  Laterality: N/A;   Family History  Problem Relation Age of Onset   Cancer Father        Lung    Breast cancer Sister 47   Social History   Socioeconomic History   Marital status: Widowed    Spouse name: Not on file   Number of children: Not on file   Years of education: Not on file   Highest education level: Not on file  Occupational History   Not on file  Tobacco Use   Smoking status: Never   Smokeless tobacco: Never  Vaping Use   Vaping status: Never Used  Substance and Sexual Activity   Alcohol use: No   Drug use: No   Sexual activity: Not on file  Other Topics Concern   Not on file  Social History Narrative   widow   Social Drivers of Health   Financial Resource Strain: Low Risk  (04/01/2024)   Overall Financial Resource Strain (CARDIA)    Difficulty of Paying Living Expenses: Not hard at all  Food Insecurity: No Food Insecurity (04/01/2024)   Hunger Vital Sign    Worried About Running Out of Food in the Last Year: Never true    Ran Out of Food in the Last Year: Never true  Transportation Needs: No Transportation Needs (04/01/2024)   PRAPARE - Administrator, Civil Service (Medical): No    Lack of Transportation (Non-Medical): No  Physical Activity: Insufficiently Active (04/01/2024)   Exercise Vital Sign    Days of Exercise per Week: 3 days    Minutes of Exercise per Session: 30 min  Stress: No Stress Concern Present (04/01/2024)   [employer] of Occupational Health - Occupational Stress Questionnaire    Feeling of Stress: Not at all  Social Connections: Moderately Integrated (04/01/2024)   Social Connection and Isolation Panel    Frequency of Communication with  Friends and Family: More than three times a week    Frequency of Social Gatherings with Friends and Family: More than three times a week    Attends Religious Services: More than 4 times per year    Active Member of [employer] or Organizations: Yes    Attends Banker Meetings: More than 4 times per year    Marital Status: Widowed    Tobacco Counseling Counseling given: Not Answered    Clinical Intake:  Pre-visit preparation completed: Yes  Pain : No/denies pain     BMI - recorded: 30.95 Nutritional Status: BMI > 30  Obese Nutritional Risks: None Diabetes: Yes CBG done?: No  Lab Results  Component Value Date   HGBA1C 6.1 02/13/2024   HGBA1C 6.3 11/12/2023   HGBA1C 6.3 05/14/2023  How often do you need to have someone help you when you read instructions, pamphlets, or other written materials from your doctor or pharmacy?: 1 - Never  Interpreter Needed?: No  Information entered by :: R. Xxavier Noon LPN   Activities of Daily Living     04/01/2024    9:32 AM  In your present state of health, do you have any difficulty performing the following activities:  Hearing? 0  Vision? 0  Difficulty concentrating or making decisions? 0  Walking or climbing stairs? 0  Dressing or bathing? 0  Doing errands, shopping? 0  Preparing Food and eating ? N  Using the Toilet? N  In the past six months, have you accidently leaked urine? Y  Do you have problems with loss of bowel control? N  Managing your Medications? N  Managing your Finances? N  Housekeeping or managing your Housekeeping? N    Patient Care Team: Marylynn Verneita CROME, MD as PCP - General (Internal Medicine) Jinny Carmine, MD as Consulting Physician (Gastroenterology)  I have updated your Care Teams any recent Medical Services you may have received from other providers in the past year.     Assessment:   This is a routine wellness examination for [place].  Hearing/Vision screen Hearing Screening - Comments:: No  issues Vision Screening - Comments:: readers   Goals Addressed             This Visit's Progress    Patient Stated       Wants to travel more       Depression Screen     04/01/2024    9:37 AM 02/13/2024   10:05 AM 11/12/2023    9:53 AM 05/14/2023   10:12 AM 04/01/2023    9:54 AM 11/09/2022    9:07 AM 05/09/2022   10:11 AM  PHQ 2/9 Scores  PHQ - 2 Score 0 0 0 0 0 0 0  PHQ- 9 Score 0   1 1      Fall Risk     04/01/2024    9:33 AM 02/13/2024   10:05 AM 11/12/2023    9:53 AM 05/14/2023   10:11 AM 04/01/2023    9:51 AM  Fall Risk   Falls in the past year? 0 0 0 0 0  Number falls in past yr: 0 0 0 0 0  Injury with Fall? 0 0 0 0 0  Risk for fall due to : No Fall Risks No Fall Risks No Fall Risks No Fall Risks No Fall Risks  Follow up Falls evaluation completed;Falls prevention discussed Falls evaluation completed Falls evaluation completed Falls evaluation completed Falls prevention discussed;Falls evaluation completed    MEDICARE RISK AT HOME:  Medicare Risk at Home Any stairs in or around the home?: Yes If so, are there any without handrails?: No Home free of loose throw rugs in walkways, pet beds, electrical cords, etc?: Yes Adequate lighting in your home to reduce risk of falls?: Yes Life alert?: No Use of a cane, walker or w/c?: No Grab bars in the bathroom?: No Shower chair or bench in shower?: No Elevated toilet seat or a handicapped toilet?: No  TIMED UP AND GO:  Was the test performed?  No  Cognitive Function: 6CIT completed    03/05/2018    9:58 AM  MMSE - Mini Mental State Exam  Orientation to time 5  Orientation to Place 5  Registration 3  Recall 3  Language- repeat 1  Language- read & follow direction 1  Copy design 1        04/01/2024    9:41 AM 04/01/2023   10:00 AM 03/27/2022    9:32 AM 03/09/2019    9:18 AM  6CIT Screen  What Year? 0 points 0 points 0 points 0 points  What month? 0 points 0 points 0 points 0 points  What time? 0 points  0 points 0 points 0 points  Count back from 20 0 points 0 points 0 points 0 points  Months in reverse 2 points 0 points 0 points 0 points  Repeat phrase 2 points 2 points 0 points   Total Score 4 points 2 points 0 points     Immunizations Immunization History  Administered Date(s) Administered   Fluad Trivalent(High Dose 65+) 05/14/2023   INFLUENZA, HIGH DOSE SEASONAL PF 03/24/2015, 03/05/2018, 03/30/2019   Influenza,inj,Quad PF,6+ Mos 07/01/2013   Influenza-Unspecified 04/11/2021, 03/01/2022   Pneumococcal Conjugate-13 03/17/2014, 03/30/2019   Pneumococcal Polysaccharide-23 07/02/2011, 08/16/2020   Tdap 10/10/2012    Screening Tests Health Maintenance  Topic Date Due   Zoster Vaccines- Shingrix (1 of 2) Never done   DTaP/Tdap/Td (2 - Td or Tdap) 10/11/2022   Colonoscopy  03/18/2024   Medicare Annual Wellness (AWV)  03/31/2024   Influenza Vaccine  09/22/2024 (Originally 01/24/2024)   HEMOGLOBIN A1C  08/15/2024   FOOT EXAM  11/11/2024   OPHTHALMOLOGY EXAM  01/23/2025   Diabetic kidney evaluation - eGFR measurement  02/12/2025   Diabetic kidney evaluation - Urine ACR  02/12/2025   Mammogram  03/20/2025   Pneumococcal Vaccine: 50+ Years  Completed   DEXA SCAN  Completed   Meningococcal B Vaccine  Aged Out   COVID-19 Vaccine  Discontinued   Hepatitis C Screening  Discontinued    Health Maintenance Items Addressed: Discussed the need to update flu, tetanus and shingles vaccines. Patient declines colonoscopy at this time but will think about it.  Patient stated that she has already discussed this with Dr. Marylynn  Additional Screening:  Vision Screening: Recommended annual ophthalmology exams for early detection of glaucoma and other disorders of the eye. Is the patient up to date with their annual eye exam?  Yes  Who is the provider or what is the name of the office in which the patient attends annual eye exams? [facility]  Dental Screening: Recommended annual dental exams  for proper oral hygiene  Community Resource Referral / Chronic Care Management: CRR required this visit?  No   CCM required this visit?  No   Plan:    I have personally reviewed and noted the following in the patient's chart:   Medical and social history Use of alcohol, tobacco or illicit drugs  Current medications and supplements including opioid prescriptions. Patient is not currently taking opioid prescriptions. Functional ability and status Nutritional status Physical activity Advanced directives List of other physicians Hospitalizations, surgeries, and ER visits in previous 12 months Vitals Screenings to include cognitive, depression, and falls Referrals and appointments  In addition, I have reviewed and discussed with patient certain preventive protocols, quality metrics, and best practice recommendations. A written personalized care plan for preventive services as well as general preventive health recommendations were provided to patient.   Angeline Fredericks, LPN   [id number]   After Visit Summary: (MyChart) Due to this being a telephonic visit, the after visit summary with patients personalized plan was offered to patient via MyChart   Notes: Nothing significant to report at this time.

## 2024-04-01 NOTE — Patient Instructions (Addendum)
 [name],  Thank you for taking the time for your Medicare Wellness Visit. I appreciate your continued commitment to your health goals. Please review the care plan we discussed, and feel free to reach out if I can assist you further.  Medicare recommends these wellness visits once per year to help you and your care team stay ahead of potential health issues. These visits are designed to focus on prevention, allowing your provider to concentrate on managing your acute and chronic conditions during your regular appointments.  Please note that Annual Wellness Visits do not include a physical exam. Some assessments may be limited, especially if the visit was conducted virtually. If needed, we may recommend a separate in-person follow-up with your provider.  Ongoing Care Seeing your primary care provider every 3 to 6 months helps us  monitor your health and provide consistent, personalized care.   Remember to get your flu, tetanus and shingles vaccines. Consider getting your colonoscopy as discussed.   Referrals If a referral was made during today's visit and you haven't received any updates within two weeks, please contact the referred provider directly to check on the status.  Recommended Screenings:  Health Maintenance  Topic Date Due   Zoster (Shingles) Vaccine (1 of 2) Never done   DTaP/Tdap/Td vaccine (2 - Td or Tdap) 10/11/2022   Colon Cancer Screening  03/18/2024   Flu Shot  09/22/2024*   Hemoglobin A1C  08/15/2024   Complete foot exam   11/11/2024   Eye exam for diabetics  01/23/2025   Yearly kidney function blood test for diabetes  02/12/2025   Yearly kidney health urinalysis for diabetes  02/12/2025   Breast Cancer Screening  03/20/2025   Medicare Annual Wellness Visit  04/01/2025   Pneumococcal Vaccine for age over 80  Completed   DEXA scan (bone density measurement)  Completed   Meningitis B Vaccine  Aged Out   COVID-19 Vaccine  Discontinued   Hepatitis C Screening   Discontinued  *Topic was postponed. The date shown is not the original due date.       04/01/2024    9:41 AM  Advanced Directives  Does Patient Have a Medical Advance Directive? No  Would patient like information on creating a medical advance directive? No - Patient declined   Advance Care Planning is important because it: Ensures you receive medical care that aligns with your values, goals, and preferences. Provides guidance to your family and loved ones, reducing the emotional burden of decision-making during critical moments.  Vision: Annual vision screenings are recommended for early detection of glaucoma, cataracts, and diabetic retinopathy. These exams can also reveal signs of chronic conditions such as diabetes and high blood pressure.  Dental: Annual dental screenings help detect early signs of oral cancer, gum disease, and other conditions linked to overall health, including heart disease and diabetes.  Please see the attached documents for additional preventive care recommendations.

## 2024-04-02 ENCOUNTER — Other Ambulatory Visit: Payer: Self-pay | Admitting: Internal Medicine

## 2024-04-02 DIAGNOSIS — E1121 Type 2 diabetes mellitus with diabetic nephropathy: Secondary | ICD-10-CM

## 2024-04-09 ENCOUNTER — Ambulatory Visit
Admission: RE | Admit: 2024-04-09 | Discharge: 2024-04-09 | Disposition: A | Discharge status: Home / Self Care | Source: Ambulatory Visit | Attending: Internal Medicine | Admitting: Internal Medicine

## 2024-04-09 DIAGNOSIS — R928 Other abnormal and inconclusive findings on diagnostic imaging of breast: Secondary | ICD-10-CM | POA: Insufficient documentation

## 2024-04-09 DIAGNOSIS — R92333 Mammographic heterogeneous density, bilateral breasts: Secondary | ICD-10-CM | POA: Diagnosis not present

## 2024-04-12 DIAGNOSIS — R921 Mammographic calcification found on diagnostic imaging of breast: Secondary | ICD-10-CM | POA: Insufficient documentation

## 2024-04-12 NOTE — Assessment & Plan Note (Signed)
Biopsy recommended by radiology

## 2024-04-14 ENCOUNTER — Ambulatory Visit
Admission: RE | Admit: 2024-04-14 | Discharge: 2024-04-14 | Disposition: A | Discharge status: Home / Self Care | Source: Ambulatory Visit | Attending: Internal Medicine | Admitting: Internal Medicine

## 2024-04-14 DIAGNOSIS — D0512 Intraductal carcinoma in situ of left breast: Secondary | ICD-10-CM | POA: Diagnosis not present

## 2024-04-14 DIAGNOSIS — R928 Other abnormal and inconclusive findings on diagnostic imaging of breast: Secondary | ICD-10-CM | POA: Insufficient documentation

## 2024-04-14 MED ORDER — LIDOCAINE 1 % OPTIME INJ - NO CHARGE
5.0000 mL | Freq: Once | INTRAMUSCULAR | Status: AC
  Administered 2024-04-14: 5 mL
  Filled 2024-04-14: qty 6

## 2024-04-14 MED ORDER — LIDOCAINE-EPINEPHRINE 1 %-1:100000 IJ SOLN
20.0000 mL | Freq: Once | INTRAMUSCULAR | Status: AC
  Administered 2024-04-14: 20 mL
  Filled 2024-04-14: qty 20

## 2024-04-15 LAB — SURGICAL PATHOLOGY

## 2024-04-16 ENCOUNTER — Encounter: Payer: Self-pay | Admitting: Internal Medicine

## 2024-04-16 ENCOUNTER — Encounter: Payer: Self-pay | Admitting: *Deleted

## 2024-04-16 DIAGNOSIS — D0510 Intraductal carcinoma in situ of unspecified breast: Secondary | ICD-10-CM

## 2024-04-16 DIAGNOSIS — D0512 Intraductal carcinoma in situ of left breast: Secondary | ICD-10-CM

## 2024-04-16 HISTORY — DX: Intraductal carcinoma in situ of unspecified breast: D05.10

## 2024-04-16 NOTE — Progress Notes (Signed)
 Received referral for newly diagnosed breast cancer from [facility].  Navigation initiated.  Marissa Rodriguez will see Dr. Babara on 10/27 at 2.  She will see Dr. Cesar on 10/30 at 3:45.

## 2024-04-20 ENCOUNTER — Encounter: Payer: Self-pay | Admitting: *Deleted

## 2024-04-20 ENCOUNTER — Encounter: Payer: Self-pay | Admitting: Oncology

## 2024-04-20 ENCOUNTER — Inpatient Hospital Stay: Attending: Oncology | Admitting: Oncology

## 2024-04-20 ENCOUNTER — Inpatient Hospital Stay

## 2024-04-20 VITALS — BP 139/56 | HR 76 | Temp 99.0°F | Resp 16 | Ht 65.0 in | Wt 186.0 lb

## 2024-04-20 DIAGNOSIS — Z170 Estrogen receptor positive status [ER+]: Secondary | ICD-10-CM | POA: Insufficient documentation

## 2024-04-20 DIAGNOSIS — E039 Hypothyroidism, unspecified: Secondary | ICD-10-CM | POA: Insufficient documentation

## 2024-04-20 DIAGNOSIS — Z90710 Acquired absence of both cervix and uterus: Secondary | ICD-10-CM | POA: Insufficient documentation

## 2024-04-20 DIAGNOSIS — Z79899 Other long term (current) drug therapy: Secondary | ICD-10-CM | POA: Insufficient documentation

## 2024-04-20 DIAGNOSIS — D0512 Intraductal carcinoma in situ of left breast: Secondary | ICD-10-CM | POA: Insufficient documentation

## 2024-04-20 DIAGNOSIS — Z803 Family history of malignant neoplasm of breast: Secondary | ICD-10-CM | POA: Diagnosis not present

## 2024-04-20 DIAGNOSIS — I10 Essential (primary) hypertension: Secondary | ICD-10-CM | POA: Diagnosis not present

## 2024-04-20 DIAGNOSIS — E119 Type 2 diabetes mellitus without complications: Secondary | ICD-10-CM | POA: Diagnosis not present

## 2024-04-20 DIAGNOSIS — Z801 Family history of malignant neoplasm of trachea, bronchus and lung: Secondary | ICD-10-CM | POA: Diagnosis not present

## 2024-04-20 DIAGNOSIS — Z860101 Personal history of adenomatous and serrated colon polyps: Secondary | ICD-10-CM | POA: Diagnosis not present

## 2024-04-20 NOTE — Progress Notes (Signed)
 Hematology/Oncology Consult Note [phone] [phone]     REFERRING PROVIDER: Marylynn Verneita CROME, MD    CHIEF COMPLAINTS/PURPOSE OF CONSULTATION:  Left breast DCIS  ASSESSMENT & PLAN:   Ductal carcinoma in situ of breast Diagnosis of left breast DCIS was reviewed and discussed with patient.  ER positive. Recommend lumpectomy. Discussed about adjuvant radiation and endocrine therapy for 5 years. Patient has appointment with surgery this week.  Family history of breast cancer Discussed option of genetic testing.  Patient would like to defer for now.   No orders of the defined types were placed in this encounter.  Follow-up to be determined.   All questions were answered. The patient knows to call the clinic with any problems, questions or concerns.  Zelphia Cap, MD, PhD [facility] Hematology Oncology 04/20/2024    HISTORY OF PRESENTING ILLNESS:  Marissa Rodriguez 83 y.o. female presents to establish care for left breast DCIS I have reviewed her chart and materials related to her cancer extensively and collaborated history with the patient. Summary of oncologic history is as follows: Oncology History  Ductal carcinoma in situ of breast  03/20/2024 Mammogram   Bilateral breast screening mammogram showed calcifications in the outer left breast warrant further evaluation.   04/09/2024 Mammogram   Diagnostic left breast mammogram showed Indeterminate left breast calcifications spanning 14 mm to warrant further characterization with a stereotactic biopsy.   04/16/2024 Initial Diagnosis   Ductal carcinoma in situ of breast  1. Breast, left, needle core biopsy, calcifications, upper outer quad, 14mm (coil clip)   - DUCTAL CARCINOMA IN SITU, INTERMEDIATE NUCLEAR GRADE, PAPILLARY AND CRIBRIFORM TYPES, WITH NECROSIS AND CALCIFICATIONS, 6 MM   ER 100% positive  Menarche at age of 12 First live birth at age of 85 OCP use: Less than 5 years History of  hysterectomy: At age of 26. Menopausal status: Postmenopausal History of HRT use: No History of chest radiation: No Number of previous breast biopsies: No Sister with history of breast cancer.      Patient presents to discuss management plan.  Accompanied by sister.  She denies any breast changes.  MEDICAL HISTORY:  Past Medical History:  Diagnosis Date   Closed fracture of part of upper end of humerus 09/24/2015   Diabetes mellitus without complication (HCC)    History of MRI of brain and brain stem 10/2010   normal MRI/MRA done to rule out CVA   Hyperkalemia 01/16/2021   Hyperlipidemia    Hypertension    Hypothyroidism    Tubular adenoma of colon 04/2009   repeat due 2015    SURGICAL HISTORY: Past Surgical History:  Procedure Laterality Date   ABDOMINAL HYSTERECTOMY  06/25/1973   TAH/LSO, wedge resection of right   APPENDECTOMY  06/25/1973   incidental, done during TAH   BREAST BIOPSY Left 04/14/2024   stereo calcs coil path pending   BREAST BIOPSY Left 04/14/2024   MM LT BREAST BX W LOC DEV 1ST LESION IMAGE BX SPEC STEREO GUIDE 04/14/2024 [facility]   COLONOSCOPY WITH PROPOFOL  N/A 03/19/2019   Procedure: COLONOSCOPY WITH BIOPSIES;  Surgeon: Jinny Carmine, MD;  Location: [facility];  Service: Endoscopy;  Laterality: N/A;  Diabetic   POLYPECTOMY N/A 03/19/2019   Procedure: POLYPECTOMY;  Surgeon: Jinny Carmine, MD;  Location: [facility];  Service: Endoscopy;  Laterality: N/A;    SOCIAL HISTORY: Social History   Socioeconomic History   Marital status: Widowed    Spouse name: Not on file   Number of  children: Not on file   Years of education: Not on file   Highest education level: Not on file  Occupational History   Not on file  Tobacco Use   Smoking status: Never   Smokeless tobacco: Never  Vaping Use   Vaping status: Never Used  Substance and Sexual Activity   Alcohol use: No   Drug use: No   Sexual activity: Not Currently  Other  Topics Concern   Not on file  Social History Narrative   widow   Social Drivers of Health   Financial Resource Strain: Low Risk  (04/01/2024)   Overall Financial Resource Strain (CARDIA)    Difficulty of Paying Living Expenses: Not hard at all  Food Insecurity: No Food Insecurity (04/20/2024)   Hunger Vital Sign    Worried About Running Out of Food in the Last Year: Never true    Ran Out of Food in the Last Year: Never true  Transportation Needs: No Transportation Needs (04/20/2024)   PRAPARE - Administrator, Civil Service (Medical): No    Lack of Transportation (Non-Medical): No  Physical Activity: Insufficiently Active (04/01/2024)   Exercise Vital Sign    Days of Exercise per Week: 3 days    Minutes of Exercise per Session: 30 min  Stress: No Stress Concern Present (04/01/2024)   [employer] of Occupational Health - Occupational Stress Questionnaire    Feeling of Stress: Not at all  Social Connections: Moderately Integrated (04/01/2024)   Social Connection and Isolation Panel    Frequency of Communication with Friends and Family: More than three times a week    Frequency of Social Gatherings with Friends and Family: More than three times a week    Attends Religious Services: More than 4 times per year    Active Member of [employer] or Organizations: Yes    Attends Banker Meetings: More than 4 times per year    Marital Status: Widowed  Intimate Partner Violence: Not At Risk (04/20/2024)   Humiliation, Afraid, Rape, and Kick questionnaire    Fear of Current or Ex-Partner: No    Emotionally Abused: No    Physically Abused: No    Sexually Abused: No    FAMILY HISTORY: Family History  Problem Relation Age of Onset   Cancer Father        Lung    Breast cancer Sister 68   Cancer Brother        details unknown.    ALLERGIES:  has no known allergies.  MEDICATIONS:  Current Outpatient Medications  Medication Sig Dispense Refill   amLODipine   (NORVASC ) 5 MG tablet Take 1 tablet (5 mg total) by mouth daily. 90 tablet 1   atorvastatin  (LIPITOR) 40 MG tablet Take 1 tablet (40 mg total) by mouth daily. 90 tablet 1   blood glucose meter kit and supplies Dispense based on patient and insurance preference. Use to check blood sugars twice daily. (FOR ICD-10 E11.69). 1 each 0   cholecalciferol (VITAMIN D3) 25 MCG (1000 UNIT) tablet Take 1,000 Units by mouth daily.     dapagliflozin  propanediol (FARXIGA ) 10 MG TABS tablet Take 1 tablet (10 mg total) by mouth daily before breakfast. 90 tablet 3   Lancets (ONETOUCH DELICA PLUS LANCET33G) MISC USE TO TEST BLOOD SUGAR TWICE DAILY     levothyroxine  (SYNTHROID ) 100 MCG tablet TAKE 1 TABLET BY MOUTH ONCE DAILY BEFORE BREAKFAST 90 tablet 3   metoprolol  succinate (TOPROL -XL) 25 MG 24 hr tablet Take 1  tablet (25 mg total) by mouth at bedtime. 90 tablet 1   ONETOUCH VERIO test strip USE 1 STRIP TO CHECK GLUCOSE THREE TIMES DAILY 200 each 0   Semaglutide , 2 MG/DOSE, 8 MG/3ML SOPN Inject 2 mg as directed once a week. 3 mL 0   telmisartan  (MICARDIS ) 80 MG tablet Take 1 tablet (80 mg total) by mouth daily. 90 tablet 1   No current facility-administered medications for this visit.    Review of Systems  Constitutional:  Negative for appetite change, chills, fatigue and fever.  HENT:   Negative for hearing loss and voice change.   Eyes:  Negative for eye problems.  Respiratory:  Negative for chest tightness and cough.   Cardiovascular:  Negative for chest pain.  Gastrointestinal:  Negative for abdominal distention, abdominal pain and blood in stool.  Endocrine: Negative for hot flashes.  Genitourinary:  Negative for difficulty urinating and frequency.   Musculoskeletal:  Negative for arthralgias.  Skin:  Negative for itching and rash.  Neurological:  Negative for extremity weakness.  Hematological:  Negative for adenopathy.  Psychiatric/Behavioral:  Negative for confusion.      PHYSICAL  EXAMINATION: ECOG PERFORMANCE STATUS: 0 - Asymptomatic  Vitals:   04/20/24 1357  BP: (!) 139/56  Pulse: 76  Resp: 16  Temp: 99 F (37.2 C)  SpO2: 99%   Filed Weights   04/20/24 1357  Weight: 186 lb (84.4 kg)    Physical Exam Constitutional:      General: She is not in acute distress.    Appearance: She is not diaphoretic.  HENT:     Head: Normocephalic and atraumatic.     Mouth/Throat:     Pharynx: No oropharyngeal exudate.  Eyes:     General: No scleral icterus.    Pupils: Pupils are equal, round, and reactive to light.  Cardiovascular:     Rate and Rhythm: Normal rate and regular rhythm.     Heart sounds: No murmur heard. Pulmonary:     Effort: Pulmonary effort is normal. No respiratory distress.     Breath sounds: Normal breath sounds.  Abdominal:     General: There is no distension.     Palpations: Abdomen is soft.     Tenderness: There is no abdominal tenderness.  Musculoskeletal:        General: Normal range of motion.     Cervical back: Normal range of motion and neck supple.  Skin:    General: Skin is warm and dry.     Findings: No erythema.  Neurological:     Mental Status: She is alert and oriented to person, place, and time.     Cranial Nerves: No cranial nerve deficit.     Motor: No abnormal muscle tone.     Coordination: Coordination normal.  Psychiatric:        Mood and Affect: Affect normal.     Breast exam was performed in seated and lying down position. Patient is status post left breast biopsy with focal tissue tenderness/swelling.[name]   No palpable breast masses in right breast.  No palpable axillary adenopathy bilaterally.   LABORATORY DATA:  I have reviewed the data as listed    Latest Ref Rng & Units 02/19/2019    8:34 AM 09/09/2012    1:29 PM 10/02/2011    9:52 PM  CBC  WBC 4.0 - 10.5 K/uL 9.4  8.1  9.7   Hemoglobin 12.0 - 15.0 g/dL 87.2  85.9  87.4   Hematocrit 36.0 -  46.0 % 38.6  41.2  37.4   Platelets 150.0 - 400.0 K/uL 262.0   266.0  388       Latest Ref Rng & Units 02/13/2024   10:51 AM 11/12/2023   10:40 AM 05/14/2023   10:40 AM  CMP  Glucose 70 - 99 mg/dL 885  884  888   BUN 6 - 23 mg/dL 21  27  22    Creatinine 0.40 - 1.20 mg/dL 8.76  8.73  8.84   Sodium 135 - 145 mEq/L 139  138  137   Potassium 3.5 - 5.1 mEq/L 4.7  4.1  4.7   Chloride 96 - 112 mEq/L 104  103  102   CO2 19 - 32 mEq/L 26  22  26    Calcium  8.4 - 10.5 mg/dL 9.7  9.8  89.9   Total Protein 6.0 - 8.3 g/dL 7.8  8.3  7.9   Total Bilirubin 0.2 - 1.2 mg/dL 0.6  0.6  0.6   Alkaline Phos 39 - 117 U/L 41  48  46   AST 0 - 37 U/L 27  29  24    ALT 0 - 35 U/L 22  25  20       RADIOGRAPHIC STUDIES: I have personally reviewed the radiological images as listed and agreed with the findings in the report. MM LT BREAST BX W LOC DEV 1ST LESION IMAGE BX SPEC STEREO GUIDE Addendum Date: 04/16/2024 ADDENDUM REPORT: 04/16/2024 07:44 ADDENDUM: PATHOLOGY revealed: Breast, left, needle core biopsy, calcifications, upper outer quad, 14 mm (coil clip)- DUCTAL CARCINOMA IN SITU, INTERMEDIATE NUCLEAR GRADE, PAPILLARY AND CRIBRIFORM TYPES, WITH NECROSIS AND CALCIFICATIONS, 6 MM. Pathology results are CONCORDANT with imaging findings, per Dr. Norleen Croak. Pathology results and recommendations were discussed with patient via telephone on 04/15/2024 by Mliss Molt RN. Patient reported biopsy site doing well with no adverse symptoms, and slight tenderness at the site. Post biopsy care instructions were reviewed, questions were answered and my direct phone number was provided. Patient was instructed to call [facility] for any additional questions or concerns related to biopsy site. RECOMMENDATIONS: 1. Surgical and oncological consultation. Request for surgical and oncological consultation relayed to Shasta Ada RN at [facility] by Mliss Molt RN on 04/15/2024. Pathology results reported by Mliss Molt RN 04/15/2024. Electronically Signed   By: Norleen Croak M.D.   On: 04/16/2024 07:44   Result Date: 04/16/2024 CLINICAL DATA:  Indeterminate LEFT breast calcifications EXAM: BREAST STEREOTACTIC CORE NEEDLE BIOPSY COMPARISON:  Previous exam(s). FINDINGS: The patient and I discussed the procedure of stereotactic-guided biopsy including benefits and alternatives. We discussed the high likelihood of a successful procedure. We discussed the risks of the procedure including infection, bleeding, tissue injury, clip migration, and inadequate sampling. Informed written consent was given. The usual time out protocol was performed immediately prior to the procedure. Using sterile technique and 1% lidocaine  and 1% lidocaine  with epinephrine  as local anesthetic, under stereotactic guidance, a 9 gauge vacuum assisted device was used to perform core needle biopsy of calcifications in the upper outer quadrant of the left breast using a LATERAL approach. Specimen radiograph was performed showing representative calcifications. Specimens with calcifications are identified for pathology. Lesion quadrant: Upper-outer At the conclusion of the procedure, coil shaped tissue marker clip was deployed into the biopsy cavity. Follow-up 2-view mammogram was performed and dictated separately. IMPRESSION: Stereotactic-guided biopsy of LEFT breast calcifications. No apparent complications. Electronically Signed: By: Norleen Croak M.D. On: 04/14/2024 08:25  MM CLIP PLACEMENT LEFT Result Date: 04/14/2024 CLINICAL DATA:  Status post LEFT breast stereotactic biopsy for calcifications EXAM: 3D DIAGNOSTIC LEFT MAMMOGRAM POST STEREOTACTIC BIOPSY COMPARISON:  Previous exam(s). ACR Breast Density Category c: The breasts are heterogeneously dense, which may obscure small masses. FINDINGS: 3D Mammographic images were obtained following stereotactic guided biopsy of LEFT breast calcifications in the upper-outer quadrant. The biopsy marking clip is in expected position at the site of biopsy.  IMPRESSION: Appropriate positioning of the coil shaped biopsy marking clip at the site of biopsy in the LEFT breast. Final Assessment: Post Procedure Mammograms for Marker Placement Electronically Signed   By: Norleen Croak M.D.   On: 04/14/2024 08:31   MM 3D DIAGNOSTIC MAMMOGRAM UNILATERAL LEFT BREAST Result Date: 04/09/2024 CLINICAL DATA:  Screening recall LEFT breast calcifications. EXAM: DIGITAL DIAGNOSTIC UNILATERAL LEFT MAMMOGRAM WITH TOMOSYNTHESIS AND CAD TECHNIQUE: Left digital diagnostic mammography and breast tomosynthesis was performed. The images were evaluated with computer-aided detection. COMPARISON:  Previous exam(s). ACR Breast Density Category c: The breasts are heterogeneously dense, which may obscure small masses. FINDINGS: Full field lateral and spot magnification view(s) were performed and demonstrate 14 mm of grouped amorphous calcifications in the upper-outer quadrant of the LEFT breast, corresponding with the abnormality described at the time of screening. IMPRESSION: Indeterminate LEFT breast calcifications spanning 14 mm warrant further characterization with stereotactic biopsy. RECOMMENDATION: Stereotactic biopsy of the LEFT breast x1. I have discussed the findings and recommendations with the patient. The biopsy procedure was discussed with the patient and questions were answered. Patient expressed their understanding of the biopsy recommendation. Patient will be scheduled for biopsy at her earliest convenience by the schedulers. Ordering provider will be notified. If applicable, a reminder letter will be sent to the patient regarding the next appointment. BI-RADS CATEGORY  4: Suspicious. Electronically Signed   By: Norleen Croak M.D.   On: 04/09/2024 13:56

## 2024-04-20 NOTE — Assessment & Plan Note (Signed)
 Discussed option of genetic testing.  Patient would like to defer for now.

## 2024-04-20 NOTE — Assessment & Plan Note (Signed)
 Diagnosis of left breast DCIS was reviewed and discussed with patient.  ER positive. Recommend lumpectomy. Discussed about adjuvant radiation and endocrine therapy for 5 years. Patient has appointment with surgery this week.

## 2024-04-20 NOTE — Progress Notes (Signed)
 Accompanied patient and family to initial medical oncology appointment.   Reviewed Breast Cancer treatment handbook.   Care plan summary given to patient.   Reviewed outreach programs and cancer center services. She does not want genetics referral at this time and will consider it at a later date.

## 2024-04-23 ENCOUNTER — Telehealth: Payer: Self-pay

## 2024-04-23 ENCOUNTER — Ambulatory Visit: Payer: Self-pay | Admitting: General Surgery

## 2024-04-23 DIAGNOSIS — D0512 Intraductal carcinoma in situ of left breast: Secondary | ICD-10-CM | POA: Diagnosis not present

## 2024-04-23 NOTE — Telephone Encounter (Signed)
 PAP: Patient assistance application for Farxiga through [employer] (AZ[employer]) has been mailed to pt's home address on file. Provider portion of application will be faxed to provider's office.

## 2024-04-23 NOTE — Progress Notes (Signed)
 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 She is accompanied by her sister, Rocky.  She underwent a screening mammogram on March 20, 2024, which suggested calcifications in the left breast. A follow-up mammogram on April 09, 2024, was performed to further evaluate the left breast calcifications. 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  No pain, skin changes, or palpable masses were noted prior to the mammogram. She recalls having breast cancer eight years ago but does not remember the specific type.  Patient was evaluated by medical oncology.  Patient had discussion about treatment plan including upfront partial mastectomy followed by radiation therapy and adjuvant endocrine therapy.  She describes the biopsy experience as 'not bad at all' and notes no significant bruising post-procedure.      PAST MEDICAL HISTORY:  Past Medical History:  Diagnosis Date  . Acute colitis, unspecified 06/25/2009   hospitalization, stool specimen revealing growth methicillin-resistant Staph aureus  . Breast cancer (CMS/HHS-HCC) 03/2024   Lt DCIS  . Colon polyp   . Hypercholesterolemia   . Hypertension   . Hypothyroidism         PAST SURGICAL HISTORY:   Past Surgical History:  Procedure Laterality Date  . COLONOSCOPY  05/10/2009   02/24/2004  . UPPER GASTROINTESTINAL ENDOSCOPY  09/12/2009  . ABDOMINAL HYSTERECTOMY W/ PARTIAL VAGINACTOMY     with one-fourth of an ovary left -- age 7         MEDICATIONS:  Outpatient Encounter Medications as of 04/23/2024  Medication Sig Dispense Refill  . amLODIPine  (NORVASC ) 5 MG tablet Take 5 mg by mouth once daily    . atorvastatin  (LIPITOR) 40 MG tablet Take 40 mg by mouth once daily    . cholecalciferol (VITAMIN D3) 1,000 unit capsule  Take 1,000 Units by mouth once daily.    . dapagliflozin  propanediol (FARXIGA ) 10 mg tablet Take 10 mg by mouth every morning before breakfast    . levothyroxine  (SYNTHROID ) 100 MCG tablet Take 100 mcg by mouth once daily    . metoprolol  SUCCinate (TOPROL -XL) 25 MG XL tablet Take 25 mg by mouth at bedtime    . semaglutide  (OZEMPIC ) 2 mg/dose (8 mg/3 mL) pen injector Inject 2 mg as directed once a week    . telmisartan  (MICARDIS ) 80 MG tablet Take 80 mg by mouth once daily    . aspirin 81 MG EC tablet Take 81 mg by mouth once daily. (Patient not taking: Reported on 04/23/2024)    . CALCIUM  ORAL Take by mouth once daily. (Patient not taking: Reported on 04/23/2024)    . lisinopril  (PRINIVIL ,ZESTRIL ) 10 MG tablet Take 10 mg by mouth once daily. (Patient not taking: Reported on 04/23/2024)    . triamcinolone 0.1 % cream Apply topically as directed. (Patient not taking: Reported on 04/23/2024)     No facility-administered encounter medications on file as of 04/23/2024.     ALLERGIES:   Patient has no known allergies.   SOCIAL HISTORY:  Social History   Socioeconomic History  . Marital status: Unknown  Tobacco Use  . Smoking status: Never  Vaping Use  . Vaping status: Never Used  Substance and Sexual Activity  . Alcohol use: No  . Drug use: No   Social Drivers of Corporate Investment Banker Strain: Low Risk  (04/23/2024)  Overall Physicist, Medical Strain (CARDIA)   . Difficulty of Paying Living Expenses: Not hard at all  Food Insecurity: No Food Insecurity (04/23/2024)   Hunger Vital Sign   . Worried About Programme Researcher, Broadcasting/film/video in the Last Year: Never true   . Ran Out of Food in the Last Year: Never true  Transportation Needs: No Transportation Needs (04/23/2024)   PRAPARE - Transportation   . Lack of Transportation (Medical): No   . Lack of Transportation (Non-Medical): No    FAMILY HISTORY:  Family History  Problem Relation Name Age of Onset  . High blood pressure  (Hypertension) Mother    . Diabetes Mother    . Hyperlipidemia (Elevated cholesterol) Mother    . High blood pressure (Hypertension) Father    . Colon polyps Sister    . Breast cancer Sister    . High blood pressure (Hypertension) Brother    . Hyperlipidemia (Elevated cholesterol) Brother    . Diabetes Brother       GENERAL REVIEW OF SYSTEMS:   General ROS: negative for - chills, fatigue, fever, weight gain or weight loss Allergy and Immunology ROS: negative for - hives  Hematological and Lymphatic ROS: negative for - bleeding problems or bruising, negative for palpable nodes Endocrine ROS: negative for - heat or cold intolerance, hair changes Respiratory ROS: negative for - cough, shortness of breath or wheezing Cardiovascular ROS: no chest pain or palpitations GI ROS: negative for nausea, vomiting, abdominal pain, diarrhea, constipation Musculoskeletal ROS: negative for - joint swelling or muscle pain Neurological ROS: negative for - confusion, syncope Dermatological ROS: negative for pruritus and rash  PHYSICAL EXAM:  Vitals:   04/23/24 1546  BP: (!) 147/77  Pulse: 90  .  Ht:165.1 cm (5' 5) Wt:83.9 kg (185 lb) ADJ:Anib surface area is 1.96 meters squared. Body mass index is 30.79 kg/m.[name]   GENERAL: Alert, active, oriented x3  HEENT: Pupils equal reactive to light. Extraocular movements are intact. Sclera clear. Palpebral conjunctiva normal red color.Pharynx clear.  NECK: Supple with no palpable mass and no adenopathy.  LUNGS: Sound clear with no rales rhonchi or wheezes.  HEART: Regular rhythm S1 and S2 without murmur.  ABDOMEN: Soft and depressible, nontender with no palpable mass, no hepatomegaly. Wounds dry and clean.  EXTREMITIES: Well-developed well-nourished symmetrical with no dependent edema.  NEUROLOGICAL: Awake alert oriented, facial expression symmetrical, moving all extremities.   Results RADIOLOGY Mammogram: Indeterminate left breast calcification  spanning 14 mm (04/09/2024)  PATHOLOGY Stereotactic biopsy: Ductal carcinoma in situ, intermediate nuclear grade, ER positive (04/14/2024)    Assessment & Plan Ductal carcinoma in situ of left breast   Ductal carcinoma in situ (DCIS) of the left breast was confirmed by stereotactic biopsy on April 14, 2024. The 14 mm lesion is ER positive and classified as intermediate nuclear grade, stage 0, indicating confinement within the ducts. A partial mastectomy is recommended to excise the calcifications with at least 2 mm of normal breast tissue margins. Intraoperative assessment will be performed to evaluate margins. A radiofrequency tag will guide the surgical excision. Post-operative care includes pain management with ice packs, Tylenol , or ibuprofen, and stronger medication if needed. She should wear a sports bra for compression, monitor for fluid accumulation, and can shower normally with skin glue protecting the incision. Same-day discharge is planned with an expected recovery period of approximately two weeks.  Patient will follow-up by medical oncology for discussion of adjuvant radiation therapy and adjuvant endocrine therapy.   Ductal carcinoma in  situ (DCIS) of left breast [D05.12]          Patient and her sister verbalized understanding, all questions were answered, and were agreeable with the plan outlined above.   Lucas Sjogren, MD  Electronically signed by Lucas Sjogren, MD

## 2024-04-23 NOTE — H&P (View-Only) (Signed)
 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 She is accompanied by her sister, Marissa Rodriguez.  She underwent a screening mammogram on March 20, 2024, which suggested calcifications in the left breast. A follow-up mammogram on April 09, 2024, was performed to further evaluate the left breast calcifications. 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  No pain, skin changes, or palpable masses were noted prior to the mammogram. She recalls having breast cancer eight years ago but does not remember the specific type.  Patient was evaluated by medical oncology.  Patient had discussion about treatment plan including upfront partial mastectomy followed by radiation therapy and adjuvant endocrine therapy.  She describes the biopsy experience as 'not bad at all' and notes no significant bruising post-procedure.      PAST MEDICAL HISTORY:  Past Medical History:  Diagnosis Date  . Acute colitis, unspecified 06/25/2009   hospitalization, stool specimen revealing growth methicillin-resistant Staph aureus  . Breast cancer (CMS/HHS-HCC) 03/2024   Lt DCIS  . Colon polyp   . Hypercholesterolemia   . Hypertension   . Hypothyroidism         PAST SURGICAL HISTORY:   Past Surgical History:  Procedure Laterality Date  . COLONOSCOPY  05/10/2009   02/24/2004  . UPPER GASTROINTESTINAL ENDOSCOPY  09/12/2009  . ABDOMINAL HYSTERECTOMY W/ PARTIAL VAGINACTOMY     with one-fourth of an ovary left -- age 7         MEDICATIONS:  Outpatient Encounter Medications as of 04/23/2024  Medication Sig Dispense Refill  . amLODIPine  (NORVASC ) 5 MG tablet Take 5 mg by mouth once daily    . atorvastatin  (LIPITOR) 40 MG tablet Take 40 mg by mouth once daily    . cholecalciferol (VITAMIN D3) 1,000 unit capsule  Take 1,000 Units by mouth once daily.    . dapagliflozin  propanediol (FARXIGA ) 10 mg tablet Take 10 mg by mouth every morning before breakfast    . levothyroxine  (SYNTHROID ) 100 MCG tablet Take 100 mcg by mouth once daily    . metoprolol  SUCCinate (TOPROL -XL) 25 MG XL tablet Take 25 mg by mouth at bedtime    . semaglutide  (OZEMPIC ) 2 mg/dose (8 mg/3 mL) pen injector Inject 2 mg as directed once a week    . telmisartan  (MICARDIS ) 80 MG tablet Take 80 mg by mouth once daily    . aspirin 81 MG EC tablet Take 81 mg by mouth once daily. (Patient not taking: Reported on 04/23/2024)    . CALCIUM  ORAL Take by mouth once daily. (Patient not taking: Reported on 04/23/2024)    . lisinopril  (PRINIVIL ,ZESTRIL ) 10 MG tablet Take 10 mg by mouth once daily. (Patient not taking: Reported on 04/23/2024)    . triamcinolone 0.1 % cream Apply topically as directed. (Patient not taking: Reported on 04/23/2024)     No facility-administered encounter medications on file as of 04/23/2024.     ALLERGIES:   Patient has no known allergies.   SOCIAL HISTORY:  Social History   Socioeconomic History  . Marital status: Unknown  Tobacco Use  . Smoking status: Never  Vaping Use  . Vaping status: Never Used  Substance and Sexual Activity  . Alcohol use: No  . Drug use: No   Social Drivers of Corporate Investment Banker Strain: Low Risk  (04/23/2024)  Overall Physicist, Medical Strain (CARDIA)   . Difficulty of Paying Living Expenses: Not hard at all  Food Insecurity: No Food Insecurity (04/23/2024)   Hunger Vital Sign   . Worried About Programme Researcher, Broadcasting/film/video in the Last Year: Never true   . Ran Out of Food in the Last Year: Never true  Transportation Needs: No Transportation Needs (04/23/2024)   PRAPARE - Transportation   . Lack of Transportation (Medical): No   . Lack of Transportation (Non-Medical): No    FAMILY HISTORY:  Family History  Problem Relation Name Age of Onset  . High blood pressure  (Hypertension) Mother    . Diabetes Mother    . Hyperlipidemia (Elevated cholesterol) Mother    . High blood pressure (Hypertension) Father    . Colon polyps Sister    . Breast cancer Sister    . High blood pressure (Hypertension) Brother    . Hyperlipidemia (Elevated cholesterol) Brother    . Diabetes Brother       GENERAL REVIEW OF SYSTEMS:   General ROS: negative for - chills, fatigue, fever, weight gain or weight loss Allergy and Immunology ROS: negative for - hives  Hematological and Lymphatic ROS: negative for - bleeding problems or bruising, negative for palpable nodes Endocrine ROS: negative for - heat or cold intolerance, hair changes Respiratory ROS: negative for - cough, shortness of breath or wheezing Cardiovascular ROS: no chest pain or palpitations GI ROS: negative for nausea, vomiting, abdominal pain, diarrhea, constipation Musculoskeletal ROS: negative for - joint swelling or muscle pain Neurological ROS: negative for - confusion, syncope Dermatological ROS: negative for pruritus and rash  PHYSICAL EXAM:  Vitals:   04/23/24 1546  BP: (!) 147/77  Pulse: 90  .  Ht:165.1 cm (5' 5) Wt:83.9 kg (185 lb) ADJ:Anib surface area is 1.96 meters squared. Body mass index is 30.79 kg/m.Marissa Rodriguez   GENERAL: Alert, active, oriented x3  HEENT: Pupils equal reactive to light. Extraocular movements are intact. Sclera clear. Palpebral conjunctiva normal red color.Pharynx clear.  NECK: Supple with no palpable mass and no adenopathy.  LUNGS: Sound clear with no rales rhonchi or wheezes.  HEART: Regular rhythm S1 and S2 without murmur.  ABDOMEN: Soft and depressible, nontender with no palpable mass, no hepatomegaly. Wounds dry and clean.  EXTREMITIES: Well-developed well-nourished symmetrical with no dependent edema.  NEUROLOGICAL: Awake alert oriented, facial expression symmetrical, moving all extremities.   Results RADIOLOGY Mammogram: Indeterminate left breast calcification  spanning 14 mm (04/09/2024)  PATHOLOGY Stereotactic biopsy: Ductal carcinoma in situ, intermediate nuclear grade, ER positive (04/14/2024)    Assessment & Plan Ductal carcinoma in situ of left breast   Ductal carcinoma in situ (DCIS) of the left breast was confirmed by stereotactic biopsy on April 14, 2024. The 14 mm lesion is ER positive and classified as intermediate nuclear grade, stage 0, indicating confinement within the ducts. A partial mastectomy is recommended to excise the calcifications with at least 2 mm of normal breast tissue margins. Intraoperative assessment will be performed to evaluate margins. A radiofrequency tag will guide the surgical excision. Post-operative care includes pain management with ice packs, Tylenol , or ibuprofen, and stronger medication if needed. She should wear a sports bra for compression, monitor for fluid accumulation, and can shower normally with skin glue protecting the incision. Same-day discharge is planned with an expected recovery period of approximately two weeks.  Patient will follow-up by medical oncology for discussion of adjuvant radiation therapy and adjuvant endocrine therapy.   Ductal carcinoma in  situ (DCIS) of left breast [D05.12]          Patient and her sister verbalized understanding, all questions were answered, and were agreeable with the plan outlined above.   Lucas Sjogren, MD  Electronically signed by Lucas Sjogren, MD

## 2024-04-24 ENCOUNTER — Other Ambulatory Visit: Payer: Self-pay | Admitting: General Surgery

## 2024-04-24 ENCOUNTER — Encounter: Payer: Self-pay | Admitting: *Deleted

## 2024-04-24 ENCOUNTER — Telehealth: Payer: Self-pay | Admitting: Oncology

## 2024-04-24 DIAGNOSIS — D0512 Intraductal carcinoma in situ of left breast: Secondary | ICD-10-CM

## 2024-04-24 NOTE — Addendum Note (Signed)
 Addended by: VERDENE GILLS on: 04/24/2024 12:01 PM   Modules accepted: Orders

## 2024-04-24 NOTE — Telephone Encounter (Signed)
 Per inbasket from [facility], schedule pt with Dr.Chrystal and Dr.Yu and Deland the week on 11/24.  Dr.Chrystal had an opening on 11/24 at 10:30am.  I have scheduled Dr.Yu at 11am.   Deland does not work this week, I have scheduled appt for 12/3.   I called and spoke with pt and confirmed all of the appts above.

## 2024-04-24 NOTE — Progress Notes (Addendum)
 Surgery scheduled for 11/7. Schedule message sent to schedule pt for follow up the week of 11/24. Referrals placed. Scheduling will call pt with appt once scheduled.

## 2024-04-28 ENCOUNTER — Ambulatory Visit
Admission: RE | Admit: 2024-04-28 | Discharge: 2024-04-28 | Disposition: A | Discharge status: Home / Self Care | Source: Ambulatory Visit | Attending: General Surgery | Admitting: General Surgery

## 2024-04-28 DIAGNOSIS — D0512 Intraductal carcinoma in situ of left breast: Secondary | ICD-10-CM | POA: Diagnosis present

## 2024-04-28 MED ORDER — LIDOCAINE HCL 1 % IJ SOLN
10.0000 mL | Freq: Once | INTRAMUSCULAR | Status: AC
  Administered 2024-04-28: 10 mL
  Filled 2024-04-28: qty 10

## 2024-04-28 NOTE — Telephone Encounter (Signed)
 Received provider portion PAP application [employer] .

## 2024-04-29 ENCOUNTER — Other Ambulatory Visit: Payer: Self-pay

## 2024-04-29 ENCOUNTER — Encounter
Admission: RE | Admit: 2024-04-29 | Discharge: 2024-04-29 | Disposition: A | Discharge status: Home / Self Care | Source: Ambulatory Visit | Attending: General Surgery | Admitting: General Surgery

## 2024-04-29 VITALS — Ht 65.0 in | Wt 185.0 lb

## 2024-04-29 DIAGNOSIS — Z01810 Encounter for preprocedural cardiovascular examination: Secondary | ICD-10-CM

## 2024-04-29 DIAGNOSIS — I10 Essential (primary) hypertension: Secondary | ICD-10-CM

## 2024-04-29 DIAGNOSIS — Z01812 Encounter for preprocedural laboratory examination: Secondary | ICD-10-CM

## 2024-04-29 DIAGNOSIS — E1122 Type 2 diabetes mellitus with diabetic chronic kidney disease: Secondary | ICD-10-CM

## 2024-04-29 HISTORY — DX: Polyp of colon: K63.5

## 2024-04-29 NOTE — Patient Instructions (Addendum)
 Your procedure is scheduled on:  FRIDAY NOVEMBER 7  Report to the Registration Desk on the 1st floor of the [facility] To find out your arrival time, please call [phone] between 1PM - 3PM on:  THURSDAY  NOVEMBER 6  If your arrival time is 6:00 am, do not arrive before that time as the [facility] entrance doors do not open until 6:00 am.  REMEMBER: Instructions that are not followed completely may result in serious medical risk, up to and including death; or upon the discretion of your surgeon and anesthesiologist your surgery may need to be rescheduled.  Do not eat food after midnight the night before surgery.  No gum chewing or hard candies.  One week prior to surgery: Stop Anti-inflammatories (NSAIDS) such as Advil, Aleve, Ibuprofen, Motrin, Naproxen, Naprosyn and Aspirin based products such as Excedrin, Goody's Powder, BC Powder. Stop ANY OVER THE COUNTER supplements until after surgery. cholecalciferol (VITAMIN D3)   You may however, continue to take Tylenol  if needed for pain up until the day of surgery.  **Follow guidelines for insulin and diabetes medications.** dapagliflozin  propanediol (FARXIGA ) hold 3 days prior, last dose MONDAY NOVEMBER 3  Semaglutide  hold 7 days prior to surgery, last dose SATURDAY NOVEMBER 1   Continue taking all of your other prescription medications up until the day of surgery.  ON THE DAY OF SURGERY ONLY TAKE THESE MEDICATIONS WITH SIPS OF WATER :  levothyroxine  (SYNTHROID )   No Alcohol for 24 hours before or after surgery.  Do not use any recreational drugs for at least a week (preferably 2 weeks) before your surgery.  Please be advised that the combination of cocaine and anesthesia may have negative outcomes, up to and including death. If you test positive for cocaine, your surgery will be cancelled.  On the morning of surgery brush your teeth with toothpaste and water , you may rinse your mouth with mouthwash if you wish. Do not  swallow any toothpaste or mouthwash.  Use CHG Soap as directed on instruction sheet.  Do not wear jewelry, make-up, hairpins, clips or nail polish.  For welded (permanent) jewelry: bracelets, anklets, waist bands, etc.  Please have this removed prior to surgery.  If it is not removed, there is a chance that hospital personnel will need to cut it off on the day of surgery.  Do not wear lotions, powders, or perfumes.   Do not shave body hair from the neck down 48 hours before surgery.  Contact lenses, hearing aids and dentures may not be worn into surgery.  Do not bring valuables to the hospital. [facility] is not responsible for any missing/lost belongings or valuables.   Notify your doctor if there is any change in your medical condition (cold, fever, infection).  Wear comfortable clothing (specific to your surgery type) to the hospital.  After surgery, you can help prevent lung complications by doing breathing exercises.  Take deep breaths and cough every 1-2 hours.   If you are being discharged the day of surgery, you will not be allowed to drive home. You will need a responsible individual to drive you home and stay with you for 24 hours after surgery.   If you are taking public transportation, you will need to have a responsible individual with you.  Please call the Pre-admissions Testing Dept. at [phone] if you have any questions about these instructions.  Surgery Visitation Policy:  Patients having surgery or a procedure may have two visitors.  Children under the age  of 16 must have an adult with them who is not the patient.  Merchandiser, Retail to address health-related social needs:  [email or web address]                                                                                                               Preparing for Surgery with CHLORHEXIDINE GLUCONATE (CHG) Soap  Chlorhexidine Gluconate (CHG) Soap  o An antiseptic cleaner  that kills germs and bonds with the skin to continue killing germs even after washing  o Used for showering the night before surgery and morning of surgery  Before surgery, you can play an important role by reducing the number of germs on your skin.  CHG (Chlorhexidine gluconate) soap is an antiseptic cleanser which kills germs and bonds with the skin to continue killing germs even after washing.  Please do not use if you have an allergy to CHG or antibacterial soaps. If your skin becomes reddened/irritated stop using the CHG.  1. Shower the NIGHT BEFORE SURGERY with CHG soap.  2. If you choose to wash your hair, wash your hair first as usual with your normal shampoo.  3. After shampooing, rinse your hair and body thoroughly to remove the shampoo.  4. Use CHG as you would any other liquid soap. You can apply CHG directly to the skin and wash gently with a clean washcloth.  5. Apply the CHG soap to your body only from the neck down. Do not use on open wounds or open sores. Avoid contact with your eyes, ears, mouth, and genitals (private parts). Wash face and genitals (private parts) with your normal soap.  6. Wash thoroughly, paying special attention to the area where your surgery will be performed.  7. Thoroughly rinse your body with warm water .  8. Do not shower/wash with your normal soap after using and rinsing off the CHG soap.  9. Do not use lotions, oils, etc., after showering with CHG.  10. Pat yourself dry with a clean towel.  11. Wear clean pajamas to bed the night before surgery.  12. Place clean sheets on your bed the night of your shower and do not sleep with pets.  13. Do not apply any deodorants/lotions/powders.  14. Please wear clean clothes to the hospital.  15. Remember to brush your teeth with your regular toothpaste.

## 2024-04-30 MED ORDER — SODIUM CHLORIDE 0.9 % IV SOLN: INTRAVENOUS | Status: DC

## 2024-04-30 MED ORDER — CEFAZOLIN SODIUM-DEXTROSE 2-4 GM/100ML-% IV SOLN
2.0000 g | INTRAVENOUS | Status: AC
  Administered 2024-05-01: 2 g via INTRAVENOUS

## 2024-04-30 MED ORDER — CHLORHEXIDINE GLUCONATE 0.12 % MT SOLN
15.0000 mL | Freq: Once | OROMUCOSAL | Status: AC
  Administered 2024-05-01: 15 mL via OROMUCOSAL

## 2024-04-30 MED ORDER — ORAL CARE MOUTH RINSE: 15.0000 mL | Freq: Once | OROMUCOSAL | Status: AC

## 2024-04-30 NOTE — Telephone Encounter (Signed)
 PAP: Application for Marcelline Deist has been submitted to [employer] ([employer]), via fax

## 2024-05-01 ENCOUNTER — Other Ambulatory Visit: Payer: Self-pay

## 2024-05-01 ENCOUNTER — Encounter: Payer: Self-pay | Admitting: General Surgery

## 2024-05-01 ENCOUNTER — Ambulatory Visit
Admission: RE | Admit: 2024-05-01 | Discharge: 2024-05-01 | Disposition: A | Discharge status: Home / Self Care | Attending: General Surgery | Admitting: General Surgery

## 2024-05-01 ENCOUNTER — Ambulatory Visit
Admission: RE | Admit: 2024-05-01 | Discharge: 2024-05-01 | Disposition: A | Discharge status: Home / Self Care | Source: Ambulatory Visit | Attending: General Surgery | Admitting: General Surgery

## 2024-05-01 ENCOUNTER — Ambulatory Visit

## 2024-05-01 ENCOUNTER — Encounter: Payer: Self-pay | Admitting: Urgent Care

## 2024-05-01 ENCOUNTER — Encounter: Admission: RE | Disposition: A | Payer: Self-pay | Attending: General Surgery

## 2024-05-01 DIAGNOSIS — Z79899 Other long term (current) drug therapy: Secondary | ICD-10-CM | POA: Insufficient documentation

## 2024-05-01 DIAGNOSIS — Z7985 Long-term (current) use of injectable non-insulin antidiabetic drugs: Secondary | ICD-10-CM | POA: Insufficient documentation

## 2024-05-01 DIAGNOSIS — D0512 Intraductal carcinoma in situ of left breast: Secondary | ICD-10-CM

## 2024-05-01 DIAGNOSIS — Z7984 Long term (current) use of oral hypoglycemic drugs: Secondary | ICD-10-CM | POA: Insufficient documentation

## 2024-05-01 DIAGNOSIS — E1122 Type 2 diabetes mellitus with diabetic chronic kidney disease: Secondary | ICD-10-CM

## 2024-05-01 DIAGNOSIS — Z01810 Encounter for preprocedural cardiovascular examination: Secondary | ICD-10-CM | POA: Diagnosis not present

## 2024-05-01 DIAGNOSIS — N6082 Other benign mammary dysplasias of left breast: Secondary | ICD-10-CM | POA: Insufficient documentation

## 2024-05-01 DIAGNOSIS — Z170 Estrogen receptor positive status [ER+]: Secondary | ICD-10-CM | POA: Diagnosis not present

## 2024-05-01 DIAGNOSIS — E785 Hyperlipidemia, unspecified: Secondary | ICD-10-CM | POA: Diagnosis not present

## 2024-05-01 DIAGNOSIS — E039 Hypothyroidism, unspecified: Secondary | ICD-10-CM | POA: Insufficient documentation

## 2024-05-01 DIAGNOSIS — Z01812 Encounter for preprocedural laboratory examination: Secondary | ICD-10-CM

## 2024-05-01 DIAGNOSIS — N6489 Other specified disorders of breast: Secondary | ICD-10-CM | POA: Insufficient documentation

## 2024-05-01 DIAGNOSIS — Z6830 Body mass index (BMI) 30.0-30.9, adult: Secondary | ICD-10-CM | POA: Insufficient documentation

## 2024-05-01 DIAGNOSIS — I10 Essential (primary) hypertension: Secondary | ICD-10-CM | POA: Insufficient documentation

## 2024-05-01 DIAGNOSIS — E66813 Obesity, class 3: Secondary | ICD-10-CM | POA: Diagnosis not present

## 2024-05-01 DIAGNOSIS — N6092 Unspecified benign mammary dysplasia of left breast: Secondary | ICD-10-CM | POA: Insufficient documentation

## 2024-05-01 DIAGNOSIS — K7460 Unspecified cirrhosis of liver: Secondary | ICD-10-CM | POA: Diagnosis not present

## 2024-05-01 DIAGNOSIS — E119 Type 2 diabetes mellitus without complications: Secondary | ICD-10-CM | POA: Insufficient documentation

## 2024-05-01 LAB — GLUCOSE, CAPILLARY
Glucose-Capillary: 113 mg/dL — ABNORMAL HIGH (ref 70–99)
Glucose-Capillary: 131 mg/dL — ABNORMAL HIGH (ref 70–99)

## 2024-05-01 MED ORDER — LIDOCAINE HCL (PF) 2 % IJ SOLN
INTRAMUSCULAR | Status: AC
  Filled 2024-05-01: qty 5

## 2024-05-01 MED ORDER — FENTANYL CITRATE (PF) 100 MCG/2ML IJ SOLN
INTRAMUSCULAR | Status: DC | PRN
  Administered 2024-05-01 (×2): 25 ug via INTRAVENOUS
  Administered 2024-05-01: 50 ug via INTRAVENOUS
  Administered 2024-05-01 (×2): 25 ug via INTRAVENOUS

## 2024-05-01 MED ORDER — ACETAMINOPHEN 10 MG/ML IV SOLN
INTRAVENOUS | Status: DC | PRN
  Administered 2024-05-01: 1000 mg via INTRAVENOUS

## 2024-05-01 MED ORDER — BUPIVACAINE-EPINEPHRINE (PF) 0.5% -1:200000 IJ SOLN
INTRAMUSCULAR | Status: DC | PRN
  Administered 2024-05-01: 6 mL

## 2024-05-01 MED ORDER — PROPOFOL 10 MG/ML IV BOLUS
INTRAVENOUS | Status: AC
  Filled 2024-05-01: qty 20

## 2024-05-01 MED ORDER — HYDROCODONE-ACETAMINOPHEN 5-325 MG PO TABS
1.0000 | ORAL_TABLET | Freq: Four times a day (QID) | ORAL | 0 refills | Status: AC | PRN
  Filled 2024-05-01: qty 12, 3d supply, fill #0

## 2024-05-01 MED ORDER — OXYCODONE HCL 5 MG PO TABS
5.0000 mg | ORAL_TABLET | Freq: Once | ORAL | Status: AC | PRN
  Administered 2024-05-01: 5 mg via ORAL

## 2024-05-01 MED ORDER — ONDANSETRON HCL 4 MG/2ML IJ SOLN: 4.0000 mg | Freq: Once | INTRAMUSCULAR | Status: DC | PRN

## 2024-05-01 MED ORDER — ONDANSETRON HCL 4 MG/2ML IJ SOLN
INTRAMUSCULAR | Status: AC
  Filled 2024-05-01: qty 2

## 2024-05-01 MED ORDER — FENTANYL CITRATE (PF) 100 MCG/2ML IJ SOLN: 25.0000 ug | INTRAMUSCULAR | Status: DC | PRN

## 2024-05-01 MED ORDER — OXYCODONE HCL 5 MG/5ML PO SOLN: 5.0000 mg | Freq: Once | ORAL | Status: AC | PRN

## 2024-05-01 MED ORDER — CHLORHEXIDINE GLUCONATE 0.12 % MT SOLN
OROMUCOSAL | Status: AC
  Filled 2024-05-01: qty 15

## 2024-05-01 MED ORDER — DEXMEDETOMIDINE HCL IN NACL 80 MCG/20ML IV SOLN
INTRAVENOUS | Status: DC | PRN
  Administered 2024-05-01: 4 ug via INTRAVENOUS

## 2024-05-01 MED ORDER — ONDANSETRON HCL 4 MG/2ML IJ SOLN
INTRAMUSCULAR | Status: DC | PRN
  Administered 2024-05-01: 4 mg via INTRAVENOUS

## 2024-05-01 MED ORDER — CEFAZOLIN SODIUM-DEXTROSE 2-4 GM/100ML-% IV SOLN
INTRAVENOUS | Status: AC
  Filled 2024-05-01: qty 100

## 2024-05-01 MED ORDER — DEXAMETHASONE SOD PHOSPHATE PF 10 MG/ML IJ SOLN
INTRAMUSCULAR | Status: DC | PRN
  Administered 2024-05-01: 4 mg via INTRAVENOUS

## 2024-05-01 MED ORDER — EPHEDRINE SULFATE-NACL 50-0.9 MG/10ML-% IV SOSY
PREFILLED_SYRINGE | INTRAVENOUS | Status: DC | PRN
  Administered 2024-05-01: 5 mg via INTRAVENOUS

## 2024-05-01 MED ORDER — FENTANYL CITRATE (PF) 100 MCG/2ML IJ SOLN
INTRAMUSCULAR | Status: AC
  Filled 2024-05-01: qty 2

## 2024-05-01 MED ORDER — BUPIVACAINE-EPINEPHRINE (PF) 0.5% -1:200000 IJ SOLN
INTRAMUSCULAR | Status: AC
  Filled 2024-05-01: qty 30

## 2024-05-01 MED ORDER — LIDOCAINE HCL (PF) 2 % IJ SOLN
INTRAMUSCULAR | Status: DC | PRN
  Administered 2024-05-01: 100 mg via INTRADERMAL

## 2024-05-01 MED ORDER — OXYCODONE HCL 5 MG PO TABS
ORAL_TABLET | ORAL | Status: AC
  Filled 2024-05-01: qty 1

## 2024-05-01 MED ORDER — PROPOFOL 10 MG/ML IV BOLUS
INTRAVENOUS | Status: DC | PRN
  Administered 2024-05-01: 120 mg via INTRAVENOUS

## 2024-05-01 SURGICAL SUPPLY — 1 items: NDL HYPO 22X1.5 SAFETY MO (MISCELLANEOUS) ×1 IMPLANT

## 2024-05-01 NOTE — Discharge Instructions (Addendum)

## 2024-05-01 NOTE — Anesthesia Postprocedure Evaluation (Signed)
 Anesthesia Post Note  Patient: Marissa Rodriguez  Procedure(s) Performed: BREAST LUMPECTOMY WITH RADIO FREQUENCY LOCALIZER (Left: Breast)  Patient location during evaluation: PACU Anesthesia Type: General Level of consciousness: awake and alert Pain management: satisfactory to patient Vital Signs Assessment: post-procedure vital signs reviewed and stable Respiratory status: spontaneous breathing Cardiovascular status: blood pressure returned to baseline Anesthetic complications: no   No notable events documented.   Last Vitals:  Vitals:   05/01/24 1215 05/01/24 1230  BP: (!) 143/62 (!) 163/75  Pulse: 70 66  Resp: 18 17  Temp:  (!) 36.4 C  SpO2: 94% 95%    Last Pain:  Vitals:   05/01/24 1230  TempSrc: Temporal  PainSc: 0-No pain                 VAN STAVEREN,Thomasine Klutts

## 2024-05-01 NOTE — Interval H&P Note (Signed)
 History and Physical Interval Note:  05/01/2024 9:57 AM  Marissa Rodriguez  has presented today for surgery, with the diagnosis of Ductal carcinoma in situ of left breast.  The various methods of treatment have been discussed with the patient and family. After consideration of risks, benefits and other options for treatment, the patient has consented to  Procedure(s): BREAST LUMPECTOMY WITH RADIO FREQUENCY LOCALIZER (Left) as a surgical intervention.  The patient's history has been reviewed, patient examined, no change in status, stable for surgery.  I have reviewed the patient's chart and labs.  Questions were answered to the patient's satisfaction.     Lucas Sjogren

## 2024-05-01 NOTE — Op Note (Signed)
 Preoperative diagnosis: Left breast ductal carcinoma in situ.  Postoperative diagnosis: Same.   Procedure: Left radiofrequency tag-localized partial mastectomy.                       Anesthesia: GETA  Surgeon: Dr. Cesar Coe  Wound Classification: Clean  Indications: Patient is a 83 y.o. female with a nonpalpable left breast mass noted on mammography with core biopsy demonstrating DCIS requires radiofrequency tag-localized partial mastectomy for treatment.   Findings: 1. Specimen mammography shows marker and tag on specimen 2. Pathology call refers gross examination of margins was fibrotic changes close to posterior margin 3. No other palpable mass or lymph node identified.   Description of procedure: Preoperative radiofrequency tag localization was performed by radiology. In the nuclear medicine suite, the subareolar region was injected with Tc-99 sulfur colloid. Localization studies were reviewed. The patient was taken to the operating room and placed supine on the operating table, and after general anesthesia the left chest and axilla were prepped and draped in the usual sterile fashion. A time-out was completed verifying correct patient, procedure, site, positioning, and implant(s) and/or special equipment prior to beginning this procedure.  By comparing the localization studies and interrogation with [facility] device, the probable trajectory and location of the mass was visualized. A circumareolar skin incision was planned in such a way as to minimize the amount of dissection to reach the mass.  The skin incision was made. Flaps were raised and the location of the tag was confirmed with [facility] device confirmed. A 2-0 silk figure-of-eight stay suture was placed and used for retraction. Dissection was then taken down circumferentially, taking care to include the entire localizing tag and a wide margin of grossly normal tissue. The specimen and entire localizing tag were removed. The  specimen was oriented and sent to radiology with the localization studies. Pathology reported fibrotic changes close to posterior margin. Re excision of posterior margin performed. The wound was irrigated. Hemostasis was checked.  The breast wound was then approached for closure.  Eliminate dead space a tissue transfer technique was utilized.  The breast and pectoralis fascia was elevated off the underlying muscle and the serratus muscle circumferentially for a distance of about 6 centimeters (36 sq cm).  The fascial layer was then approximated with interrupted 2-0 Vicryl sutures.  The superficial layer of the breast parenchyma was then approximated in a similar fashion.  This was done in a radial direction.  The skin flaps were then elevated circumferentially to remove a ripple noted superiorly and medially. The skin was closed with 4-0 Monocryl. Dermabond was applied.  Specimen: Left Breast mass                     Re excision of posterior margin  Complications: None  Estimated Blood Loss: 10 mL

## 2024-05-01 NOTE — Transfer of Care (Signed)
 Immediate Anesthesia Transfer of Care Note  Patient: Marissa Rodriguez  Procedure(s) Performed: BREAST LUMPECTOMY WITH RADIO FREQUENCY LOCALIZER (Left: Breast)  Patient Location: PACU  Anesthesia Type:General  Level of Consciousness: drowsy  Airway & Oxygen Therapy: Patient Spontanous Breathing and Patient connected to face mask oxygen  Post-op Assessment: Report given to RN and Post -op Vital signs reviewed and stable  Post vital signs: Reviewed and stable  Last Vitals:  Vitals Value Taken Time  BP 129/61   Temp    Pulse 57 05/01/24 11:33  Resp 10 05/01/24 11:33  SpO2 99 % 05/01/24 11:33  Vitals shown include unfiled device data.  Last Pain:  Vitals:   05/01/24 0844  TempSrc: Temporal  PainSc: 0-No pain         Complications: No notable events documented.

## 2024-05-01 NOTE — Anesthesia Preprocedure Evaluation (Addendum)
 Anesthesia Evaluation  Patient identified by MRN, date of birth, ID band Patient awake    Reviewed: Allergy & Precautions, NPO status , Patient's Chart, lab work & pertinent test results  Airway Mallampati: III  TM Distance: >3 FB Neck ROM: full    Dental  (+) Partial Upper   Pulmonary neg pulmonary ROS   Pulmonary exam normal        Cardiovascular Exercise Tolerance: Good hypertension, Pt. on medications Normal cardiovascular exam Rhythm:Regular Rate:Normal     Neuro/Psych negative neurological ROS  negative psych ROS   GI/Hepatic negative GI ROS,,,(+) Cirrhosis         Endo/Other  diabetes, Type 2, Oral Hypoglycemic AgentsHypothyroidism  Class 3 obesity  Renal/GU   negative genitourinary   Musculoskeletal negative musculoskeletal ROS (+)    Abdominal  (+) + obese  Peds  Hematology negative hematology ROS (+)   Anesthesia Other Findings Past Medical History: 09/24/2015: Closed fracture of part of upper end of humerus No date: Diabetes mellitus without complication (HCC) 04/16/2024: Ductal carcinoma in situ of breast 10/2010: History of MRI of brain and brain stem     Comment:  normal MRI/MRA done to rule out CVA 01/16/2021: Hyperkalemia No date: Hyperlipidemia No date: Hypertension No date: Hypothyroidism 11/09/2022: Liver cirrhosis secondary to NASH (nonalcoholic  steatohepatitis) (HCC) No date: Polyp of transverse colon 10/14/2011: Secondary esophageal varices without bleeding (HCC) 04/2009: Tubular adenoma of colon     Comment:  repeat due 2015 No date: Tubular adenoma of colon 12/10/2012: Type 2 diabetes mellitus with stage 3 chronic kidney  disease (HCC)  Past Surgical History: 06/25/1973: ABDOMINAL HYSTERECTOMY     Comment:  TAH/LSO, wedge resection of right 06/25/1973: APPENDECTOMY     Comment:  incidental, done during TAH 04/14/2024: BREAST BIOPSY; Left     Comment:  stereo calcs coil  path pending 04/14/2024: BREAST BIOPSY; Left     Comment:  MM LT BREAST BX W LOC DEV 1ST LESION IMAGE BX SPEC               STEREO GUIDE 04/14/2024 [facility] 04/28/2024: BREAST BIOPSY     Comment:  MM LT BREAST SAVI/RF TAG 1ST LESION MAMMO GUIDE               04/28/2024 [facility] 03/19/2019: COLONOSCOPY WITH PROPOFOL ; N/A     Comment:  Procedure: COLONOSCOPY WITH BIOPSIES;  Surgeon: Jinny Carmine, MD;  Location: [facility];  Service:               Endoscopy;  Laterality: N/A;  Diabetic 03/19/2019: POLYPECTOMY; N/A     Comment:  Procedure: POLYPECTOMY;  Surgeon: Jinny Carmine, MD;                Location: [facility];  Service: Endoscopy;                Laterality: N/A;  BMI    Body Mass Index: 30.79 kg/m      Reproductive/Obstetrics negative OB ROS                              Anesthesia Physical Anesthesia Plan  ASA: 3  Anesthesia Plan: General   Post-op Pain Management:    Induction: Intravenous  PONV Risk Score and Plan: Dexamethasone, Ondansetron , Midazolam and Treatment may vary due to age or medical condition  Airway Management Planned:  LMA  Additional Equipment:   Intra-op Plan:   Post-operative Plan: Extubation in OR  Informed Consent: I have reviewed the patients History and Physical, chart, labs and discussed the procedure including the risks, benefits and alternatives for the proposed anesthesia with the patient or authorized representative who has indicated his/her understanding and acceptance.     Dental Advisory Given  Plan Discussed with: CRNA  Anesthesia Plan Comments:          Anesthesia Quick Evaluation

## 2024-05-01 NOTE — Anesthesia Procedure Notes (Signed)
 Procedure Name: LMA Insertion Date/Time: 05/01/2024 10:07 AM  Performed by: Myra Lawless, CRNAPre-anesthesia Checklist: Patient identified, Patient being monitored, Timeout performed, Emergency Drugs available and Suction available Patient Re-evaluated:Patient Re-evaluated prior to induction Oxygen Delivery Method: Circle system utilized Preoxygenation: Pre-oxygenation with 100% oxygen Induction Type: IV induction Ventilation: Mask ventilation without difficulty LMA: LMA inserted LMA Size: 4.0 Tube type: Oral Number of attempts: 1 Placement Confirmation: positive ETCO2 and breath sounds checked- equal and bilateral Tube secured with: Tape Dental Injury: Teeth and Oropharynx as per pre-operative assessment

## 2024-05-02 ENCOUNTER — Encounter: Payer: Self-pay | Admitting: General Surgery

## 2024-05-04 ENCOUNTER — Other Ambulatory Visit: Payer: Self-pay | Admitting: Pathology

## 2024-05-04 LAB — SURGICAL PATHOLOGY

## 2024-05-05 ENCOUNTER — Encounter: Payer: Self-pay | Admitting: *Deleted

## 2024-05-09 ENCOUNTER — Other Ambulatory Visit: Payer: Self-pay | Admitting: Internal Medicine

## 2024-05-09 DIAGNOSIS — E034 Atrophy of thyroid (acquired): Secondary | ICD-10-CM

## 2024-05-18 ENCOUNTER — Ambulatory Visit
Admission: RE | Admit: 2024-05-18 | Discharge: 2024-05-18 | Disposition: A | Discharge status: Home / Self Care | Source: Ambulatory Visit | Attending: Radiation Oncology | Admitting: Radiation Oncology

## 2024-05-18 ENCOUNTER — Encounter: Payer: Self-pay | Admitting: Radiation Oncology

## 2024-05-18 ENCOUNTER — Encounter: Payer: Self-pay | Admitting: Oncology

## 2024-05-18 ENCOUNTER — Inpatient Hospital Stay: Attending: Oncology | Admitting: Oncology

## 2024-05-18 ENCOUNTER — Inpatient Hospital Stay

## 2024-05-18 VITALS — BP 157/83 | HR 72 | Temp 97.6°F | Resp 20 | Wt 192.0 lb

## 2024-05-18 VITALS — BP 125/66 | HR 71 | Temp 97.4°F | Ht 65.0 in | Wt 192.0 lb

## 2024-05-18 DIAGNOSIS — N183 Chronic kidney disease, stage 3 unspecified: Secondary | ICD-10-CM | POA: Insufficient documentation

## 2024-05-18 DIAGNOSIS — Z860100 Personal history of colon polyps, unspecified: Secondary | ICD-10-CM | POA: Insufficient documentation

## 2024-05-18 DIAGNOSIS — Z79890 Hormone replacement therapy (postmenopausal): Secondary | ICD-10-CM | POA: Insufficient documentation

## 2024-05-18 DIAGNOSIS — Z803 Family history of malignant neoplasm of breast: Secondary | ICD-10-CM | POA: Insufficient documentation

## 2024-05-18 DIAGNOSIS — Z7985 Long-term (current) use of injectable non-insulin antidiabetic drugs: Secondary | ICD-10-CM | POA: Insufficient documentation

## 2024-05-18 DIAGNOSIS — Z7984 Long term (current) use of oral hypoglycemic drugs: Secondary | ICD-10-CM | POA: Insufficient documentation

## 2024-05-18 DIAGNOSIS — E119 Type 2 diabetes mellitus without complications: Secondary | ICD-10-CM | POA: Insufficient documentation

## 2024-05-18 DIAGNOSIS — Z170 Estrogen receptor positive status [ER+]: Secondary | ICD-10-CM | POA: Insufficient documentation

## 2024-05-18 DIAGNOSIS — E039 Hypothyroidism, unspecified: Secondary | ICD-10-CM | POA: Insufficient documentation

## 2024-05-18 DIAGNOSIS — D0512 Intraductal carcinoma in situ of left breast: Secondary | ICD-10-CM | POA: Diagnosis present

## 2024-05-18 DIAGNOSIS — Z79899 Other long term (current) drug therapy: Secondary | ICD-10-CM | POA: Insufficient documentation

## 2024-05-18 DIAGNOSIS — I10 Essential (primary) hypertension: Secondary | ICD-10-CM | POA: Insufficient documentation

## 2024-05-18 DIAGNOSIS — E785 Hyperlipidemia, unspecified: Secondary | ICD-10-CM | POA: Insufficient documentation

## 2024-05-18 DIAGNOSIS — D0511 Intraductal carcinoma in situ of right breast: Secondary | ICD-10-CM

## 2024-05-18 DIAGNOSIS — K7460 Unspecified cirrhosis of liver: Secondary | ICD-10-CM | POA: Insufficient documentation

## 2024-05-18 DIAGNOSIS — K7581 Nonalcoholic steatohepatitis (NASH): Secondary | ICD-10-CM | POA: Insufficient documentation

## 2024-05-18 NOTE — Assessment & Plan Note (Addendum)
 Pathology results were reviewed and discussed with patient DCIS, grade 2.  pTis, pNx ER positive. She has established care with radiation oncology for adjuvant radiation After that I recommend adjuvant endocrine therapy for 5 years.  I recommend to obtain bone density for evaluation of baseline bone health.

## 2024-05-18 NOTE — Assessment & Plan Note (Signed)
 Previously discussed option of genetic testing.   Patient declined.

## 2024-05-18 NOTE — Consult Note (Signed)
 NEW PATIENT EVALUATION  Name: Marissa Rodriguez  MRN: [id number]  Date:   05/18/2024     DOB: [birth year]   This 83 y.o. 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  REFERRING PHYSICIAN: Babara Call, MD  CHIEF COMPLAINT:  Chief Complaint  Patient presents with   Breast Cancer    DIAGNOSIS: The encounter diagnosis was Ductal carcinoma in situ (DCIS) of right breast.   PREVIOUS INVESTIGATIONS:  Pathology reports reviewed Clinical notes reviewed Mammogram and ultrasound reviewed  HPI: Patient is a 83 year old female who presented with an abnormal mammogram of her left breast.  There were indeterminate left breast calcifications spanning 1.4 cm warranting further Characterization with stereotactic biopsy.  Stereotactic biopsy was performed showing ductal carcinoma in situ ER positive.  She underwent a wide local excision showing DCIS ER positive measuring 11 mm.  There was central expansive commode necrosis noted.  Margins were negative at 6 mm.  No lymph nodes were submitted.  She has done well postoperatively.  Specifically denies breast tenderness cough or bone pain.  PLANNED TREATMENT REGIMEN: Left hypofractionated whole breast radiation  PAST MEDICAL HISTORY:  has a past medical history of Closed fracture of part of upper end of humerus (09/24/2015), Diabetes mellitus without complication (HCC), Ductal carcinoma in situ of breast (04/16/2024), History of MRI of brain and brain stem (10/2010), Hyperkalemia (01/16/2021), Hyperlipidemia, Hypertension, Hypothyroidism, Liver cirrhosis secondary to NASH (nonalcoholic steatohepatitis) (HCC) (11/09/2022), Polyp of transverse colon, Secondary esophageal varices without bleeding (HCC) (10/14/2011), Tubular adenoma of colon (04/2009), Tubular adenoma of colon, and Type 2 diabetes mellitus with stage 3 chronic kidney disease  (HCC) (12/10/2012).    PAST SURGICAL HISTORY:  Past Surgical History:  Procedure Laterality Date   ABDOMINAL HYSTERECTOMY  06/25/1973   TAH/LSO, wedge resection of right   APPENDECTOMY  06/25/1973   incidental, done during TAH   BREAST BIOPSY Left 04/14/2024   stereo calcs coil path pending   BREAST BIOPSY Left 04/14/2024   MM LT BREAST BX W LOC DEV 1ST LESION IMAGE BX SPEC STEREO GUIDE 04/14/2024 [facility]   BREAST BIOPSY  04/28/2024   MM LT BREAST SAVI/RF TAG 1ST LESION MAMMO GUIDE 04/28/2024 [facility]   BREAST LUMPECTOMY WITH RADIO FREQUENCY LOCALIZER Left 05/01/2024   Procedure: BREAST LUMPECTOMY WITH RADIO FREQUENCY LOCALIZER;  Surgeon: Rodolph Romano, MD;  Location: [facility];  Service: General;  Laterality: Left;   COLONOSCOPY WITH PROPOFOL  N/A 03/19/2019   Procedure: COLONOSCOPY WITH BIOPSIES;  Surgeon: Jinny Carmine, MD;  Location: [facility];  Service: Endoscopy;  Laterality: N/A;  Diabetic   POLYPECTOMY N/A 03/19/2019   Procedure: POLYPECTOMY;  Surgeon: Jinny Carmine, MD;  Location: [facility];  Service: Endoscopy;  Laterality: N/A;    FAMILY HISTORY: family history includes Breast cancer (age of onset: 38) in her sister; Cancer in her brother and father.  SOCIAL HISTORY:  reports that she has never smoked. She has never used smokeless tobacco. She reports that she does not drink alcohol and does not use drugs.  ALLERGIES: Patient has no known allergies.  MEDICATIONS:  Current Outpatient Medications  Medication Sig Dispense Refill   amLODipine  (NORVASC ) 5 MG tablet Take 1 tablet (5 mg total) by mouth daily. 90 tablet 1   atorvastatin  (LIPITOR) 40 MG tablet Take 1 tablet by mouth once daily 90 tablet 3   blood glucose meter kit and supplies  Dispense based on patient and insurance preference. Use to check blood sugars twice daily. (FOR ICD-10 E11.69). 1 each 0   cholecalciferol (VITAMIN D3) 25 MCG (1000 UNIT) tablet Take 1,000 Units by  mouth daily.     dapagliflozin  propanediol (FARXIGA ) 10 MG TABS tablet Take 1 tablet (10 mg total) by mouth daily before breakfast. 90 tablet 3   Lancets (ONETOUCH DELICA PLUS LANCET33G) MISC USE TO TEST BLOOD SUGAR TWICE DAILY     levothyroxine  (SYNTHROID ) 100 MCG tablet TAKE 1 TABLET BY MOUTH ONCE DAILY BEFORE BREAKFAST 90 tablet 3   metoprolol  succinate (TOPROL -XL) 25 MG 24 hr tablet Take 1 tablet (25 mg total) by mouth at bedtime. 90 tablet 1   ONETOUCH VERIO test strip USE 1 STRIP TO CHECK GLUCOSE THREE TIMES DAILY 200 each 0   Semaglutide , 2 MG/DOSE, 8 MG/3ML SOPN Inject 2 mg as directed once a week. 3 mL 0   telmisartan  (MICARDIS ) 80 MG tablet Take 1 tablet (80 mg total) by mouth daily. 90 tablet 1   No current facility-administered medications for this encounter.    ECOG PERFORMANCE STATUS:  0 - Asymptomatic  REVIEW OF SYSTEMS: Patient denies any weight loss, fatigue, weakness, fever, chills or night sweats. Patient denies any loss of vision, blurred vision. Patient denies any ringing  of the ears or hearing loss. No irregular heartbeat. Patient denies heart murmur or history of fainting. Patient denies any chest pain or pain radiating to her upper extremities. Patient denies any shortness of breath, difficulty breathing at night, cough or hemoptysis. Patient denies any swelling in the lower legs. Patient denies any nausea vomiting, vomiting of blood, or coffee ground material in the vomitus. Patient denies any stomach pain. Patient states has had normal bowel movements no significant constipation or diarrhea. Patient denies any dysuria, hematuria or significant nocturia. Patient denies any problems walking, swelling in the joints or loss of balance. Patient denies any skin changes, loss of hair or loss of weight. Patient denies any excessive worrying or anxiety or significant depression. Patient denies any problems with insomnia. Patient denies excessive thirst, polyuria, polydipsia. Patient  denies any swollen glands, patient denies easy bruising or easy bleeding. Patient denies any recent infections, allergies or URI. Patient s visual fields have not changed significantly in recent time.   PHYSICAL EXAM: BP (!) 157/83 Comment: monitors at home wnl  Pulse 72   Temp 97.6 F (36.4 C)   Resp 20   Wt 192 lb (87.1 kg)   BMI 31.95 kg/m  Patient status post wide local excision of the left breast.  No dominant masses noted in either breast.  No axillary or supraclavicular adenopathy is appreciated.  Well-developed well-nourished patient in NAD. HEENT reveals PERLA, EOMI, discs not visualized.  Oral cavity is clear. No oral mucosal lesions are identified. Neck is clear without evidence of cervical or supraclavicular adenopathy. Lungs are clear to A&P. Cardiac examination is essentially unremarkable with regular rate and rhythm without murmur rub or thrill. Abdomen is benign with no organomegaly or masses noted. Motor sensory and DTR levels are equal and symmetric in the upper and lower extremities. Cranial nerves II through XII are grossly intact. Proprioception is intact. No peripheral adenopathy or edema is identified. No motor or sensory levels are noted. Crude visual fields are within normal range.  LABORATORY DATA: Pathology reports reviewed    RADIOLOGY RESULTS: Mammograms reviewed compatible with above-stated findings   IMPRESSION: ER-positive DCIS of the left breast status post wide local excision  in 83 year old female  PLAN: At this time I have recommended hypofractionated course of whole breast radiation over 3 weeks.  Would also boost her scar another 1000 cGy using photons.  Risks and benefits of treatment including skin reaction fatigue alteration blood counts possible inclusion of superficial lung all were reviewed in detail with the patient.  She also be a candidate for endocrine therapy after completion of radiation.  I have personally set up and ordered CT simulation for  the middle of next week.  I would like to take this opportunity to thank you for allowing me to participate in the care of your patient.[name] Marcey Penton, MD

## 2024-05-18 NOTE — Progress Notes (Signed)
 Hematology/Oncology Consult Note [phone] [phone]     REFERRING PROVIDER: Marylynn Verneita CROME, MD    CHIEF COMPLAINTS/PURPOSE OF CONSULTATION:  Left breast DCIS  ASSESSMENT & PLAN:   Cancer Staging  Ductal carcinoma in situ of breast Staging form: Breast, AJCC 8th Edition - Clinical stage from 04/20/2024: Stage 0 (cTis (DCIS), cN0, cM0, G2, ER+, PR: Not Assessed, HER2: Not Assessed) - Signed by Babara Call, MD on 04/20/2024   Ductal carcinoma in situ of breast Pathology results were reviewed and discussed with patient DCIS, grade 2.  pTis, pNx ER positive. She has established care with radiation oncology for adjuvant radiation After that I recommend adjuvant endocrine therapy for 5 years.  I recommend to obtain bone density for evaluation of baseline bone health.  Family history of breast cancer Previously discussed option of genetic testing.   Patient declined.     Orders Placed This Encounter  Procedures   DG Bone Density    Standing Status:   Future    Expected Date:   07/18/2024    Expiration Date:   05/18/2025    Reason for Exam (SYMPTOM  OR DIAGNOSIS REQUIRED):   Breast Cancer    Preferred imaging location?:   [facility]   Follow-up to be determined.   All questions were answered. The patient knows to call the clinic with any problems, questions or concerns.  Call Babara, MD, PhD [facility] Hematology Oncology 05/18/2024    HISTORY OF PRESENTING ILLNESS:  Marissa Rodriguez 83 y.o. female presents to establish care for left breast DCIS I have reviewed her chart and materials related to her cancer extensively and collaborated history with the patient. Summary of oncologic history is as follows: Oncology History  Ductal carcinoma in situ of breast  03/20/2024 Mammogram   Bilateral breast screening mammogram showed calcifications in the outer left breast warrant further evaluation.   04/09/2024 Mammogram   Diagnostic left breast  mammogram showed Indeterminate left breast calcifications spanning 14 mm to warrant further characterization with a stereotactic biopsy.   04/16/2024 Initial Diagnosis   Ductal carcinoma in situ of breast  1. Breast, left, needle core biopsy, calcifications, upper outer quad, 14mm (coil clip)   - DUCTAL CARCINOMA IN SITU, INTERMEDIATE NUCLEAR GRADE, PAPILLARY AND CRIBRIFORM TYPES, WITH NECROSIS AND CALCIFICATIONS, 6 MM   ER 100% positive  Menarche at age of 82 First live birth at age of 46 OCP use: Less than 5 years History of hysterectomy: At age of 49. Menopausal status: Postmenopausal History of HRT use: No History of chest radiation: No Number of previous breast biopsies: No Sister with history of breast cancer.     04/20/2024 Cancer Staging   Staging form: Breast, AJCC 8th Edition - Clinical stage from 04/20/2024: Stage 0 (cTis (DCIS), cN0, cM0, G2, ER+, PR: Not Assessed, HER2: Not Assessed) - Signed by Babara Call, MD on 04/20/2024 Stage prefix: Initial diagnosis Histologic grading system: 3 grade system   05/01/2024 Surgery   Patient underwent left breast lumpectomy.' 1. Breast, lumpectomy, left mass :       - DUCTAL CARCINOMA IN SITU (DCIS).       - SEE CANCER SUMMARY BELOW.       - BIOPSY SITE CHANGE WITH COIL CLIP.       - COLUMNAR CELL CHANGE, APOCRINE METAPLASIA, AND USUAL DUCTAL HYPERPLASIA.       - CALCIFICATIONS ASSOCIATED WITH DCIS AND COLUMNAR CELL CHANGE.        2. Breast, excision, re-excision  of procedure margin, stitch inside cavity :  - FOCAL ATYPICAL DUCTAL HYPERPLASIA (ADH).       - SEE CANCER SUMMARY BELOW FOR FINAL MARGIN STATUS.   TUMOR  Histologic Type: Ductal carcinoma in situ  Size (Extent) of DCIS:  Estimated size (extent) of DCIS is at least 11 mm  Nuclear Grade: Grade II (intermediate)  Necrosis: Present, central (expansive comedo necrosis)   MARGINS  Margin Status: All margins negative for DCIS  Distance from DCIS to closest margin: 6  mm  Specify closest margin: Lateral   REGIONAL LYMPH NODES  Regional Lymph node Status: Not applicable (no regional lymph nodes submitted or  found)   DISTANT METASTASIS  Distant Site(s) Involved, if applicable (select all that apply): Not applicable    pTis pNx    Patient presents to discuss about the pathology results in the postsurgical management.  She reports feeling well.  No concern at lumpectomy site.  MEDICAL HISTORY:  Past Medical History:  Diagnosis Date   Closed fracture of part of upper end of humerus 09/24/2015   Diabetes mellitus without complication (HCC)    Ductal carcinoma in situ of breast 04/16/2024   History of MRI of brain and brain stem 10/2010   normal MRI/MRA done to rule out CVA   Hyperkalemia 01/16/2021   Hyperlipidemia    Hypertension    Hypothyroidism    Liver cirrhosis secondary to NASH (nonalcoholic steatohepatitis) (HCC) 11/09/2022   Polyp of transverse colon    Secondary esophageal varices without bleeding (HCC) 10/14/2011   Tubular adenoma of colon 04/2009   repeat due 2015   Tubular adenoma of colon    Type 2 diabetes mellitus with stage 3 chronic kidney disease (HCC) 12/10/2012    SURGICAL HISTORY: Past Surgical History:  Procedure Laterality Date   ABDOMINAL HYSTERECTOMY  06/25/1973   TAH/LSO, wedge resection of right   APPENDECTOMY  06/25/1973   incidental, done during TAH   BREAST BIOPSY Left 04/14/2024   stereo calcs coil path pending   BREAST BIOPSY Left 04/14/2024   MM LT BREAST BX W LOC DEV 1ST LESION IMAGE BX SPEC STEREO GUIDE 04/14/2024 [facility]   BREAST BIOPSY  04/28/2024   MM LT BREAST SAVI/RF TAG 1ST LESION MAMMO GUIDE 04/28/2024 [facility]   BREAST LUMPECTOMY WITH RADIO FREQUENCY LOCALIZER Left 05/01/2024   Procedure: BREAST LUMPECTOMY WITH RADIO FREQUENCY LOCALIZER;  Surgeon: Rodolph Romano, MD;  Location: [facility];  Service: General;  Laterality: Left;   COLONOSCOPY WITH PROPOFOL  N/A 03/19/2019    Procedure: COLONOSCOPY WITH BIOPSIES;  Surgeon: Jinny Carmine, MD;  Location: [facility];  Service: Endoscopy;  Laterality: N/A;  Diabetic   POLYPECTOMY N/A 03/19/2019   Procedure: POLYPECTOMY;  Surgeon: Jinny Carmine, MD;  Location: [facility];  Service: Endoscopy;  Laterality: N/A;    SOCIAL HISTORY: Social History   Socioeconomic History   Marital status: Widowed    Spouse name: Not on file   Number of children: Not on file   Years of education: Not on file   Highest education level: Not on file  Occupational History   Not on file  Tobacco Use   Smoking status: Never   Smokeless tobacco: Never  Vaping Use   Vaping status: Never Used  Substance and Sexual Activity   Alcohol use: No   Drug use: No   Sexual activity: Not Currently  Other Topics Concern   Not on file  Social History Narrative   widow   Social Drivers of Health  Financial Resource Strain: Low Risk  (04/23/2024)   Received from [facility]   Overall Financial Resource Strain (CARDIA)    Difficulty of Paying Living Expenses: Not hard at all  Food Insecurity: No Food Insecurity (04/23/2024)   Received from [facility]   Hunger Vital Sign    Within the past 12 months, you worried that your food would run out before you got the money to buy more.: Never true    Within the past 12 months, the food you bought just didn't last and you didn't have money to get more.: Never true  Transportation Needs: No Transportation Needs (04/23/2024)   Received from [facility] - Transportation    In the past 12 months, has lack of transportation kept you from medical appointments or from getting medications?: No    Lack of Transportation (Non-Medical): No  Physical Activity: Insufficiently Active (04/01/2024)   Exercise Vital Sign    Days of Exercise per Week: 3 days    Minutes of Exercise per Session: 30 min  Stress: No Stress Concern Present  (04/01/2024)   [employer] of Occupational [employer] - Occupational Stress Questionnaire    Feeling of Stress: Not at all  Social Connections: Moderately Integrated (04/01/2024)   Social Connection and Isolation Panel    Frequency of Communication with Friends and Family: More than three times a week    Frequency of Social Gatherings with Friends and Family: More than three times a week    Attends Religious Services: More than 4 times per year    Active Member of [employer] or Organizations: Yes    Attends Banker Meetings: More than 4 times per year    Marital Status: Widowed  Intimate Partner Violence: Not At Risk (04/20/2024)   Humiliation, Afraid, Rape, and Kick questionnaire    Fear of Current or Ex-Partner: No    Emotionally Abused: No    Physically Abused: No    Sexually Abused: No    FAMILY HISTORY: Family History  Problem Relation Age of Onset   Cancer Father        Lung    Breast cancer Sister 68   Cancer Brother        details unknown.    ALLERGIES:  has no known allergies.  MEDICATIONS:  Current Outpatient Medications  Medication Sig Dispense Refill   amLODipine  (NORVASC ) 5 MG tablet Take 1 tablet (5 mg total) by mouth daily. 90 tablet 1   atorvastatin  (LIPITOR) 40 MG tablet Take 1 tablet by mouth once daily 90 tablet 3   blood glucose meter kit and supplies Dispense based on patient and insurance preference. Use to check blood sugars twice daily. (FOR ICD-10 E11.69). 1 each 0   cholecalciferol (VITAMIN D3) 25 MCG (1000 UNIT) tablet Take 1,000 Units by mouth daily.     dapagliflozin  propanediol (FARXIGA ) 10 MG TABS tablet Take 1 tablet (10 mg total) by mouth daily before breakfast. 90 tablet 3   Lancets (ONETOUCH DELICA PLUS LANCET33G) MISC USE TO TEST BLOOD SUGAR TWICE DAILY     levothyroxine  (SYNTHROID ) 100 MCG tablet TAKE 1 TABLET BY MOUTH ONCE DAILY BEFORE BREAKFAST 90 tablet 3   metoprolol  succinate (TOPROL -XL) 25 MG 24 hr tablet Take 1 tablet (25 mg  total) by mouth at bedtime. 90 tablet 1   ONETOUCH VERIO test strip USE 1 STRIP TO CHECK GLUCOSE THREE TIMES DAILY 200 each 0   Semaglutide , 2 MG/DOSE, 8 MG/3ML  SOPN Inject 2 mg as directed once a week. 3 mL 0   telmisartan  (MICARDIS ) 80 MG tablet Take 1 tablet (80 mg total) by mouth daily. 90 tablet 1   No current facility-administered medications for this visit.    Review of Systems  Constitutional:  Negative for appetite change, chills, fatigue and fever.  HENT:   Negative for hearing loss and voice change.   Eyes:  Negative for eye problems.  Respiratory:  Negative for chest tightness and cough.   Cardiovascular:  Negative for chest pain.  Gastrointestinal:  Negative for abdominal distention, abdominal pain and blood in stool.  Endocrine: Negative for hot flashes.  Genitourinary:  Negative for difficulty urinating and frequency.   Musculoskeletal:  Negative for arthralgias.  Skin:  Negative for itching and rash.  Neurological:  Negative for extremity weakness.  Hematological:  Negative for adenopathy.  Psychiatric/Behavioral:  Negative for confusion.      PHYSICAL EXAMINATION: ECOG PERFORMANCE STATUS: 0 - Asymptomatic  Vitals:   05/18/24 1032  BP: 125/66  Pulse: 71  Temp: (!) 97.4 F (36.3 C)  SpO2: 99%   Filed Weights   05/18/24 1032  Weight: 192 lb (87.1 kg)    Physical Exam Constitutional:      General: She is not in acute distress.    Appearance: She is not diaphoretic.  HENT:     Head: Normocephalic and atraumatic.  Eyes:     General: No scleral icterus. Cardiovascular:     Rate and Rhythm: Normal rate.  Pulmonary:     Effort: Pulmonary effort is normal. No respiratory distress.  Abdominal:     General: There is no distension.     Palpations: Abdomen is soft.     Tenderness: There is no abdominal tenderness.  Musculoskeletal:        General: Normal range of motion.     Cervical back: Normal range of motion and neck supple.  Skin:    Findings: No  erythema.  Neurological:     Mental Status: She is alert and oriented to person, place, and time. Mental status is at baseline.     Motor: No abnormal muscle tone.  Psychiatric:        Mood and Affect: Mood and affect normal.    LABORATORY DATA:  I have reviewed the data as listed    Latest Ref Rng & Units 02/19/2019    8:34 AM 09/09/2012    1:29 PM 10/02/2011    9:52 PM  CBC  WBC 4.0 - 10.5 K/uL 9.4  8.1  9.7   Hemoglobin 12.0 - 15.0 g/dL 87.2  85.9  87.4   Hematocrit 36.0 - 46.0 % 38.6  41.2  37.4   Platelets 150.0 - 400.0 K/uL 262.0  266.0  388       Latest Ref Rng & Units 02/13/2024   10:51 AM 11/12/2023   10:40 AM 05/14/2023   10:40 AM  CMP  Glucose 70 - 99 mg/dL 885  884  888   BUN 6 - 23 mg/dL 21  27  22    Creatinine 0.40 - 1.20 mg/dL 8.76  8.73  8.84   Sodium 135 - 145 mEq/L 139  138  137   Potassium 3.5 - 5.1 mEq/L 4.7  4.1  4.7   Chloride 96 - 112 mEq/L 104  103  102   CO2 19 - 32 mEq/L 26  22  26    Calcium  8.4 - 10.5 mg/dL 9.7  9.8  10.0  Total Protein 6.0 - 8.3 g/dL 7.8  8.3  7.9   Total Bilirubin 0.2 - 1.2 mg/dL 0.6  0.6  0.6   Alkaline Phos 39 - 117 U/L 41  48  46   AST 0 - 37 U/L 27  29  24    ALT 0 - 35 U/L 22  25  20       RADIOGRAPHIC STUDIES: I have personally reviewed the radiological images as listed and agreed with the findings in the report. MM Breast Surgical Specimen Result Date: 05/01/2024 CLINICAL DATA:  Status post [facility] localized LEFT breast lumpectomy. Patient underwent stereotactic biopsy calcifications demonstrated ductal carcinoma in situ (COIL clip). EXAM: SPECIMEN RADIOGRAPH OF THE LEFT BREAST COMPARISON:  Previous exam(s). FINDINGS: Status post excision of the left breast. [facility] reflector and COIL shaped clip are present within the specimen. IMPRESSION: Specimen radiograph of the left breast. Electronically Signed   By: Corean Salter M.D.   On: 05/01/2024 10:54   MM LT BREAST SAVI/RF TAG 1ST LESION MAMMO GUIDE Result  Date: 04/28/2024 CLINICAL DATA:  Pre lumpectomy localization for LEFT breast DCIS (coil clip) EXAM: NEEDLE LOCALIZATION OF THE LEFT BREAST WITH MAMMO GUIDANCE COMPARISON:  Previous exam(s). FINDINGS: Patient presents for needle localization prior to LEFT breast lumpectomy for DCIS. I met with the patient and we discussed the procedure of needle localization including benefits and alternatives. We discussed the high likelihood of a successful procedure. We discussed the risks of the procedure, including infection, bleeding, tissue injury, and further surgery. Informed, written consent was given. The usual time-out protocol was performed immediately prior to the procedure. Using mammographic guidance, sterile technique, 1% lidocaine  and a 10 cm SAVI SCOUT needle, the coil clip corresponding to biopsy-proven DCIS was localized using a LATERAL approach. The images were marked for Dr. Rodolph. IMPRESSION: Radar reflector localization of the LEFT breast. No apparent complications. Electronically Signed   By: Norleen Croak M.D.   On: 04/28/2024 13:28

## 2024-05-19 ENCOUNTER — Other Ambulatory Visit (HOSPITAL_COMMUNITY): Payer: Self-pay

## 2024-05-27 ENCOUNTER — Ambulatory Visit: Attending: Oncology | Admitting: Occupational Therapy

## 2024-05-27 ENCOUNTER — Inpatient Hospital Stay: Admitting: Hospice and Palliative Medicine

## 2024-05-27 DIAGNOSIS — Z510 Encounter for antineoplastic radiation therapy: Secondary | ICD-10-CM | POA: Diagnosis present

## 2024-05-27 DIAGNOSIS — N63 Unspecified lump in unspecified breast: Secondary | ICD-10-CM | POA: Insufficient documentation

## 2024-05-27 DIAGNOSIS — D0512 Intraductal carcinoma in situ of left breast: Secondary | ICD-10-CM

## 2024-05-27 NOTE — Progress Notes (Signed)
 Multidisciplinary Oncology Council Documentation  Avigail W Latin was presented by our [facility] on 05/27/2024, which included representatives from:  Palliative Care Dietitian  Physical/Occupational Therapist Nurse Navigator Genetics Social work Survivorship RN Financial Navigator Research RN   Twilia currently presents with history of DCIS  We reviewed previous medical and familial history, history of present illness, and recent lab results along with all available histopathologic and imaging studies. The MOC considered available treatment options and made the following recommendations/referrals:  Rehab screening  The MOC is a meeting of clinicians from various specialty areas who evaluate and discuss patients for whom a multidisciplinary approach is being considered. Final determinations in the plan of care are those of the provider(s).   Today's extended care, comprehensive team conference, [place] was not present for the discussion and was not examined.

## 2024-05-27 NOTE — Therapy (Signed)
 OUTPATIENT OCCUPATIONAL THERAPY BREAST CANCER POSTOP EVALUATION   Patient Name: Marissa Rodriguez MRN: [id number] DOB:01/17/1941, 83 y.o., Marissa Today's Date: 05/27/2024  END OF SESSION:  OT End of Session - 05/27/24 1319     Visit Number 1    Number of Visits 2    Date for Recertification  08/19/24    OT Start Time 1040    OT Stop Time 1108    OT Time Calculation (min) 28 min    Activity Tolerance Patient tolerated treatment well    Behavior During Therapy [facility] for tasks assessed/performed          Past Medical History:  Diagnosis Date   Closed fracture of part of upper end of humerus 09/24/2015   Diabetes mellitus without complication (HCC)    Ductal carcinoma in situ of breast 04/16/2024   History of MRI of brain and brain stem 10/2010   normal MRI/MRA done to rule out CVA   Hyperkalemia 01/16/2021   Hyperlipidemia    Hypertension    Hypothyroidism    Liver cirrhosis secondary to NASH (nonalcoholic steatohepatitis) (HCC) 11/09/2022   Polyp of transverse colon    Secondary esophageal varices without bleeding (HCC) 10/14/2011   Tubular adenoma of colon 04/2009   repeat due 2015   Tubular adenoma of colon    Type 2 diabetes mellitus with stage 3 chronic kidney disease (HCC) 12/10/2012   Past Surgical History:  Procedure Laterality Date   ABDOMINAL HYSTERECTOMY  06/25/1973   TAH/LSO, wedge resection of right   APPENDECTOMY  06/25/1973   incidental, done during TAH   BREAST BIOPSY Left 04/14/2024   stereo calcs coil path pending   BREAST BIOPSY Left 04/14/2024   MM LT BREAST BX W LOC DEV 1ST LESION IMAGE BX SPEC STEREO GUIDE 04/14/2024 [facility]   BREAST BIOPSY  04/28/2024   MM LT BREAST SAVI/RF TAG 1ST LESION MAMMO GUIDE 04/28/2024 [facility]   BREAST LUMPECTOMY WITH RADIO FREQUENCY LOCALIZER Left 05/01/2024   Procedure: BREAST LUMPECTOMY WITH RADIO FREQUENCY LOCALIZER;  Surgeon: Rodolph Romano, MD;  Location: [facility];  Service: General;   Laterality: Left;   COLONOSCOPY WITH PROPOFOL  N/A 03/19/2019   Procedure: COLONOSCOPY WITH BIOPSIES;  Surgeon: Jinny Carmine, MD;  Location: [facility];  Service: Endoscopy;  Laterality: N/A;  Diabetic   POLYPECTOMY N/A 03/19/2019   Procedure: POLYPECTOMY;  Surgeon: Jinny Carmine, MD;  Location: [facility];  Service: Endoscopy;  Laterality: N/A;   Patient Active Problem List   Diagnosis Date Noted   Family history of breast cancer 04/20/2024   Ductal carcinoma in situ of breast 04/16/2024   Diabetes mellitus treated with injections of non-insulin medication (HCC) 02/15/2024   Long-term current use of injectable noninsulin antidiabetic medication 11/09/2022   Systolic murmur 11/06/2021   History of colonic polyps    Polyp of transverse colon    Vitamin D  deficiency 09/24/2015   S/P TAH-BSO (total abdominal hysterectomy and bilateral salpingo-oophorectomy) 03/17/2014   Liver cirrhosis secondary to NASH (nonalcoholic steatohepatitis) (HCC) 12/10/2012   Type 2 diabetes mellitus with stage 3 chronic kidney disease (HCC) 12/10/2012   Encounter for preventive health examination 09/09/2012   History of pneumonia 10/14/2011   Obesity (BMI 30-39.9) 10/14/2011   Secondary esophageal varices without bleeding (HCC) 10/14/2011   Tubular adenoma of colon    Essential hypertension 09/24/2011   Hypothyroidism 09/24/2011   Hyperlipidemia     PCP: Dr Marylynn  REFERRING PROVIDER: Dr Babara  REFERRING DIAG: L lumpectomy  THERAPY DIAG:  Swelling of breast  Rationale for Evaluation and Treatment: Rehabilitation  ONSET DATE: 05/01/24  SUBJECTIVE:                                                                                                                                                                                           SUBJECTIVE STATEMENT: I am doing good after my surgery  my L breast is still swollen and tender here on the side - they doing my mold for my radiation  tomorrow  PERTINENT HISTORY:  05/14/24 Dr Cesar note: BREAST: Left breast incision is healing well. There is a small seroma without infectious changes.  Assessment & Plan Left breast ductal carcinoma in situ, post-partial mastectomy  Post-partial mastectomy for left breast ductal carcinoma in situ revealed intermediate grade ductal carcinoma in situ with negative margins. No further surgical intervention is needed. Continue follow-up with oncology and radiation oncology for further management.  Postoperative swelling and pain of left breast  Postoperative swelling and mild pain in the left breast are normal following partial mastectomy. Mild itching, burning, and stabbing pains are also expected. Swelling should decrease over time, though temporary increases in inflammation or fluid may occur. A follow-up appointment is scheduled in four weeks to assess swelling and healing. She should contact the office if radiation therapy is scheduled before the follow-up to adjust timing as needed.  Ductal carcinoma in situ (DCIS) of left breast [D05.12]   PATIENT GOALS:   reduce lymphedema risk and learn post op HEP.   PAIN:  Are you having pain? Tenderness lateral L breast 2/10  PRECAUTIONS: Active CA , Low risk for lymphedema L UE and thoracic    HAND DOMINANCE: right  WEIGHT BEARING RESTRICTIONS: No  FALLS:  Has patient fallen in last 6 months? No  LIVING ENVIRONMENT: Patient lives with: Alone but family close by   OCCUPATION: Retired   LEISURE: likes to the [employer] , spend time with family , do her own house cleaning   OBJECTIVE:  COGNITION: Overall cognitive status: Within functional limits for tasks assessed    POSTURE:  rounded shoulders posture  UPPER EXTREMITY AROM/PROM: BIlateral shoulder AROM WNL - -pt has hx of shoulder fx on the R shoulder - but AROM WNL - do some ROM daily to maintain her motion  Able to get into radiation position      CERVICAL AROM: All within  normal limits:     UPPER EXTREMITY STRENGTH: 5-/5 for bilateral shoulders flexion, ABD and ext roation   LYMPHEDEMA ASSESSMENTS:    LYMPHEDEMA/ONCOLOGY QUESTIONNAIRE - 05/27/24 0001       Right Upper Extremity Lymphedema   15  cm Proximal to Olecranon Process 40 cm    10 cm Proximal to Olecranon Process 37 cm    Olecranon Process 32.5 cm    15 cm Proximal to Ulnar Styloid Process 30.8 cm      Left Upper Extremity Lymphedema   15 cm Proximal to Olecranon Process 41 cm    10 cm Proximal to Olecranon Process 38 cm    Olecranon Process 32.8 cm    15 cm Proximal to Ulnar Styloid Process 31.5 cm         Pt report using both arms evenly -but carry most of the time objects in the L UE  Increase postop swelling in L breast - pt ed on modify MLD from L to R AAA - 20 reps  3 x day   L-DEX LYMPHEDEMA SCREENING:  The patient was assessed using the L-Dex machine today to produce a lymphedema index baseline score. The patient will be reassessed on a regular basis (typically every 3 months) to obtain new L-Dex scores. If the score is > 6.5 points away from his/her baseline score indicating onset of subclinical lymphedema, it will be recommended to wear a compression garment for 4 weeks, 12 hours per day and then be reassessed. If the score continues to be > 6.5 points from baseline at reassessment, we will initiate lymphedema treatment. Assessing in this manner has a 95% rate of preventing clinically significant lymphedema.  Pt had no ln removed    PATIENT EDUCATION:  Education details: Lymphedema risk reduction and post op shoulder/posture HEP Person educated: Patient Education method: Explanation, Demonstration, Handout Education comprehension: Patient verbalized understanding and returned demonstration   ASSESSMENT:  CLINICAL IMPRESSION:  Pt present about 3 wks s/p L lumpectomy by Dr CesarGLENWOOD Coe - pt hd no ln removed- pt AROM in L shoulder WNL - has hx of shoulder fx in past - but  doing well - pt do have increase swelling in there L breast - Radiation mold bening done tomorrow - radiation plan to start 11th Dec- breast Natvigator called to move pt's appt up to see surgeon tomorrow for cont postop L breast swelling and tenderness- pt ed on doing some modify MLD to L breast over L to R AAA- 20 reps - 3 x day - AROM WNL and able to get into radiation position - pt can follow up with me again after radiation if cont to have issues with increase symptoms in L breast - She will benefit OT reassessment to determine needs if cont to have symptoms -Pt will benefit from skilled therapeutic intervention to improve on the following deficits: Decreased knowledge of precautions and lymphedema education, impaired UE functional use, pain, decreased ROM, postural dysfunction.   OT treatment/interventions: ADL/self-care home management, pt/family education, therapeutic exercise,manual therapy  REHAB POTENTIAL: Good  CLINICAL DECISION MAKING: Stable/uncomplicated  EVALUATION COMPLEXITY: Low   GOALS: Goals reviewed with patient? YES  LONG TERM GOALS: (STG=LTG)    Name Target Date Goal status  1 Pt will be able to verbalize understanding of pertinent lymphedema risk reduction practices relevant to her dx specifically related to skin care.  Baseline:  No knowledge 12 wks NEW  2 Pt will be able to return demo and/or verbalize understanding of the post op HEP related to regaining shoulder ROM. Baseline:  No knowledge today MET       4 Pt will demo she has regained full shoulder ROM and function post operatively compared to baselines.  Baseline: See objective measurements taken today.  Today  MET    PLAN:  OT FREQUENCY/DURATION: EVAL and 1 follow up appointment if needed after radiation  PLAN FOR NEXT SESSION:  Occupational Therapy Information for After Breast Cancer Surgery/Treatment:  Lymphedema is a swelling condition that you may be at risk for in your arm if you have lymph nodes  removed from the armpit area.  After a sentinel node biopsy, the risk is approximately 5-9% and is higher after an axillary node dissection.  There is treatment available for this condition and it is not life-threatening.  Contact your physician or occupational therapist with concerns. You may begin the 4 shoulder/posture exercises (see additional sheet) when permitted by your physician (typically a week after surgery).  If you have drains, you may need to wait until those are removed before beginning range of motion exercises.  A general recommendation is to not lift your arms above shoulder height until drains are removed.  These exercises should be done to your tolerance and gently.  This is not a no pain/no gain type of recovery so listen to your body and stretch into the range of motion that you can tolerate, stopping if you have pain.  If you are having immediate reconstruction, ask your plastic surgeon about doing exercises as he or she may want you to wait. .  While undergoing any medical procedure or treatment, try to avoid blood pressure being taken or needle sticks from occurring on the arm on the side of cancer.   This recommendation begins after surgery and continues for the rest of your life.  This may help reduce your risk of getting lymphedema (swelling in your arm). An excellent resource for those seeking information on lymphedema is the National Lymphedema Network's web site. It can be accessed at www.lymphnet.org If you notice swelling in your hand, arm or breast at any time following surgery (even if it is many years from now), please contact your doctor or occupational therapist to discuss this.  Lymphedema can be treated at any time but it is easier for you if it is treated early on.  If you feel like your shoulder motion is not returning to normal in a reasonable amount of time, please contact your surgeon or occupational therapist.  [facility] [phone]. [address], [place], [place] [address]       Ancel Peters, OTR/L,CLT 05/27/2024, 1:46 PM

## 2024-05-28 ENCOUNTER — Ambulatory Visit
Admission: RE | Admit: 2024-05-28 | Discharge: 2024-05-28 | Disposition: A | Discharge status: Home / Self Care | Source: Ambulatory Visit | Attending: Radiation Oncology | Admitting: Radiation Oncology

## 2024-05-28 ENCOUNTER — Encounter: Payer: Self-pay | Admitting: *Deleted

## 2024-05-28 DIAGNOSIS — Z510 Encounter for antineoplastic radiation therapy: Secondary | ICD-10-CM | POA: Insufficient documentation

## 2024-05-28 DIAGNOSIS — D0512 Intraductal carcinoma in situ of left breast: Secondary | ICD-10-CM | POA: Insufficient documentation

## 2024-05-28 NOTE — Progress Notes (Signed)
"  ° °  History of Present Illness Marissa Rodriguez is an 83 year old female who presents for a wound follow-up after a partial mastectomy of the left breast.  She underwent a partial mastectomy of the left breast on May 01, 2034. There is a small amount of fluid present.  She is scheduled to start adjuvant radiation therapy soon. She mentioned that her phone 'lit up like crazy' with notifications regarding her treatment schedule.   PAST SURGICAL HISTORY:   Past Surgical History:  Procedure Laterality Date   COLONOSCOPY  05/10/2009   02/24/2004   UPPER GASTROINTESTINAL ENDOSCOPY  09/12/2009   MASTECTOMY PARTIAL Left 05/01/2024   Dr Marissa Rodriguez  -- w/ RF   ABDOMINAL HYSTERECTOMY W/ PARTIAL VAGINACTOMY     with one-fourth of an ovary left -- age 30         PHYSICAL EXAM:  Vitals:   05/28/24 0818  BP: (!) 173/76  Pulse: 72  .  Ht:165.1 cm (5' 5) Wt:83.9 kg (185 lb) ADJ:Anib surface area is 1.96 meters squared. Body mass index is 30.79 kg/m.Marissa Rodriguez   GENERAL: Alert, active, oriented x3  BREAST: Left breast scar well-healed.  There is a small to moderate size seroma on the surgical area, asymptomatic, no sign of infection   Assessment & Plan Status post partial mastectomy of left breast for ductal carcinoma in situ with postoperative seroma   Postoperative seroma is present in the left breast following the partial mastectomy on November 7th, 2035. Healing is progressing well with expected fluid accumulation and no significant complications.  Patient is set to start adjuvant radiation therapy next week. Proceed with adjuvant radiation therapy as scheduled. Monitor the seroma and consider aspiration if fluid accumulation becomes excessive. A follow-up appointment is scheduled in six weeks to assess progress post-radiation therapy.  Ductal carcinoma in situ (DCIS) of left breast [D05.12]         Patient verbalized understanding, all questions were answered, and were agreeable with  the plan outlined above.   Marissa Sjogren, MD "

## 2024-05-29 ENCOUNTER — Other Ambulatory Visit: Payer: Self-pay | Admitting: *Deleted

## 2024-05-29 DIAGNOSIS — D0511 Intraductal carcinoma in situ of right breast: Secondary | ICD-10-CM

## 2024-06-01 ENCOUNTER — Inpatient Hospital Stay

## 2024-06-01 NOTE — Progress Notes (Signed)
 Tumor Board Documentation  Marissa Rodriguez was presented by Dr. Babara at our Tumor Board on 06/01/2024, which included representatives from medical oncology, radiation oncology, radiology, pathology, navigation, research.  Marissa Rodriguez currently presents as a current patient with history of the following treatments: surgical intervention(s).  Additionally, we reviewed previous medical and familial history, history of present illness, and recent lab results along with all available histopathologic and imaging studies. The tumor board considered available treatment options and made the following recommendations: Adjuvant radiation, Hormonal therapy    The following procedures/referrals were also placed: No orders of the defined types were placed in this encounter.   Clinical Trial Status: not discussed   Staging used: AJCC Staging: T: Tis          National site-specific guidelines   were discussed with respect to the case.  Tumor board is a meeting of clinicians from various specialty areas who evaluate and discuss patients for whom a multidisciplinary approach is being considered. Final determinations in the plan of care are those of the provider(s). The responsibility for follow up of recommendations given during tumor board is that of the provider.   Today's extended care, comprehensive team conference, Marissa Rodriguez was not present for the discussion and was not examined.   Multidisciplinary Tumor Board is a multidisciplinary case peer review process.  Decisions discussed in the Multidisciplinary Tumor Board reflect the opinions of the specialists present at the conference without having examined the patient.  Ultimately, treatment and diagnostic decisions rest with the primary provider(s) and the patient.

## 2024-06-03 DIAGNOSIS — Z510 Encounter for antineoplastic radiation therapy: Secondary | ICD-10-CM | POA: Diagnosis not present

## 2024-06-04 ENCOUNTER — Ambulatory Visit
Admission: RE | Admit: 2024-06-04 | Discharge: 2024-06-04 | Attending: Radiation Oncology | Admitting: Radiation Oncology

## 2024-06-04 DIAGNOSIS — Z510 Encounter for antineoplastic radiation therapy: Secondary | ICD-10-CM | POA: Diagnosis not present

## 2024-06-08 ENCOUNTER — Ambulatory Visit
Admission: RE | Admit: 2024-06-08 | Discharge: 2024-06-08 | Disposition: A | Discharge status: Home / Self Care | Source: Ambulatory Visit | Attending: Radiation Oncology | Admitting: Radiation Oncology

## 2024-06-08 ENCOUNTER — Other Ambulatory Visit: Payer: Self-pay

## 2024-06-08 DIAGNOSIS — Z510 Encounter for antineoplastic radiation therapy: Secondary | ICD-10-CM | POA: Diagnosis not present

## 2024-06-08 LAB — RAD ONC ARIA SESSION SUMMARY
Course Elapsed Days: 0
Plan Fractions Treated to Date: 1
Plan Prescribed Dose Per Fraction: 2.66 Gy
Plan Total Fractions Prescribed: 16
Plan Total Prescribed Dose: 42.56 Gy
Reference Point Dosage Given to Date: 2.66 Gy
Reference Point Session Dosage Given: 2.66 Gy
Session Number: 1

## 2024-06-09 ENCOUNTER — Other Ambulatory Visit: Payer: Self-pay

## 2024-06-09 ENCOUNTER — Ambulatory Visit
Admission: RE | Admit: 2024-06-09 | Discharge: 2024-06-09 | Disposition: A | Discharge status: Home / Self Care | Source: Ambulatory Visit | Attending: Radiation Oncology | Admitting: Radiation Oncology

## 2024-06-09 DIAGNOSIS — Z510 Encounter for antineoplastic radiation therapy: Secondary | ICD-10-CM | POA: Diagnosis not present

## 2024-06-09 LAB — RAD ONC ARIA SESSION SUMMARY
Course Elapsed Days: 1
Plan Fractions Treated to Date: 2
Plan Prescribed Dose Per Fraction: 2.66 Gy
Plan Total Fractions Prescribed: 16
Plan Total Prescribed Dose: 42.56 Gy
Reference Point Dosage Given to Date: 5.32 Gy
Reference Point Session Dosage Given: 2.66 Gy
Session Number: 2

## 2024-06-10 ENCOUNTER — Other Ambulatory Visit: Payer: Self-pay | Admitting: Internal Medicine

## 2024-06-10 ENCOUNTER — Ambulatory Visit
Admission: RE | Admit: 2024-06-10 | Discharge: 2024-06-10 | Attending: Radiation Oncology | Admitting: Radiation Oncology

## 2024-06-10 ENCOUNTER — Other Ambulatory Visit: Payer: Self-pay

## 2024-06-10 DIAGNOSIS — Z510 Encounter for antineoplastic radiation therapy: Secondary | ICD-10-CM | POA: Diagnosis not present

## 2024-06-10 LAB — RAD ONC ARIA SESSION SUMMARY
Course Elapsed Days: 2
Plan Fractions Treated to Date: 3
Plan Prescribed Dose Per Fraction: 2.66 Gy
Plan Total Fractions Prescribed: 16
Plan Total Prescribed Dose: 42.56 Gy
Reference Point Dosage Given to Date: 7.98 Gy
Reference Point Session Dosage Given: 2.66 Gy
Session Number: 3

## 2024-06-11 ENCOUNTER — Other Ambulatory Visit: Payer: Self-pay

## 2024-06-11 ENCOUNTER — Ambulatory Visit
Admission: RE | Admit: 2024-06-11 | Discharge: 2024-06-11 | Attending: Radiation Oncology | Admitting: Radiation Oncology

## 2024-06-11 DIAGNOSIS — Z510 Encounter for antineoplastic radiation therapy: Secondary | ICD-10-CM | POA: Diagnosis not present

## 2024-06-11 LAB — RAD ONC ARIA SESSION SUMMARY
Course Elapsed Days: 3
Plan Fractions Treated to Date: 4
Plan Prescribed Dose Per Fraction: 2.66 Gy
Plan Total Fractions Prescribed: 16
Plan Total Prescribed Dose: 42.56 Gy
Reference Point Dosage Given to Date: 10.64 Gy
Reference Point Session Dosage Given: 2.66 Gy
Session Number: 4

## 2024-06-12 ENCOUNTER — Other Ambulatory Visit: Payer: Self-pay

## 2024-06-12 ENCOUNTER — Inpatient Hospital Stay

## 2024-06-12 ENCOUNTER — Ambulatory Visit
Admission: RE | Admit: 2024-06-12 | Discharge: 2024-06-12 | Disposition: A | Discharge status: Home / Self Care | Source: Ambulatory Visit | Attending: Radiation Oncology | Admitting: Radiation Oncology

## 2024-06-12 DIAGNOSIS — Z510 Encounter for antineoplastic radiation therapy: Secondary | ICD-10-CM | POA: Diagnosis not present

## 2024-06-12 LAB — RAD ONC ARIA SESSION SUMMARY
Course Elapsed Days: 4
Plan Fractions Treated to Date: 5
Plan Prescribed Dose Per Fraction: 2.66 Gy
Plan Total Fractions Prescribed: 16
Plan Total Prescribed Dose: 42.56 Gy
Reference Point Dosage Given to Date: 13.3 Gy
Reference Point Session Dosage Given: 2.66 Gy
Session Number: 5

## 2024-06-15 ENCOUNTER — Other Ambulatory Visit: Payer: Self-pay

## 2024-06-15 ENCOUNTER — Ambulatory Visit
Admission: RE | Admit: 2024-06-15 | Discharge: 2024-06-15 | Disposition: A | Discharge status: Home / Self Care | Source: Ambulatory Visit | Attending: Radiation Oncology | Admitting: Radiation Oncology

## 2024-06-15 DIAGNOSIS — Z510 Encounter for antineoplastic radiation therapy: Secondary | ICD-10-CM | POA: Diagnosis not present

## 2024-06-15 LAB — RAD ONC ARIA SESSION SUMMARY
Course Elapsed Days: 7
Plan Fractions Treated to Date: 6
Plan Prescribed Dose Per Fraction: 2.66 Gy
Plan Total Fractions Prescribed: 16
Plan Total Prescribed Dose: 42.56 Gy
Reference Point Dosage Given to Date: 15.96 Gy
Reference Point Session Dosage Given: 2.66 Gy
Session Number: 6

## 2024-06-16 ENCOUNTER — Other Ambulatory Visit: Payer: Self-pay

## 2024-06-16 ENCOUNTER — Ambulatory Visit
Admission: RE | Admit: 2024-06-16 | Discharge: 2024-06-16 | Disposition: A | Discharge status: Home / Self Care | Source: Ambulatory Visit | Attending: Radiation Oncology | Admitting: Radiation Oncology

## 2024-06-16 DIAGNOSIS — Z510 Encounter for antineoplastic radiation therapy: Secondary | ICD-10-CM | POA: Diagnosis not present

## 2024-06-16 LAB — RAD ONC ARIA SESSION SUMMARY
Course Elapsed Days: 8
Plan Fractions Treated to Date: 7
Plan Prescribed Dose Per Fraction: 2.66 Gy
Plan Total Fractions Prescribed: 16
Plan Total Prescribed Dose: 42.56 Gy
Reference Point Dosage Given to Date: 18.62 Gy
Reference Point Session Dosage Given: 2.66 Gy
Session Number: 7

## 2024-06-17 ENCOUNTER — Other Ambulatory Visit: Payer: Self-pay

## 2024-06-17 DIAGNOSIS — Z510 Encounter for antineoplastic radiation therapy: Secondary | ICD-10-CM | POA: Diagnosis not present

## 2024-06-17 LAB — RAD ONC ARIA SESSION SUMMARY
Course Elapsed Days: 9
Plan Fractions Treated to Date: 8
Plan Prescribed Dose Per Fraction: 2.66 Gy
Plan Total Fractions Prescribed: 16
Plan Total Prescribed Dose: 42.56 Gy
Reference Point Dosage Given to Date: 21.28 Gy
Reference Point Session Dosage Given: 2.66 Gy
Session Number: 8

## 2024-06-22 ENCOUNTER — Ambulatory Visit
Admission: RE | Admit: 2024-06-22 | Discharge: 2024-06-22 | Disposition: A | Discharge status: Home / Self Care | Source: Ambulatory Visit | Attending: Radiation Oncology | Admitting: Radiation Oncology

## 2024-06-22 ENCOUNTER — Other Ambulatory Visit: Payer: Self-pay

## 2024-06-22 DIAGNOSIS — Z510 Encounter for antineoplastic radiation therapy: Secondary | ICD-10-CM | POA: Diagnosis not present

## 2024-06-22 LAB — RAD ONC ARIA SESSION SUMMARY
Course Elapsed Days: 14
Plan Fractions Treated to Date: 9
Plan Prescribed Dose Per Fraction: 2.66 Gy
Plan Total Fractions Prescribed: 16
Plan Total Prescribed Dose: 42.56 Gy
Reference Point Dosage Given to Date: 23.94 Gy
Reference Point Session Dosage Given: 2.66 Gy
Session Number: 9

## 2024-06-22 NOTE — Telephone Encounter (Signed)
 PAP: Patient assistance application for Farxiga  has been approved by PAP Companies: [employer] from 06/25/2024 to 06/24/2025. Medication should be delivered to PAP Delivery: Home. For further shipping updates, please contact [employer] ([employer]) at [phone]. Patient ID is: not provided.

## 2024-06-23 ENCOUNTER — Ambulatory Visit
Admission: RE | Admit: 2024-06-23 | Discharge: 2024-06-23 | Disposition: A | Discharge status: Home / Self Care | Source: Ambulatory Visit | Attending: Radiation Oncology | Admitting: Radiation Oncology

## 2024-06-23 ENCOUNTER — Ambulatory Visit
Admission: RE | Admit: 2024-06-23 | Discharge: 2024-06-23 | Disposition: A | Discharge status: Home / Self Care | Source: Ambulatory Visit | Attending: Oncology | Admitting: Oncology

## 2024-06-23 ENCOUNTER — Other Ambulatory Visit: Payer: Self-pay

## 2024-06-23 DIAGNOSIS — D0512 Intraductal carcinoma in situ of left breast: Secondary | ICD-10-CM | POA: Insufficient documentation

## 2024-06-23 DIAGNOSIS — Z510 Encounter for antineoplastic radiation therapy: Secondary | ICD-10-CM | POA: Diagnosis not present

## 2024-06-23 DIAGNOSIS — M8589 Other specified disorders of bone density and structure, multiple sites: Secondary | ICD-10-CM | POA: Diagnosis not present

## 2024-06-23 LAB — RAD ONC ARIA SESSION SUMMARY
Course Elapsed Days: 15
Plan Fractions Treated to Date: 10
Plan Prescribed Dose Per Fraction: 2.66 Gy
Plan Total Fractions Prescribed: 16
Plan Total Prescribed Dose: 42.56 Gy
Reference Point Dosage Given to Date: 26.6 Gy
Reference Point Session Dosage Given: 2.66 Gy
Session Number: 10

## 2024-06-24 ENCOUNTER — Other Ambulatory Visit: Payer: Self-pay

## 2024-06-24 ENCOUNTER — Ambulatory Visit
Admission: RE | Admit: 2024-06-24 | Discharge: 2024-06-24 | Disposition: A | Discharge status: Home / Self Care | Source: Ambulatory Visit | Attending: Radiation Oncology | Admitting: Radiation Oncology

## 2024-06-24 DIAGNOSIS — Z510 Encounter for antineoplastic radiation therapy: Secondary | ICD-10-CM | POA: Diagnosis not present

## 2024-06-24 LAB — RAD ONC ARIA SESSION SUMMARY
Course Elapsed Days: 16
Plan Fractions Treated to Date: 11
Plan Prescribed Dose Per Fraction: 2.66 Gy
Plan Total Fractions Prescribed: 16
Plan Total Prescribed Dose: 42.56 Gy
Reference Point Dosage Given to Date: 29.26 Gy
Reference Point Session Dosage Given: 2.66 Gy
Session Number: 11

## 2024-06-26 ENCOUNTER — Inpatient Hospital Stay: Payer: Self-pay | Attending: Oncology

## 2024-06-26 ENCOUNTER — Ambulatory Visit
Admission: RE | Admit: 2024-06-26 | Discharge: 2024-06-26 | Disposition: A | Discharge status: Home / Self Care | Payer: Self-pay | Source: Ambulatory Visit | Attending: Radiation Oncology | Admitting: Radiation Oncology

## 2024-06-26 ENCOUNTER — Other Ambulatory Visit: Payer: Self-pay

## 2024-06-26 DIAGNOSIS — N63 Unspecified lump in unspecified breast: Secondary | ICD-10-CM | POA: Insufficient documentation

## 2024-06-26 DIAGNOSIS — D0512 Intraductal carcinoma in situ of left breast: Secondary | ICD-10-CM | POA: Diagnosis present

## 2024-06-26 DIAGNOSIS — Z510 Encounter for antineoplastic radiation therapy: Secondary | ICD-10-CM | POA: Diagnosis present

## 2024-06-26 LAB — RAD ONC ARIA SESSION SUMMARY
Course Elapsed Days: 18
Plan Fractions Treated to Date: 12
Plan Prescribed Dose Per Fraction: 2.66 Gy
Plan Total Fractions Prescribed: 16
Plan Total Prescribed Dose: 42.56 Gy
Reference Point Dosage Given to Date: 31.92 Gy
Reference Point Session Dosage Given: 2.66 Gy
Session Number: 12

## 2024-06-29 ENCOUNTER — Ambulatory Visit
Admission: RE | Admit: 2024-06-29 | Discharge: 2024-06-29 | Disposition: A | Discharge status: Home / Self Care | Payer: Self-pay | Source: Ambulatory Visit | Attending: Radiation Oncology | Admitting: Radiation Oncology

## 2024-06-29 ENCOUNTER — Other Ambulatory Visit: Payer: Self-pay

## 2024-06-29 ENCOUNTER — Inpatient Hospital Stay: Payer: Self-pay | Attending: Oncology

## 2024-06-29 DIAGNOSIS — D0511 Intraductal carcinoma in situ of right breast: Secondary | ICD-10-CM

## 2024-06-29 DIAGNOSIS — Z510 Encounter for antineoplastic radiation therapy: Secondary | ICD-10-CM | POA: Diagnosis not present

## 2024-06-29 LAB — CBC (CANCER CENTER ONLY)
HCT: 43.3 % (ref 36.0–46.0)
Hemoglobin: 14.2 g/dL (ref 12.0–15.0)
MCH: 28.8 pg (ref 26.0–34.0)
MCHC: 32.8 g/dL (ref 30.0–36.0)
MCV: 87.8 fL (ref 80.0–100.0)
Platelet Count: 219 K/uL (ref 150–400)
RBC: 4.93 MIL/uL (ref 3.87–5.11)
RDW: 13.2 % (ref 11.5–15.5)
WBC Count: 8.6 K/uL (ref 4.0–10.5)
nRBC: 0 % (ref 0.0–0.2)

## 2024-06-29 LAB — RAD ONC ARIA SESSION SUMMARY
Course Elapsed Days: 21
Plan Fractions Treated to Date: 13
Plan Prescribed Dose Per Fraction: 2.66 Gy
Plan Total Fractions Prescribed: 16
Plan Total Prescribed Dose: 42.56 Gy
Reference Point Dosage Given to Date: 34.58 Gy
Reference Point Session Dosage Given: 2.66 Gy
Session Number: 13

## 2024-06-30 ENCOUNTER — Ambulatory Visit
Admission: RE | Admit: 2024-06-30 | Discharge: 2024-06-30 | Disposition: A | Discharge status: Home / Self Care | Payer: Self-pay | Source: Ambulatory Visit | Attending: Radiation Oncology | Admitting: Radiation Oncology

## 2024-06-30 ENCOUNTER — Other Ambulatory Visit: Payer: Self-pay

## 2024-06-30 DIAGNOSIS — Z510 Encounter for antineoplastic radiation therapy: Secondary | ICD-10-CM | POA: Diagnosis not present

## 2024-06-30 LAB — RAD ONC ARIA SESSION SUMMARY
Course Elapsed Days: 22
Plan Fractions Treated to Date: 14
Plan Prescribed Dose Per Fraction: 2.66 Gy
Plan Total Fractions Prescribed: 16
Plan Total Prescribed Dose: 42.56 Gy
Reference Point Dosage Given to Date: 37.24 Gy
Reference Point Session Dosage Given: 2.66 Gy
Session Number: 14

## 2024-07-01 ENCOUNTER — Ambulatory Visit
Admission: RE | Admit: 2024-07-01 | Discharge: 2024-07-01 | Disposition: A | Discharge status: Home / Self Care | Payer: Self-pay | Source: Ambulatory Visit | Attending: Radiation Oncology | Admitting: Radiation Oncology

## 2024-07-01 ENCOUNTER — Other Ambulatory Visit: Payer: Self-pay

## 2024-07-01 DIAGNOSIS — Z510 Encounter for antineoplastic radiation therapy: Secondary | ICD-10-CM | POA: Diagnosis not present

## 2024-07-01 LAB — RAD ONC ARIA SESSION SUMMARY
Course Elapsed Days: 23
Plan Fractions Treated to Date: 15
Plan Prescribed Dose Per Fraction: 2.66 Gy
Plan Total Fractions Prescribed: 16
Plan Total Prescribed Dose: 42.56 Gy
Reference Point Dosage Given to Date: 39.9 Gy
Reference Point Session Dosage Given: 2.66 Gy
Session Number: 15

## 2024-07-02 ENCOUNTER — Ambulatory Visit
Admission: RE | Admit: 2024-07-02 | Discharge: 2024-07-02 | Disposition: A | Discharge status: Home / Self Care | Payer: Self-pay | Source: Ambulatory Visit | Attending: Radiation Oncology | Admitting: Radiation Oncology

## 2024-07-02 ENCOUNTER — Other Ambulatory Visit: Payer: Self-pay

## 2024-07-02 DIAGNOSIS — Z510 Encounter for antineoplastic radiation therapy: Secondary | ICD-10-CM | POA: Diagnosis not present

## 2024-07-02 LAB — RAD ONC ARIA SESSION SUMMARY
Course Elapsed Days: 24
Plan Fractions Treated to Date: 16
Plan Prescribed Dose Per Fraction: 2.66 Gy
Plan Total Fractions Prescribed: 16
Plan Total Prescribed Dose: 42.56 Gy
Reference Point Dosage Given to Date: 42.56 Gy
Reference Point Session Dosage Given: 2.66 Gy
Session Number: 16

## 2024-07-03 ENCOUNTER — Ambulatory Visit
Admission: RE | Admit: 2024-07-03 | Discharge: 2024-07-03 | Disposition: A | Discharge status: Home / Self Care | Payer: Self-pay | Source: Ambulatory Visit | Attending: Radiation Oncology | Admitting: Radiation Oncology

## 2024-07-03 ENCOUNTER — Other Ambulatory Visit: Payer: Self-pay

## 2024-07-03 DIAGNOSIS — Z510 Encounter for antineoplastic radiation therapy: Secondary | ICD-10-CM | POA: Diagnosis not present

## 2024-07-03 LAB — RAD ONC ARIA SESSION SUMMARY
Course Elapsed Days: 25
Plan Fractions Treated to Date: 1
Plan Prescribed Dose Per Fraction: 2 Gy
Plan Total Fractions Prescribed: 5
Plan Total Prescribed Dose: 10 Gy
Reference Point Dosage Given to Date: 2 Gy
Reference Point Session Dosage Given: 2 Gy
Session Number: 17

## 2024-07-06 ENCOUNTER — Other Ambulatory Visit: Payer: Self-pay

## 2024-07-06 ENCOUNTER — Ambulatory Visit
Admission: RE | Admit: 2024-07-06 | Discharge: 2024-07-06 | Disposition: A | Discharge status: Home / Self Care | Payer: Self-pay | Source: Ambulatory Visit | Attending: Radiation Oncology | Admitting: Radiation Oncology

## 2024-07-06 DIAGNOSIS — Z510 Encounter for antineoplastic radiation therapy: Secondary | ICD-10-CM | POA: Diagnosis not present

## 2024-07-06 LAB — RAD ONC ARIA SESSION SUMMARY
Course Elapsed Days: 28
Plan Fractions Treated to Date: 2
Plan Prescribed Dose Per Fraction: 2 Gy
Plan Total Fractions Prescribed: 5
Plan Total Prescribed Dose: 10 Gy
Reference Point Dosage Given to Date: 4 Gy
Reference Point Session Dosage Given: 2 Gy
Session Number: 18

## 2024-07-07 ENCOUNTER — Other Ambulatory Visit: Payer: Self-pay

## 2024-07-07 ENCOUNTER — Ambulatory Visit
Admission: RE | Admit: 2024-07-07 | Discharge: 2024-07-07 | Disposition: A | Discharge status: Home / Self Care | Payer: Self-pay | Source: Ambulatory Visit | Attending: Radiation Oncology | Admitting: Radiation Oncology

## 2024-07-07 DIAGNOSIS — Z510 Encounter for antineoplastic radiation therapy: Secondary | ICD-10-CM | POA: Diagnosis not present

## 2024-07-07 LAB — RAD ONC ARIA SESSION SUMMARY
Course Elapsed Days: 29
Plan Fractions Treated to Date: 3
Plan Prescribed Dose Per Fraction: 2 Gy
Plan Total Fractions Prescribed: 5
Plan Total Prescribed Dose: 10 Gy
Reference Point Dosage Given to Date: 6 Gy
Reference Point Session Dosage Given: 2 Gy
Session Number: 19

## 2024-07-08 ENCOUNTER — Ambulatory Visit
Admission: RE | Admit: 2024-07-08 | Discharge: 2024-07-08 | Disposition: A | Discharge status: Home / Self Care | Payer: Self-pay | Source: Ambulatory Visit | Attending: Radiation Oncology | Admitting: Radiation Oncology

## 2024-07-08 ENCOUNTER — Other Ambulatory Visit: Payer: Self-pay

## 2024-07-08 DIAGNOSIS — Z510 Encounter for antineoplastic radiation therapy: Secondary | ICD-10-CM | POA: Diagnosis not present

## 2024-07-08 LAB — RAD ONC ARIA SESSION SUMMARY
Course Elapsed Days: 30
Plan Fractions Treated to Date: 4
Plan Prescribed Dose Per Fraction: 2 Gy
Plan Total Fractions Prescribed: 5
Plan Total Prescribed Dose: 10 Gy
Reference Point Dosage Given to Date: 8 Gy
Reference Point Session Dosage Given: 2 Gy
Session Number: 20

## 2024-07-09 ENCOUNTER — Ambulatory Visit
Admission: RE | Admit: 2024-07-09 | Discharge: 2024-07-09 | Disposition: A | Discharge status: Home / Self Care | Payer: Self-pay | Source: Ambulatory Visit | Attending: Radiation Oncology | Admitting: Radiation Oncology

## 2024-07-09 ENCOUNTER — Encounter: Payer: Self-pay | Admitting: *Deleted

## 2024-07-09 ENCOUNTER — Other Ambulatory Visit: Payer: Self-pay

## 2024-07-09 DIAGNOSIS — Z510 Encounter for antineoplastic radiation therapy: Secondary | ICD-10-CM | POA: Diagnosis not present

## 2024-07-09 LAB — RAD ONC ARIA SESSION SUMMARY
Course Elapsed Days: 31
Plan Fractions Treated to Date: 5
Plan Prescribed Dose Per Fraction: 2 Gy
Plan Total Fractions Prescribed: 5
Plan Total Prescribed Dose: 10 Gy
Reference Point Dosage Given to Date: 10 Gy
Reference Point Session Dosage Given: 2 Gy
Session Number: 21

## 2024-07-10 NOTE — Radiation Completion Notes (Signed)
 Patient Name: [place], Magnolia MRN: [id number] Date of Birth: 1940/09/25 Referring Physician: ZELPHIA CAP, M.D. Date of Service: 2024-07-10 Radiation Oncologist: Marcey Penton, M.D. [facility] [place]                             RADIATION ONCOLOGY END OF TREATMENT NOTE     Diagnosis: D05.12 Intraductal carcinoma in situ of left breast Staging on 2024-04-20: Ductal carcinoma in situ of breast T=cTis (DCIS), N=cN0, M=cM0 Intent: Curative     HPI: Patient is a 84 year old female who presented with an abnormal mammogram of her left breast.  There were indeterminate left breast calcifications spanning 1.4 cm warranting further Characterization with stereotactic biopsy.  Stereotactic biopsy was performed showing ductal carcinoma in situ ER positive.  She underwent a wide local excision showing DCIS ER positive measuring 11 mm.  There was central expansive commode necrosis noted.  Margins were negative at 6 mm.  No lymph nodes were submitted.  She has done well postoperatively.  Specifically denies breast tenderness cough or bone pain.      ==========DELIVERED PLANS==========  First Treatment Date: 2024-06-08 Last Treatment Date: 2024-07-09   Plan Name: Breast_L Site: Breast, Left Technique: 3D Mode: Photon Dose Per Fraction: 2.66 Gy Prescribed Dose (Delivered / Prescribed): 42.56 Gy / 42.56 Gy Prescribed Fxs (Delivered / Prescribed): 16 / 16   Plan Name: Breast_L_Bst Site: Breast, Left Technique: 3D Mode: Photon Dose Per Fraction: 2 Gy Prescribed Dose (Delivered / Prescribed): 10 Gy / 10 Gy Prescribed Fxs (Delivered / Prescribed): 5 / 5     ==========ON TREATMENT VISIT DATES========== 2024-06-09, 2024-06-09, 2024-06-23, 2024-06-30, 2024-07-07     ==========UPCOMING VISITS========== 08/20/2024 [facility] OFFICE VISIT Marylynn Verneita CROME, MD  08/18/2024 [facility] LAB VISIT [facility]  08/12/2024 [facility] RAD ONCOLOGY FOLLOW UP 30 Penton Marcey,  MD  07/20/2024 [facility] MED ONC EST PT Cap Zelphia, MD        ==========APPENDIX - ON TREATMENT VISIT NOTES==========   See weekly On Treatment Notes in Epic for details in the Media tab (listed as Progress notes on the On Treatment Visit Dates listed above).

## 2024-07-20 ENCOUNTER — Inpatient Hospital Stay: Admitting: Oncology

## 2024-08-12 ENCOUNTER — Ambulatory Visit: Payer: Self-pay | Admitting: Radiation Oncology

## 2024-08-18 ENCOUNTER — Other Ambulatory Visit

## 2024-08-20 ENCOUNTER — Ambulatory Visit: Admitting: Internal Medicine

## 2024-08-27 ENCOUNTER — Inpatient Hospital Stay: Payer: Self-pay | Admitting: Oncology

## 2025-04-06 ENCOUNTER — Ambulatory Visit
